# Patient Record
Sex: Female | Born: 1975 | Race: White | Hispanic: No
Health system: Midwestern US, Community
[De-identification: ages and names within clinical notes are randomized; demographics above are authoritative.]

## PROBLEM LIST (undated history)

## (undated) ENCOUNTER — Ambulatory Visit: Source: Home / Self Care

## (undated) DIAGNOSIS — R928 Other abnormal and inconclusive findings on diagnostic imaging of breast: Secondary | ICD-10-CM

## (undated) DIAGNOSIS — Z1239 Encounter for other screening for malignant neoplasm of breast: Secondary | ICD-10-CM

## (undated) DIAGNOSIS — N63 Unspecified lump in unspecified breast: Secondary | ICD-10-CM

## (undated) DIAGNOSIS — Z139 Encounter for screening, unspecified: Secondary | ICD-10-CM

## (undated) DIAGNOSIS — S92253A Displaced fracture of navicular [scaphoid] of unspecified foot, initial encounter for closed fracture: Secondary | ICD-10-CM

## (undated) DIAGNOSIS — Z1231 Encounter for screening mammogram for malignant neoplasm of breast: Secondary | ICD-10-CM

## (undated) DIAGNOSIS — R0602 Shortness of breath: Secondary | ICD-10-CM

## (undated) DIAGNOSIS — F32A Depression, unspecified: Secondary | ICD-10-CM

## (undated) DIAGNOSIS — G250 Essential tremor: Secondary | ICD-10-CM

## (undated) DIAGNOSIS — O24419 Gestational diabetes mellitus in pregnancy, unspecified control: Secondary | ICD-10-CM

## (undated) DIAGNOSIS — F419 Anxiety disorder, unspecified: Secondary | ICD-10-CM

## (undated) DIAGNOSIS — F329 Major depressive disorder, single episode, unspecified: Secondary | ICD-10-CM

## (undated) DIAGNOSIS — E559 Vitamin D deficiency, unspecified: Secondary | ICD-10-CM

## (undated) DIAGNOSIS — R5383 Other fatigue: Secondary | ICD-10-CM

## (undated) DIAGNOSIS — M2550 Pain in unspecified joint: Secondary | ICD-10-CM

## (undated) DIAGNOSIS — G43909 Migraine, unspecified, not intractable, without status migrainosus: Secondary | ICD-10-CM

## (undated) DIAGNOSIS — T7840XA Allergy, unspecified, initial encounter: Secondary | ICD-10-CM

## (undated) DIAGNOSIS — M654 Radial styloid tenosynovitis [de Quervain]: Secondary | ICD-10-CM

## (undated) DIAGNOSIS — S92909A Unspecified fracture of unspecified foot, initial encounter for closed fracture: Secondary | ICD-10-CM

## (undated) DIAGNOSIS — J45909 Unspecified asthma, uncomplicated: Secondary | ICD-10-CM

## (undated) HISTORY — DX: Pain in unspecified joint: M25.50

## (undated) HISTORY — DX: Other fatigue: R53.83

## (undated) HISTORY — DX: Unspecified asthma, uncomplicated: J45.909

## (undated) HISTORY — DX: Radial styloid tenosynovitis (de quervain): M65.4

## (undated) HISTORY — DX: Essential tremor: G25.0

## (undated) HISTORY — DX: Migraine, unspecified, not intractable, without status migrainosus: G43.909

## (undated) HISTORY — DX: Gestational diabetes mellitus in pregnancy, unspecified control: O24.419

## (undated) HISTORY — PX: TUBAL LIGATION: SHX77

## (undated) HISTORY — DX: Vitamin D deficiency, unspecified: E55.9

## (undated) HISTORY — DX: Anxiety disorder, unspecified: F41.9

## (undated) HISTORY — DX: Shortness of breath: R06.02

## (undated) HISTORY — PX: KNEE SURGERY: SHX244

## (undated) HISTORY — DX: Depression, unspecified: F32.A

## (undated) HISTORY — PX: WISDOM TOOTH EXTRACTION: SHX21

## (undated) HISTORY — DX: Major depressive disorder, single episode, unspecified: F32.9

## (undated) HISTORY — DX: Allergy, unspecified, initial encounter: T78.40XA

## (undated) HISTORY — DX: Unspecified fracture of unspecified foot, initial encounter for closed fracture: S92.909A

---

## 2000-07-08 ENCOUNTER — Other Ambulatory Visit: Admission: RE | Admit: 2000-07-08 | Discharge: 2000-07-08 | Payer: Self-pay | Admitting: Obstetrics and Gynecology

## 2000-08-30 ENCOUNTER — Inpatient Hospital Stay (HOSPITAL_COMMUNITY): Admission: AD | Admit: 2000-08-30 | Discharge: 2000-08-30 | Payer: Self-pay | Admitting: Obstetrics & Gynecology

## 2000-09-09 ENCOUNTER — Inpatient Hospital Stay (HOSPITAL_COMMUNITY): Admission: AD | Admit: 2000-09-09 | Discharge: 2000-09-14 | Payer: Self-pay | Admitting: Obstetrics & Gynecology

## 2000-09-20 ENCOUNTER — Inpatient Hospital Stay (HOSPITAL_COMMUNITY): Admission: AD | Admit: 2000-09-20 | Discharge: 2000-09-20 | Payer: Self-pay | Admitting: Obstetrics & Gynecology

## 2001-10-04 ENCOUNTER — Encounter: Admission: RE | Admit: 2001-10-04 | Discharge: 2001-10-04 | Payer: Self-pay | Admitting: Obstetrics and Gynecology

## 2001-12-15 ENCOUNTER — Inpatient Hospital Stay (HOSPITAL_COMMUNITY): Admission: AD | Admit: 2001-12-15 | Discharge: 2001-12-18 | Payer: Self-pay | Admitting: Obstetrics and Gynecology

## 2001-12-15 ENCOUNTER — Encounter (INDEPENDENT_AMBULATORY_CARE_PROVIDER_SITE_OTHER): Payer: Self-pay

## 2003-01-24 ENCOUNTER — Other Ambulatory Visit: Admission: RE | Admit: 2003-01-24 | Discharge: 2003-01-24 | Payer: Self-pay | Admitting: Obstetrics and Gynecology

## 2004-10-23 ENCOUNTER — Other Ambulatory Visit: Admission: RE | Admit: 2004-10-23 | Discharge: 2004-10-23 | Payer: Self-pay | Admitting: Obstetrics and Gynecology

## 2005-11-13 ENCOUNTER — Other Ambulatory Visit: Admission: RE | Admit: 2005-11-13 | Discharge: 2005-11-13 | Payer: Self-pay | Admitting: Obstetrics and Gynecology

## 2007-07-24 ENCOUNTER — Emergency Department (HOSPITAL_COMMUNITY): Admission: EM | Admit: 2007-07-24 | Discharge: 2007-07-24 | Payer: Self-pay | Admitting: Emergency Medicine

## 2011-04-11 NOTE — Discharge Summary (Signed)
[facility] of [facility]  Patient:    LANIAH, GRIMM Visit Number: [id number] MRN: [id number]          Service Type: OBS Location: 910A [id number] [id number] Attending Physician:  Melony Overly Dictated by:   Caralyn Guile Arlyce Dice, M.D. Admit Date:  12/15/2001 Discharge Date: 12/18/2001                             Discharge Summary  FINAL DIAGNOSES:              1. Intrauterine gestation at 38 weeks and                                  6 days.                               2. History of previous cesarean section with                                  desire for repeat cesarean section.                               3. Desire for permanent sterilization.                               4. Gestational diabetes.  SECONDARY DIAGNOSES:          None.  PROCEDURES:                   1. Repeat low transverse cesarean section.                               2. Bilateral tubal ligation.  COMPLICATIONS:                None.  CONDITION ON DISCHARGE:       Improved.  HISTORY OF PRESENT ILLNESS:   This is a 35 year old, gravida 2, para 1, who had a previous cesarean section for failure to progress.  The option of vaginal birth after cesarean section was discussed with the patient, who elected to proceed with the repeat cesarean section and also requested that permanent sterilization be performed at that time.  The patients antepartum course was complicated by gestational diabetes, but this was well controlled with diet.  HOSPITAL COURSE:              The patient was brought to the operating room on the day of admission by [facility] A. Edward Jolly, M.D., where a repeat low transverse cesarean section and bilateral partial salpingectomy for sterilization were performed without complication.  An 8 pound 6 ounce female infant with Apgar scores of 8 and 9 was delivered.  The patients postoperative course was benign without significant fever or anemia.  On the third postoperative day, the patient  was ambulating well, was without problems, and was felt to be ready for discharge.  DIET:                         She was discharged on a regular diet.  ACTIVITY:                     Told to limit her activity.  DISCHARGE MEDICATIONS:        She was given Tylox 30 tablets to take one to two every four hours for pain.  Asked to take her prenatal vitamins.  FOLLOW-UP:                    To return to the office in four weeks for her follow-up evaluation.  LABORATORY DATA:              Admission 10.0 with a white count of 8900. Discharge hemoglobin 7.5 with a white count of 12.9. Dictated by:   Caralyn Guile Arlyce Dice, M.D. Attending Physician:  Melony Overly DD:  01/03/02 TD:  01/03/02 Job: [id number]

## 2011-04-11 NOTE — Discharge Summary (Signed)
[facility] of [facility]  Patient:    Sarah Fields, Sarah Fields                   MRN: [id number] Adm. Date:  [id number] Disc. Date: [id number] Attending:  Mickle Mallory Dictator:   Leilani Able, P.A.                           Discharge Summary  FINAL DIAGNOSES:              1. Intrauterine pregnancy at 41-6/[redacted] weeks                                  gestation.                               2. Failure to progress.  PROCEDURE:                    Primary low transverse cesarean section.  SURGEON:                      Brook A. Edward Jolly, M.D.  COMPLICATIONS:                None.  HOSPITAL COURSE:              This 35 year old G1, P0 presents at 41-6/[redacted] weeks gestation for induction secondary to post dates status.  The patient had had nonstress test and fluid checks in the office.  They have all been normal. The patient was admitted on September 09, 2000.  At this time she received a dose of Cytotec which was followed by low dose Pitocin.  Upon admission, the patients cervix was only fingertip dilated and -2 to -3 station. The patient went on to develop an adequate labor pattern but she did not change her cervix with only 5 cm dilated, 90% effaced and -3 station. At this point there was recurrent bradycardia during her labor which was attributed to hypotension treated with ephedrine.  A discussion was made with the patient regarding her failure to progress. At this point it was recommended to proceed with a cesarean section.  The patient was taken to the operating room by Dr. Conley Simmonds where a primary low transverse cesarean section was performed with the delivery of an 8 pound 2 ounce female infant with Apgars of 8 and 9.  Delivery went without complications.  The patients postoperative course was benign without significant fevers. The patient was having some problems with breast-feeding and therefore was kept until postoperative day #4.  By postoperative day #4 she  was feeling a lot more comfortable with the nursing.  DISPOSITION:                  She was sent home on a regular diet, told to decrease activities, told to continue prenatal vitamins, was given FESO4 325 mg one b.i.d. and Tylox #30 one every four hours as needed for pain.  She was also told to use Motrin as needed and to follow up in the office in four weeks. DD:  11/02/00 TD:  11/02/00 Job: [id number]

## 2011-04-11 NOTE — Op Note (Signed)
[facility] of [facility]  Patient:    Sarah Fields, Sarah Fields Visit Number: [id number] MRN: [id number]          Service Type: OBS Location: [facility] Attending Physician:  Melony Overly Dictated by:   Devoria Albe Edward Jolly, M.D. Proc. Date: 12/15/01                             Operative Report  PREOPERATIVE DIAGNOSES:       1. Intrauterine gestation at 38+6 weeks.                               2. History of cesarean section, desire for                                  repeat cesarean section.                               3. Desire for permanent sterilization.                               4. Gestational diabetes mellitus.  POSTOPERATIVE DIAGNOSES:      1. Intrauterine gestation at 38+6 weeks.                               2. History of cesarean section, desire for                                  repeat cesarean section.                               3. Desire for permanent sterilization.                               4. Gestational diabetes mellitus.  PROCEDURE:                   Repeat low segment transverse cesarean section with bilateral tubal ligation.  SURGEON:                      Brook A. Edward Jolly, M.D.  ANESTHESIA:                   Spinal.  IV FLUIDS:                    3100 cc Ringers lactate.  ESTIMATED BLOOD LOSS:         850 cc.  URINE OUTPUT:                 200 cc.  COMPLICATIONS:                None.  INDICATIONS FOR PROCEDURE:    The patient was a 35 year old gravida 2, para 1-0-0-1 Caucasian female with a history of a prior cesarean section in 2001 for failure to progress and who, during her current pregnancy, expressed an interest in having a repeat cesarean section and permanent sterilization performed.  The patients antepartum course had been significant for gestational diabetes, which was diet controlled.  The patient chose to proceed with her surgery after the risks, benefits and alternatives were discussed with her.  The patient was quoted a  failure rate of approximately 7 per 1000 for the tubal ligation and she was told that this may result in either an intrauterine or an ectopic pregnancy.  FINDINGS:                     A viable female infant was delivered at 9:44 a.m. with Apgars of 9 at one minute and 9 at five minutes.  Weight was noted to be 8 lb 6 oz.  Amniotic fluid was noted to be clear.  The newborn had no gross abnormalities appreciated.  The bilateral tubes and ovaries were normal, as was the uterus.  The placenta had a normal insertion of a three-vessel cord.  SPECIMENS:                    Portions of the right and left fallopian tubes were sent to pathology.  DESCRIPTION OF PROCEDURE:     With an IV in place, the patient was taken to the operating room after she was properly identified.  The patient received a spinal anesthetic and was then placed in the supine position with a left lateral tilt.  The patients abdomen was sterilely prepped and a Foley catheter was sterilely placed inside the bladder.  She was then sterilely draped.  After adequate anesthesia was ensured, a Pfannenstiel incision was created sharply with a scalpel.  The incision was carried down through the subcutaneous tissue using monopolar cautery.  Small bleeding vessels were cauterized with monopolar cautery instrument.  The fascia was then scored in the midline with a scalpel and the incision was carried out bilaterally with Mayo scissors.   The fascia was separated from the underlying rectus muscles using sharp dissection with Mayo scissors.  The rectus muscles were then separated in the midline with a hemostat clamp and the incision was extended with Mayo scissors inferiorly.  The peritoneum was then grasped with two hemostat clamps and it was entered sharply with Metzenbaum scissors.  The incision was extended cranially and caudally using the same.  The lower uterine segment was exposed with a bladder retractor and a bladder flap was  created sharply with Metzenbaum scissors.  A transverse lower uterine segment incision was then created with a scalpel and the uterine cavity was then entered bluntly using a Kelly clamp.  The inch was then extended bluntly and an Allis clamp was used to rupture membranes.  A hand was then inserted through the uterine incision and the vertex was noted to be in the occiput posterior position.  The vertex was delivered, followed by the remainder of the infant, at which time the nares and mouth were suctioned.  The cord was then doubly clamped and cut and the newborn was carried over to the awaiting pediatricians.  Cord blood was obtained and the placenta was then manually extracted.  The patient then received clindamycin 900 mg intravenously and Pitocin 20 units IV.  The uterus was exteriorized for closure.  A moistened lap pad was used to remove any remaining products of conception from within the uterine cavity, and there were none.  The uterus was closed with a single running locked suture of #1 chromic.  Hemostasis was excellent.  A tubal ligation was performed at this time.  The  left fallopian tube was grasped with a Babcock clamp and followed all the way to the fimbriated end. A double tie of 0 plain was then placed around a knuckle of tissue and the intervening portion was sharply excised and sent to pathology.  Hemostasis was excellent.  The same procedure that was performed on the patients left-hand side was then repeated on the right fallopian tube after it was followed to its fimbriated end.  The uterus was then returned to the peritoneal cavity and the operative sites were reexamined and found to be hemostatic.  The peritoneum was closed with a running suture of #3 plain.  The rectus muscles were brought together in the midline using interrupted sutures of #1 chromic.  The fascia was closed with a running suture of 0 Vicryl.  The subcutaneous tissue was irrigated with  normal saline and was then suctioned.  The subcutaneous tissue was then cauterized in areas of small oozing vessels and hemostasis was excellent.  Interrupted  sutures of 3-0 plain were placed in the subcutaneous layer, followed by staples on the skin.  A sterile bandage was placed over this.  The uterus was expressed of remaining clots and the patient was cleansed of the remaining Betadine.  She was escorted to the recovery room in stable and awake condition.  There were no complications to the procedure.  All sponge, needle and instrument counts were correct. Dictated by:   Devoria Albe Edward Jolly, M.D. Attending Physician:  Melony Overly DD:  12/15/01 TD:  12/16/01 Job: [id number]

## 2011-04-11 NOTE — Op Note (Signed)
[facility] of [facility]  Patient:    GENIYAH, EISCHEID                   MRN: [id number] Proc. Date: 09/10/00 Adm. Date:  [id number] Attending:  Mickle Mallory                           Operative Report  PREOPERATIVE DIAGNOSIS:       Intrauterine pregnancy at 41 plus [redacted] weeks                               gestation, failure to progress.  POSTOPERATIVE DIAGNOSIS:      Intrauterine pregnancy at 41 plus [redacted] weeks                               gestation, failure to progress.  OPERATION:                    Primary low segment transverse cesarean section.  SURGEON:                      Brook A. Edward Jolly, M.D.  ANESTHESIA:                   Epidural.  IV FLUIDS:                    1700 cc of Ringers lactate.  ESTIMATED BLOOD LOSS:         750 cc.  URINE OUTPUT:                 350 cc.  COMPLICATIONS:                None.  INDICATIONS FOR PROCEDURE:    The patient was a 35 year old gravida 1, para 0, at 51 plus [redacted] weeks gestation ([facility] August 28, 2000) by last menstrual period, who was scheduled for an induction based on her post dates status.  The patient has been followed with NSTs and fluid checks in the office, and the fetal testing was reassuring with an amniotic fluid level of 10.3 on September 07, 2000.  The patient was admitted on September 09, 2000, at which time, she received a dose of Cytotec which was followed by low dose Pitocin.  The cervix at that time was fingertip and with the vertex at the minus 2 to 3 station. The fetal heart rate tracing was reassuring.                                The patient went on to develop adequate labor, but she did not change her cervix beyond 5 cm in dilatation with 90% effacement, and the vertex at the minus 3 station.  The patient did have recurrent bradycardia during her labor which was attributed to hypotension treated with ephedrine.  A discussion was held with the patient regarding the failure to progress  despite adequate labor monitored by an IUPC with adequate amount of the daily units, and the recommendation was to proceed with a primary low segment transverse cesarean section after the risks and benefits were reviewed with the patient and her mother.  They chose to proceed.  FINDINGS:  A viable female infant was born at 37 with Apgars of 8 at one minute and 9 at five minutes.  The weight was 8 pounds and 2 ounces, and two nuchal cords were noted.  One nuchal cord was tight and the other was loose.  The placenta had a normal insertion of a three vessel cord and was noted to be intact.  The uterus, tubes, and ovaries were normal.  A cord pH was measured at 7.28 which was prompted by a variable deceleration to 90s and 100s, just prior to creation of the incision on the skin.  DESCRIPTION OF PROCEDURE:     With an IV, epidural catheter, and a Foley catheter in place, the patient was escorted to the operating room suite from her labor and delivery room.  The patient was dosed through her epidural catheter and was found to have adequate anesthesia.  She was placed in the supine position.  The fetal heart rate was checked and was noted to be in the 90-100 range.  The patients abdomen was rapidly prepped and draped, and a Pfannenstiel incision was created sharply with a scalpel.  This was carried down to the fascia using the same.  The fascia was then scored in the midline with the scalpel, and the incision was carried out bilaterally with the Mayo scissors.  The rectus muscles were separated from the overlying fascia using sharp dissection with the Mayo scissors, and the peritoneum was entered sharply with the Metzenbaum scissors after it was elevated with two snap clamps.  The peritoneal incision was extended superiorly and inferiorly using the Metzenbaum scissors, and the bladder retractor was placed over the bladder.  A bladder flap was created sharply with the Metzenbaum  scissors, and the uterus was incised transversely with the scalpel.  The incision was extended bilaterally in an upward fashion with a bandaged scissors.  A hand was inserted through the uterine incision, and the vertex was delivered without difficulty.  The nuchal cord x 2 was reduced, and the remainder of the infant was delivered.  The cord was doubly clamped and cut after the nares and mouth were suctioned of amniotic fluid.  The infant was carried over to the awaiting pediatricians.  He was noted to be vigorous at birth.                                A cord pH and cord blood was obtained at this time, and the placenta was then manually extracted.  The patient received clindamycin 900 mg IV and Pitocin 20 mg IV.  A moistened lap pad was used to remove any remaining membranes from within the uterine cavity, and the uterus was closed with a single layer of a running locked suture of #1 chromic. There was some additional bleeding in the midportion of the incision which responded well to a figure-of-eight suture.  The uterus, which had been exteriorized, was then returned to the peritoneal cavity which was irrigated and suctioned of crystalloid solution.  The incision was reexamined along the uterus, and it was found to be hemostatic.  The fascial incisions were then examined, and there was no evidence of any ongoing bleeding noted.  The fascia was therefore closed with a running suture of 0 Vicryl.  The subcutaneous tissue was irrigated and suctioned of remaining fluid, and the subcutaneous layer was closed with interrupted sutures of 3-0 plain.  The skin was closed with staples.  A sterile bandage was placed over the incision and the uterus was expressed of any remaining clots.                                The patient was then escorted to the recovery room in stable and awake condition.  There were no complications to the procedure.  All needle, instrument, and sponge counts were  correct. DD:  09/10/00 TD:  09/11/00 Job: [id number]

## 2011-06-11 ENCOUNTER — Other Ambulatory Visit: Payer: Self-pay | Admitting: Family Medicine

## 2011-06-11 ENCOUNTER — Ambulatory Visit
Admission: RE | Admit: 2011-06-11 | Discharge: 2011-06-11 | Disposition: A | Discharge status: Home / Self Care | Payer: BC Managed Care – PPO | Source: Ambulatory Visit | Attending: Family Medicine | Admitting: Family Medicine

## 2011-06-11 DIAGNOSIS — R059 Cough, unspecified: Secondary | ICD-10-CM

## 2011-06-11 DIAGNOSIS — R05 Cough: Secondary | ICD-10-CM

## 2012-05-18 ENCOUNTER — Encounter

## 2012-10-02 ENCOUNTER — Ambulatory Visit (INDEPENDENT_AMBULATORY_CARE_PROVIDER_SITE_OTHER): Payer: BC Managed Care – PPO | Admitting: Physician Assistant

## 2012-10-02 VITALS — BP 132/78 | HR 77 | Temp 98.1°F | Resp 18 | Ht 63.0 in | Wt 247.0 lb

## 2012-10-02 DIAGNOSIS — G43909 Migraine, unspecified, not intractable, without status migrainosus: Secondary | ICD-10-CM

## 2012-10-02 DIAGNOSIS — R112 Nausea with vomiting, unspecified: Secondary | ICD-10-CM

## 2012-10-02 MED ORDER — KETOROLAC TROMETHAMINE 60 MG/2ML IM SOLN
60.0000 mg | Freq: Once | INTRAMUSCULAR | Status: AC
  Administered 2012-10-02: 60 mg via INTRAMUSCULAR

## 2012-10-02 MED ORDER — PROMETHAZINE HCL 25 MG/ML IJ SOLN
25.0000 mg | Freq: Once | INTRAMUSCULAR | Status: AC
  Administered 2012-10-02: 25 mg via INTRAMUSCULAR

## 2012-10-02 MED ORDER — ONDANSETRON 8 MG PO TBDP: 8.0000 mg | ORAL_TABLET | Freq: Three times a day (TID) | ORAL | Status: DC | PRN

## 2012-10-02 NOTE — Progress Notes (Signed)
  Subjective:    Patient ID: Sarah Fields, female    DOB: [birth year], 36 y.o.   MRN: [id number]  HPI 36 year old female presents with migraine headache. States symptoms started today - she took 1 dose of Maxalt that she subsequently threw up.  Headache has persisted and does feel worse now.  Admits that this feels like her typical migraines but slightly more severe.  No vision changes, dizziness, chest pain, SOB, paresthesias, weakness or syncope.  Does have nausea with vomiting, photophobia, and phonophobia.      Review of Systems  Constitutional: Negative for fever and chills.  HENT: Negative for neck pain.   Eyes: Negative for visual disturbance.  Respiratory: Negative for cough, shortness of breath and wheezing.   Cardiovascular: Negative for chest pain.  Gastrointestinal: Positive for nausea and vomiting.  Skin: Negative for rash.  Neurological: Negative for dizziness, syncope, weakness, light-headedness and numbness.  All other systems reviewed and are negative.       Objective:   Physical Exam  Constitutional: She is oriented to person, place, and time. She appears well-developed and well-nourished.  HENT:  Head: Normocephalic and atraumatic.  Right Ear: External ear normal.  Left Ear: External ear normal.  Eyes: Conjunctivae normal and EOM are normal. Pupils are equal, round, and reactive to light.  Neck: Normal range of motion. Neck supple.  Cardiovascular: Normal rate, regular rhythm and normal heart sounds.   Pulmonary/Chest: Effort normal and breath sounds normal.  Musculoskeletal: Normal range of motion.  Lymphadenopathy:    She has no cervical adenopathy.  Neurological: She is alert and oriented to person, place, and time. No cranial nerve deficit or sensory deficit. Coordination normal.  Psychiatric: She has a normal mood and affect. Her behavior is normal. Judgment and thought content normal.          Assessment & Plan:   1. Nausea with vomiting   ondansetron (ZOFRAN-ODT) 8 MG disintegrating tablet, promethazine (PHENERGAN) injection 25 mg  2. Migraine  ketorolac (TORADOL) injection 60 mg   Toradol and phenergan given today in office Zofran prn nausea  Maxalt if needed Follow up here or with PCP or go to ER with any worsening symptoms, or if she fail to improve.

## 2014-01-17 ENCOUNTER — Encounter

## 2014-02-03 ENCOUNTER — Encounter

## 2014-02-04 ENCOUNTER — Ambulatory Visit (INDEPENDENT_AMBULATORY_CARE_PROVIDER_SITE_OTHER): Payer: BC Managed Care – PPO | Admitting: Emergency Medicine

## 2014-02-04 VITALS — BP 118/80 | HR 103 | Temp 98.3°F | Resp 18 | Ht 65.0 in | Wt 274.0 lb

## 2014-02-04 DIAGNOSIS — J111 Influenza due to unidentified influenza virus with other respiratory manifestations: Secondary | ICD-10-CM

## 2014-02-04 MED ORDER — PROMETHAZINE-CODEINE 6.25-10 MG/5ML PO SYRP: 5.0000 mL | ORAL_SOLUTION | Freq: Four times a day (QID) | ORAL | Status: DC | PRN

## 2014-02-04 MED ORDER — PSEUDOEPHEDRINE-GUAIFENESIN ER 60-600 MG PO TB12: 1.0000 | ORAL_TABLET | Freq: Two times a day (BID) | ORAL | Status: DC

## 2014-02-04 NOTE — Patient Instructions (Signed)

## 2014-02-04 NOTE — Progress Notes (Signed)
[facility] [address], [place] [id number] [phone]  Date:  02/04/2014   Name:  Sarah LinerJennifer R Fields   DOB:  Mar 28, 1976   MRN:  [id number]  PCP:  Allean FoundSMITH,CANDACE THIELE, MD    Chief Complaint: Sore Throat, Laryngitis, sinus congestion and Facial Pain   History of Present Illness:  Sarah LinerJennifer R Fields is a 38 y.o. very pleasant female patient who presents with the following:  Ill for over a week with nasal congestion and drainage that is primarily mucoid.  Has sinus pressure and post nasal drip.  Headache.  Has sore throat.  Fever and no chills. Malaise , myalgias and fatigue.  Has a non productive cough.  No wheezing or shortness of breath.  No nausea or vomiting.  No stool change or rash.  No improvement with over the counter medications or other home remedies. Denies other complaint or health concern today.   There are no active problems to display for this patient.   Past Medical History  Diagnosis Date  . Allergy   . Anxiety   . Asthma   . Depression     Past Surgical History  Procedure Laterality Date  . Cesarean section    . Knee surgery      History  Substance Use Topics  . Smoking status: Never Smoker   . Smokeless tobacco: Not on file  . Alcohol Use: No    Family History  Problem Relation Age of Onset  . Hyperlipidemia Mother   . Seizures Father   . Stroke Maternal Grandmother   . Cancer Maternal Grandfather   . Stroke Paternal Grandfather     Allergies  Allergen Reactions  . Latex   . Other     Pine apples, strawberries, banannas  . Penicillins     Medication list has been reviewed and updated.  Current Outpatient Prescriptions on File Prior to Visit  Medication Sig Dispense Refill  . albuterol (PROVENTIL HFA;VENTOLIN HFA) 108 (90 BASE) MCG/ACT inhaler Inhale 2 puffs into the lungs every 6 (six) hours as needed.      . ondansetron (ZOFRAN-ODT) 8 MG disintegrating tablet Take 1 tablet (8 mg total) by mouth every 8  (eight) hours as needed for nausea.  30 tablet  0  . rizatriptan (MAXALT) 10 MG tablet Take 10 mg by mouth as needed. May repeat in 2 hours if needed      . FLUoxetine (PROZAC) 10 MG capsule Take 10 mg by mouth daily.       No current facility-administered medications on file prior to visit.    Review of Systems:  As per HPI, otherwise negative.    Physical Examination: Filed Vitals:   02/04/14 1439  BP: 118/80  Pulse: 103  Temp: 98.3 F (36.8 C)  Resp: 18   Filed Vitals:   02/04/14 1439  Height: 5\' 5"  (1.651 m)  Weight: 274 lb (124.286 kg)   Body mass index is 45.6 kg/(m^2). Ideal Body Weight: Weight in (lb) to have BMI = 25: 149.9  GEN: morbid obesity, NAD, Non-toxic, A & O x 3 HEENT: Atraumatic, Normocephalic. Neck supple. No masses, No LAD. Ears and Nose: No external deformity. CV: RRR, No M/G/R. No JVD. No thrill. No extra heart sounds. PULM: CTA B, no wheezes, crackles, rhonchi. No retractions. No resp. distress. No accessory muscle use. ABD: S, NT, ND, +BS. No rebound. No HSM. EXTR: No c/c/e NEURO Normal gait.  PSYCH: Normally interactive. Conversant. Not depressed or anxious appearing.  Calm demeanor.    Assessment and Plan: Influenza Too lat for tamiflu mucinex d Phen c cod   Signed,  Phillips Odor, MD

## 2014-05-10 ENCOUNTER — Telehealth: Payer: Self-pay | Admitting: Obstetrics and Gynecology

## 2014-05-10 NOTE — Telephone Encounter (Signed)
Confirming pts appt °

## 2014-05-15 NOTE — Telephone Encounter (Signed)
Lm with daughter to have pt call back

## 2014-05-18 ENCOUNTER — Encounter: Payer: Self-pay | Admitting: Obstetrics and Gynecology

## 2014-05-18 ENCOUNTER — Ambulatory Visit (INDEPENDENT_AMBULATORY_CARE_PROVIDER_SITE_OTHER): Payer: BC Managed Care – PPO | Admitting: Obstetrics and Gynecology

## 2014-05-18 VITALS — BP 124/82 | HR 84 | Resp 14 | Ht 64.75 in | Wt 279.0 lb

## 2014-05-18 DIAGNOSIS — Z01419 Encounter for gynecological examination (general) (routine) without abnormal findings: Secondary | ICD-10-CM

## 2014-05-18 DIAGNOSIS — Z0000 Encounter for general adult medical examination without abnormal findings: Secondary | ICD-10-CM

## 2014-05-18 DIAGNOSIS — N920 Excessive and frequent menstruation with regular cycle: Secondary | ICD-10-CM

## 2014-05-18 LAB — COMPREHENSIVE METABOLIC PANEL
ALT: 9 U/L (ref 0–35)
AST: 13 U/L (ref 0–37)
Albumin: 4 g/dL (ref 3.5–5.2)
Alkaline Phosphatase: 85 U/L (ref 39–117)
BILIRUBIN TOTAL: 0.4 mg/dL (ref 0.2–1.2)
BUN: 10 mg/dL (ref 6–23)
CALCIUM: 9 mg/dL (ref 8.4–10.5)
CHLORIDE: 103 meq/L (ref 96–112)
CO2: 26 meq/L (ref 19–32)
CREATININE: 0.75 mg/dL (ref 0.50–1.10)
Glucose, Bld: 82 mg/dL (ref 70–99)
Potassium: 4.6 mEq/L (ref 3.5–5.3)
SODIUM: 141 meq/L (ref 135–145)
TOTAL PROTEIN: 6.6 g/dL (ref 6.0–8.3)

## 2014-05-18 LAB — CBC
HCT: 39.1 % (ref 36.0–46.0)
Hemoglobin: 12.9 g/dL (ref 12.0–15.0)
MCH: 29.1 pg (ref 26.0–34.0)
MCHC: 33 g/dL (ref 30.0–36.0)
MCV: 88.3 fL (ref 78.0–100.0)
PLATELETS: 276 10*3/uL (ref 150–400)
RBC: 4.43 MIL/uL (ref 3.87–5.11)
RDW: 13.5 % (ref 11.5–15.5)
WBC: 9.4 10*3/uL (ref 4.0–10.5)

## 2014-05-18 LAB — POCT URINALYSIS DIPSTICK
Bilirubin, UA: NEGATIVE
Glucose, UA: NEGATIVE
Ketones, UA: NEGATIVE
Leukocytes, UA: NEGATIVE
NITRITE UA: NEGATIVE
PH UA: 5
PROTEIN UA: NEGATIVE
RBC UA: NEGATIVE
UROBILINOGEN UA: NEGATIVE

## 2014-05-18 LAB — LIPID PANEL
CHOL/HDL RATIO: 4.4 ratio
Cholesterol: 171 mg/dL (ref 0–200)
HDL: 39 mg/dL — ABNORMAL LOW (ref >39)
LDL CALC: 83 mg/dL (ref 0–99)
Triglycerides: 245 mg/dL — ABNORMAL HIGH (ref <150)
VLDL: 49 mg/dL — AB (ref 0–40)

## 2014-05-18 LAB — HEMOGLOBIN, FINGERSTICK: Hemoglobin, fingerstick: 12.9 g/dL (ref 12.0–16.0)

## 2014-05-18 NOTE — Progress Notes (Signed)
Patient ID: Sarah LinerJennifer R Fields, female   DOB: [birth year], 38 y.o.   MRN: [id number] GYNECOLOGY VISIT  PCP:  Serita ShellerVyvyaan Sun, MD  Referring provider:   HPI: 38 y.o.   Married  Caucasian  female   G2P2002 with Patient's last menstrual period was 05/08/2014.   here for  AEX.  Notes something near anal opening.  Worsens with straining to have BMs.  Heavy but regular menses.  Back pain for first two days.  Pad and tampon use with change every 1.5 - 2 hours.  Bleeding through clothing at work. Clotting.  No known fibroids.   Took birth control in the past without problems.   Has migraine with aura.   Hgb:    12.9 Urine:  Neg  GYNECOLOGIC HISTORY: Patient's last menstrual period was 05/08/2014. Sexually active:  yes Partner preference: female Contraception:   Tubal Menopausal hormone therapy: n/a DES exposure:   no Blood transfusions:  no  Sexually transmitted diseases:   no GYN procedures and prior surgeries:  C-section, Tubal Last mammogram:  n/a               Last pap and high risk HPV testing:  2012 wnl  History of abnormal pap smear:  2000 had colposcopy but no treatment to cervix.  Repeat pap normal.  Paps normal since.   OB History   Grav Para Term Preterm Abortions TAB SAB Ect Mult Living   2 2 2       2        LIFESTYLE: Exercise:  no             Tobacco:  no Alcohol:   no Drug use:  no  OTHER HEALTH MAINTENANCE: Tetanus/TDap:  Up to date with PCP Gardisil:               n/a Influenza:            08/2013 Zostavax:             n/a  Bone density:       n/a Colonoscopy:       n/a  Cholesterol check:   Not lately  Family History  Problem Relation Age of Onset  . Hyperlipidemia Mother   . Migraines Mother   . Thyroid disease Mother   . Seizures Father   . Hypertension Father   . Stroke Maternal Grandmother   . Cancer Maternal Grandfather   . Migraines Maternal Grandfather   . Stroke Paternal Grandfather     There are no active problems to display  for this patient.  Past Medical History  Diagnosis Date  . Allergy   . Anxiety   . Asthma   . Depression   . Migraines     Past Surgical History  Procedure Laterality Date  . Cesarean section    . Knee surgery    . Tubal ligation      ALLERGIES: Latex; Other; and Penicillins  Current Outpatient Prescriptions  Medication Sig Dispense Refill  . albuterol (PROVENTIL HFA;VENTOLIN HFA) 108 (90 BASE) MCG/ACT inhaler Inhale 2 puffs into the lungs every 6 (six) hours as needed.      . Fluticasone-Salmeterol (ADVAIR DISKUS IN) Inhale into the lungs.      . montelukast (SINGULAIR) 10 MG tablet Take 10 mg by mouth at bedtime.      . rizatriptan (MAXALT) 10 MG tablet Take 10 mg by mouth as needed. May repeat in 2 hours if needed       No  current facility-administered medications for this visit.     ROS:  Pertinent items are noted in HPI.  SOCIAL HISTORY:  Married. Children are 112 daughter and 40 year old son.  PHYSICAL EXAMINATION:    BP 124/82  Pulse 84  Resp 14  Ht 5' 4.75" (1.645 m)  Wt 279 lb (126.554 kg)  BMI 46.77 kg/m2  LMP 05/08/2014   Wt Readings from Last 3 Encounters:  05/18/14 279 lb (126.554 kg)  02/04/14 274 lb (124.286 kg)  10/02/12 247 lb (112.038 kg)     Ht Readings from Last 3 Encounters:  05/18/14 5' 4.75" (1.645 m)  02/04/14 5\' 5"  (1.651 m)  10/02/12 5\' 3"  (1.6 m)    General appearance: alert, cooperative and appears stated age Head: Normocephalic, without obvious abnormality, atraumatic Neck: no adenopathy, supple, symmetrical, trachea midline and thyroid not enlarged, symmetric, no tenderness/mass/nodules Lungs: clear to auscultation bilaterally Breasts: Inspection negative, No nipple retraction or dimpling, No nipple discharge or bleeding, No axillary or supraclavicular adenopathy, Normal to palpation without dominant masses Heart: regular rate and rhythm Abdomen: soft, non-tender; no masses,  no organomegaly Extremities: extremities normal,  atraumatic, no cyanosis or edema Skin: Skin color, texture, turgor normal. No rashes or lesions Lymph nodes: Cervical, supraclavicular, and axillary nodes normal. No abnormal inguinal nodes palpated Neurologic: Grossly normal  Pelvic: External genitalia:  no lesions              Urethra:  normal appearing urethra with no masses, tenderness or lesions              Bartholins and Skenes: normal                 Vagina: normal appearing vagina with normal color and discharge, no lesions              Cervix: normal appearance              Pap and high risk HPV testing done: yes.            Bimanual Exam:  Uterus:  uterus is normal size, shape, consistency and nontender                                      Adnexa: normal adnexa in size, nontender and no masses                                      Rectovaginal: Confirms                                      Anus:  normal sphincter tone, no lesions  ASSESSMENT  Normal gynecologic exam. Menorrhagia.  History of migraines with aura.  Status post Cesarean Section.  Status post BTL Obesity.   PLAN  Mammogram recommended yearly at age 38.  Pap smear and high risk HPV testing performed.  Counseled on self breast exam, Calcium and vitamin D intake, exercise. Lipid profile, CMP, CBC, TSH.  Return for pelvic ultrasound, sonohysterogram, and endometrial biopsy.  Return annually or prn   An After Visit Summary was printed and given to the patient.

## 2014-05-18 NOTE — Patient Instructions (Signed)

## 2014-05-19 ENCOUNTER — Other Ambulatory Visit: Payer: Self-pay | Admitting: Obstetrics and Gynecology

## 2014-05-19 DIAGNOSIS — E785 Hyperlipidemia, unspecified: Secondary | ICD-10-CM

## 2014-05-19 LAB — TSH: TSH: 2.583 u[IU]/mL (ref 0.350–4.500)

## 2014-05-22 ENCOUNTER — Telehealth: Payer: Self-pay | Admitting: Obstetrics and Gynecology

## 2014-05-22 LAB — IPS PAP TEST WITH HPV

## 2014-05-22 NOTE — Telephone Encounter (Signed)
Female answered. Stated that patient was unavailable. Left my name and number for a call back.

## 2014-05-24 NOTE — Telephone Encounter (Signed)
Spoke with patient. Advised that per benefit quote received, she will be responsible for $25 copay [facility]. Patient agreeable. Scheduled procedures. Advised patient of 72 hour cancellation policy and $150 cancellation fee. Patient agreeable Mailed the In-Office procedure form that includes appointment date and time, patient copay, and cancellation policy

## 2014-05-25 NOTE — Telephone Encounter (Signed)
Pt wants to see if she can reschedule her ultrasound for 06/22/2014 if possible.

## 2014-05-29 ENCOUNTER — Telehealth: Payer: Self-pay

## 2014-05-29 NOTE — Telephone Encounter (Signed)
Pt notified of results.  Pt voices understanding and is agreeable with plan.  Lab appt for 08/28/14 for repeat lipid.  Pt also asked about rescheduling ultrasound appt.  [place] [facility] [place]  See separate phone note for details.

## 2014-05-29 NOTE — Telephone Encounter (Signed)
Message copied by Alphonsa OverallIXON, Kharee Lesesne L on Mon May 29, 2014  8:47 AM ------      Message from: [facility] Gwenevere GhaziARVALHO E SILVA, BROOK E      Created: Fri May 19, 2014  7:40 AM       Please inform of lab results.      CBC, CMP, TSH normal.            Cholesterol panel showed significantly elevated triglycerides and low good cholesterol.            I recommend a diet low in saturated fat and good cardio exercise most days of the week.             I recommend follow up fasting labs in 3 months.      Please have her make a lab appointment. ------

## 2014-05-29 NOTE — Telephone Encounter (Signed)
Rescheduled shgm at patients request

## 2014-05-29 NOTE — Telephone Encounter (Signed)
LMOVM at [phone] per DPR to call for lab results.

## 2014-06-15 ENCOUNTER — Other Ambulatory Visit: Payer: BC Managed Care – PPO | Admitting: Obstetrics and Gynecology

## 2014-06-15 ENCOUNTER — Other Ambulatory Visit: Payer: BC Managed Care – PPO

## 2014-06-22 ENCOUNTER — Ambulatory Visit (INDEPENDENT_AMBULATORY_CARE_PROVIDER_SITE_OTHER): Payer: BC Managed Care – PPO

## 2014-06-22 ENCOUNTER — Ambulatory Visit (INDEPENDENT_AMBULATORY_CARE_PROVIDER_SITE_OTHER): Payer: BC Managed Care – PPO | Admitting: Obstetrics and Gynecology

## 2014-06-22 ENCOUNTER — Encounter: Payer: Self-pay | Admitting: Obstetrics and Gynecology

## 2014-06-22 ENCOUNTER — Other Ambulatory Visit: Payer: Self-pay | Admitting: Obstetrics and Gynecology

## 2014-06-22 VITALS — BP 122/84 | HR 68 | Ht 64.75 in | Wt 274.0 lb

## 2014-06-22 DIAGNOSIS — N920 Excessive and frequent menstruation with regular cycle: Secondary | ICD-10-CM

## 2014-06-22 NOTE — Progress Notes (Signed)
Subjective  Patient is her for pelvic ultrasound for menorrhagia.  Status post BTL.  History of Cesarean Section.  History of migraines with aura.   Objective  Pelvic ultrasound - images reviewed with patient. Uterus - 15 mm posterior fibroid.  EMS 8.29 mm.  Ovaries normal.  Left ovary with 10 mm follicle.  No free fluid.     Procedure - sonohysterogram Consent performed. Speculum placed in vagina. Sterile prep of cervix with betadine. Cannula placed inside endometrial cavity without difficulty. Speculum removed. Sterile saline injected.   No           filling defect noted. Cesarean section scar noted.  Cannula removed. No complication.   Procedure - endometrial biopsy Consent performed. Speculum place in vagina.  Sterile prep of cervix with betadine.  Pipelle placed to     10     cm without difficulty twice. Tissue obtained and sent to pathology. Speculum removed.  No complications.  Assessment  Menorrhagia.  Status post Cesarean section.  Status post BTL.  Migraines with aura.  Obesity.   Plan  Follow up EMB results.  Return in 10 days for talking visit and planning for treatment.  I discussed options for care including hormonal contraception (without estrogen) such as Mirena IUD, endometrial ablation (I do not favor this.), or hysterectomy.  Ortho Micronor is also an option but was not mentioned.  Precautions following EMB given.   After visit summary to patient.

## 2014-06-22 NOTE — Patient Instructions (Signed)
Levonorgestrel intrauterine device (IUD) What is this medicine? LEVONORGESTREL IUD (LEE voe nor jes trel) is a contraceptive (birth control) device. The device is placed inside the uterus by a healthcare professional. It is used to prevent pregnancy and can also be used to treat heavy bleeding that occurs during your period. Depending on the device, it can be used for 3 to 5 years. This medicine may be used for other purposes; ask your health care provider or pharmacist if you have questions. COMMON BRAND NAME(S): LILETTA, Mirena, Skyla What should I tell my health care provider before I take this medicine? They need to know if you have any of these conditions: -abnormal Pap smear -cancer of the breast, uterus, or cervix -diabetes -endometritis -genital or pelvic infection now or in the past -have more than one sexual partner or your partner has more than one partner -heart disease -history of an ectopic or tubal pregnancy -immune system problems -IUD in place -liver disease or tumor -problems with blood clots or take blood-thinners -use intravenous drugs -uterus of unusual shape -vaginal bleeding that has not been explained -an unusual or allergic reaction to levonorgestrel, other hormones, silicone, or polyethylene, medicines, foods, dyes, or preservatives -pregnant or trying to get pregnant -breast-feeding How should I use this medicine? This device is placed inside the uterus by a health care professional. Talk to your pediatrician regarding the use of this medicine in children. Special care may be needed. Overdosage: If you think you have taken too much of this medicine contact a poison control center or emergency room at once. NOTE: This medicine is only for you. Do not share this medicine with others. What if I miss a dose? This does not apply. What may interact with this medicine? Do not take this medicine with any of the following  medications: -amprenavir -bosentan -fosamprenavir This medicine may also interact with the following medications: -aprepitant -barbiturate medicines for inducing sleep or treating seizures -bexarotene -griseofulvin -medicines to treat seizures like carbamazepine, ethotoin, felbamate, oxcarbazepine, phenytoin, topiramate -modafinil -pioglitazone -rifabutin -rifampin -rifapentine -some medicines to treat HIV infection like atazanavir, indinavir, lopinavir, nelfinavir, tipranavir, ritonavir -St. John's wort -warfarin This list may not describe all possible interactions. Give your health care provider a list of all the medicines, herbs, non-prescription drugs, or dietary supplements you use. Also tell them if you smoke, drink alcohol, or use illegal drugs. Some items may interact with your medicine. What should I watch for while using this medicine? Visit your doctor or health care professional for regular check ups. See your doctor if you or your partner has sexual contact with others, becomes HIV positive, or gets a sexual transmitted disease. This product does not protect you against HIV infection (AIDS) or other sexually transmitted diseases. You can check the placement of the IUD yourself by reaching up to the top of your vagina with clean fingers to feel the threads. Do not pull on the threads. It is a good habit to check placement after each menstrual period. Call your doctor right away if you feel more of the IUD than just the threads or if you cannot feel the threads at all. The IUD may come out by itself. You may become pregnant if the device comes out. If you notice that the IUD has come out use a backup birth control method like condoms and call your health care provider. Using tampons will not change the position of the IUD and are okay to use during your period. What side effects may   I notice from receiving this medicine? Side effects that you should report to your doctor or  health care professional as soon as possible: -allergic reactions like skin rash, itching or hives, swelling of the face, lips, or tongue -fever, flu-like symptoms -genital sores -high blood pressure -no menstrual period for 6 weeks during use -pain, swelling, warmth in the leg -pelvic pain or tenderness -severe or sudden headache -signs of pregnancy -stomach cramping -sudden shortness of breath -trouble with balance, talking, or walking -unusual vaginal bleeding, discharge -yellowing of the eyes or skin Side effects that usually do not require medical attention (report to your doctor or health care professional if they continue or are bothersome): -acne -breast pain -change in sex drive or performance -changes in weight -cramping, dizziness, or faintness while the device is being inserted -headache -irregular menstrual bleeding within first 3 to 6 months of use -nausea This list may not describe all possible side effects. Call your doctor for medical advice about side effects. You may report side effects to FDA at 1-800-FDA-1088. Where should I keep my medicine? This does not apply. NOTE: This sheet is a summary. It may not cover all possible information. If you have questions about this medicine, talk to your doctor, pharmacist, or health care provider.  2015, Elsevier/Gold Standard. (2011-12-11 13:54:04)  

## 2014-06-26 LAB — IPS OTHER TISSUE BIOPSY

## 2014-06-29 ENCOUNTER — Telehealth: Payer: Self-pay

## 2014-06-29 NOTE — Telephone Encounter (Signed)
Patient notified of results.

## 2014-06-29 NOTE — Telephone Encounter (Signed)
Called patient at [phone] [facility].

## 2014-06-29 NOTE — Telephone Encounter (Signed)
Message copied by Alphonsa OverallIXON, Dorann Davidson L on Thu Jun 29, 2014 10:18 AM ------      Message from: Ricki MillerAMUNDSON DE Gwenevere GhaziARVALHO E SILVA, BROOK E      Created: Tue Jun 27, 2014  7:16 PM       Please let patient know of her benign endometrial biopsy result.       She has an appointment on 07/06/14 to discuss treatment options for her heavy cycles.      I suggested a Mirena IUD at her visit the other day. ------

## 2014-07-06 ENCOUNTER — Encounter: Payer: Self-pay | Admitting: Obstetrics and Gynecology

## 2014-07-06 ENCOUNTER — Ambulatory Visit (INDEPENDENT_AMBULATORY_CARE_PROVIDER_SITE_OTHER): Payer: BC Managed Care – PPO | Admitting: Obstetrics and Gynecology

## 2014-07-06 VITALS — BP 130/76 | HR 90 | Resp 14 | Ht 64.75 in | Wt 277.2 lb

## 2014-07-06 DIAGNOSIS — N920 Excessive and frequent menstruation with regular cycle: Secondary | ICD-10-CM

## 2014-07-06 NOTE — Progress Notes (Signed)
Patient ID: Sarah Fields, female   DOB: 1976/08/19, 38 y.o.   MRN: [id number] GYNECOLOGY  VISIT   HPI: 38 y.o.   Married  Caucasian  female   G2P2002 with Patient's last menstrual period was 06/09/2014.   here for consultation to discuss treatment options for heavy menstrual cycles. Normal pelvic ultrasound, sonohysterogram, and endometrial biopsy.  Cannot toleratie OCPS due to headaches.  Has done Internet research about the Mirena IUD.   GYNECOLOGIC HISTORY: Patient's last menstrual period was 06/09/2014. Contraception:  Tubal ligation  Menopausal hormone therapy: n/a        OB History   Grav Para Term Preterm Abortions TAB SAB Ect Mult Living   2 2 2       2          Patient Active Problem List   Diagnosis Date Noted  . Menorrhagia 05/18/2014    Past Medical History  Diagnosis Date  . Allergy   . Anxiety   . Asthma   . Depression   . Migraines     Past Surgical History  Procedure Laterality Date  . Cesarean section    . Knee surgery    . Tubal ligation      Current Outpatient Prescriptions  Medication Sig Dispense Refill  . albuterol (PROVENTIL HFA;VENTOLIN HFA) 108 (90 BASE) MCG/ACT inhaler Inhale 2 puffs into the lungs every 6 (six) hours as needed.      . Fluticasone-Salmeterol (ADVAIR DISKUS IN) Inhale into the lungs.      . montelukast (SINGULAIR) 10 MG tablet Take 10 mg by mouth at bedtime.      . rizatriptan (MAXALT) 10 MG tablet Take 10 mg by mouth as needed. May repeat in 2 hours if needed       No current facility-administered medications for this visit.     ALLERGIES: Latex; Other; and Penicillins  Family History  Problem Relation Age of Onset  . Hyperlipidemia Mother   . Migraines Mother   . Thyroid disease Mother   . Seizures Father   . Hypertension Father   . Stroke Maternal Grandmother   . Cancer Maternal Grandfather   . Migraines Maternal Grandfather   . Stroke Paternal Grandfather     History   Social History  . Marital  Status: Married    Spouse Name: N/A    Number of Children: N/A  . Years of Education: N/A   Occupational History  . Not on file.   Social History Main Topics  . Smoking status: Never Smoker   . Smokeless tobacco: Not on file  . Alcohol Use: No  . Drug Use: Not on file  . Sexual Activity: Yes    Partners: Male    Birth Control/ Protection: Surgical     Comment: Tubal   Other Topics Concern  . Not on file   Social History Narrative  . No narrative on file    ROS:  Pertinent items are noted in HPI.  PHYSICAL EXAMINATION:    BP 130/76  Pulse 90  Resp 14  Ht 5' 4.75" (1.645 m)  Wt 277 lb 3.2 oz (125.737 kg)  BMI 46.47 kg/m2  LMP 06/09/2014     General appearance: alert, cooperative and appears stated age  ASSESSMENT  Menorrhagia. Status post BTL. History of Cesarean Section. History of migraines.  PLAN  Counseled on Mirena IUD, Micronor, Nexplanon, Depo Provera.  Mirena brochure to patient.  Desires Mirena IUD.  Will precert.   See labs:  No. Return for  IUD insertion during first 5 days of cycle.    An After Visit Summary was printed and given to the patient.  __25____ minutes face to face time of which over 50% was spent in counseling.

## 2014-07-06 NOTE — Patient Instructions (Signed)
We will call you after we have your insurance information for the Mirena UD.

## 2014-07-20 ENCOUNTER — Telehealth: Payer: Self-pay | Admitting: Obstetrics and Gynecology

## 2014-07-20 NOTE — Telephone Encounter (Signed)
Left message for patient to call back. Need to go over IUD benefits °

## 2014-07-24 NOTE — Telephone Encounter (Signed)
Left message for patient to call back  

## 2014-07-24 NOTE — Telephone Encounter (Signed)
Pt returning call

## 2014-07-26 NOTE — Telephone Encounter (Signed)
Returning a call to [facility]. °

## 2014-07-27 NOTE — Telephone Encounter (Signed)
Spoke with patient. Advised that per benefits quote received, IUD and insertion is covered at 100% at $50 copay. There will be $50 patient liability. Patient is to call within the first 5 days of her cycle to schedule insertion.

## 2014-08-07 ENCOUNTER — Encounter: Payer: Self-pay | Admitting: Obstetrics and Gynecology

## 2014-08-28 ENCOUNTER — Other Ambulatory Visit (INDEPENDENT_AMBULATORY_CARE_PROVIDER_SITE_OTHER): Payer: BC Managed Care – PPO

## 2014-08-28 DIAGNOSIS — E785 Hyperlipidemia, unspecified: Secondary | ICD-10-CM

## 2014-08-28 LAB — LIPID PANEL
Cholesterol: 150 mg/dL (ref 0–200)
HDL: 38 mg/dL — AB (ref >39)
LDL CALC: 65 mg/dL (ref 0–99)
TRIGLYCERIDES: 234 mg/dL — AB (ref <150)
Total CHOL/HDL Ratio: 3.9 Ratio
VLDL: 47 mg/dL — AB (ref 0–40)

## 2014-09-25 ENCOUNTER — Encounter: Payer: Self-pay | Admitting: Obstetrics and Gynecology

## 2015-01-31 ENCOUNTER — Telehealth: Payer: Self-pay | Admitting: Obstetrics and Gynecology

## 2015-01-31 NOTE — Telephone Encounter (Signed)
Patient returned call and asked that I pre-cert coverage for the mirena iud.  Advised patient that I would and that I would call her back with benefits/coverage. Patient agreeable.

## 2015-01-31 NOTE — Telephone Encounter (Signed)
Patient wanted to speak with Shanda BumpsJessica. She is aware she is not here this afternoon.Patient came in to bring new updated [employer] insurance card. Patient states she is having a procedure done and her new insurance needs to be checked first. The [employer] insurance has been scanned into chart.

## 2015-01-31 NOTE — Telephone Encounter (Signed)
Call to patient. No answer. Left message for patient to call back. °

## 2015-01-31 NOTE — Telephone Encounter (Signed)
Left message for patient to call back  

## 2015-02-15 NOTE — Telephone Encounter (Signed)
Left message for patient to call back  

## 2015-02-19 NOTE — Telephone Encounter (Signed)
Call to patient. No answer/no voicemail. Phone just continued to ring.

## 2015-04-03 ENCOUNTER — Encounter

## 2015-04-13 ENCOUNTER — Inpatient Hospital Stay: Admit: 2015-04-13 | Payer: PRIVATE HEALTH INSURANCE | Attending: Family Medicine | Primary: Family Medicine

## 2015-04-13 DIAGNOSIS — Z139 Encounter for screening, unspecified: Secondary | ICD-10-CM

## 2015-04-20 ENCOUNTER — Inpatient Hospital Stay
Admit: 2015-04-20 | Discharge: 2015-04-20 | Disposition: A | Discharge status: Home / Self Care | Payer: BLUE CROSS/BLUE SHIELD | Attending: Emergency Medicine

## 2015-04-20 DIAGNOSIS — J9801 Acute bronchospasm: Secondary | ICD-10-CM

## 2015-04-20 LAB — HCG URINE, QL. - POC: Pregnancy test,urine (POC): NEGATIVE

## 2015-04-20 LAB — URINALYSIS W/ REFLEX CULTURE
Bacteria: NEGATIVE /hpf
Bilirubin: NEGATIVE
Glucose: NEGATIVE mg/dL
Leukocyte Esterase: NEGATIVE
Nitrites: NEGATIVE
Protein: NEGATIVE mg/dL
Specific gravity: 1.021 (ref 1.003–1.030)
Urobilinogen: 0.2 EU/dL (ref 0.2–1.0)
pH (UA): 5.5 (ref 5.0–8.0)

## 2015-04-20 MED ORDER — IPRATROPIUM-ALBUTEROL 2.5 MG-0.5 MG/3 ML NEB SOLUTION
2.5 mg-0.5 mg/3 ml | RESPIRATORY_TRACT | Status: DC
Start: 2015-04-20 — End: 2015-04-20
  Administered 2015-04-20: 22:00:00 via RESPIRATORY_TRACT

## 2015-04-20 MED ORDER — PREDNISONE 20 MG TAB
20 mg | ORAL | Status: AC
Start: 2015-04-20 — End: 2015-04-20
  Administered 2015-04-20: 22:00:00 via ORAL

## 2015-04-20 MED ORDER — IPRATROPIUM-ALBUTEROL 2.5 MG-0.5 MG/3 ML NEB SOLUTION
2.5 mg-0.5 mg/3 ml | RESPIRATORY_TRACT | Status: AC
Start: 2015-04-20 — End: 2015-04-20
  Administered 2015-04-20: 22:00:00 via RESPIRATORY_TRACT

## 2015-04-20 MED ORDER — IPRATROPIUM-ALBUTEROL 2.5 MG-0.5 MG/3 ML NEB SOLUTION
2.5 mg-0.5 mg/3 ml | RESPIRATORY_TRACT | Status: AC
Start: 2015-04-20 — End: 2015-04-20
  Administered 2015-04-20: 23:00:00 via RESPIRATORY_TRACT

## 2015-04-20 MED ORDER — ACETAMINOPHEN 500 MG TAB
500 mg | ORAL | Status: AC
Start: 2015-04-20 — End: 2015-04-20
  Administered 2015-04-20: 22:00:00 via ORAL

## 2015-04-20 MED ORDER — PREDNISONE 20 MG TAB
20 mg | ORAL_TABLET | Freq: Every day | ORAL | Status: AC
Start: 2015-04-20 — End: 2015-04-25

## 2015-04-20 MED FILL — PREDNISONE 20 MG TAB: 20 mg | ORAL | Qty: 3

## 2015-04-20 MED FILL — ACETAMINOPHEN 500 MG TAB: 500 mg | ORAL | Qty: 2

## 2015-04-20 MED FILL — IPRATROPIUM-ALBUTEROL 2.5 MG-0.5 MG/3 ML NEB SOLUTION: 2.5 mg-0.5 mg/3 ml | RESPIRATORY_TRACT | Qty: 3

## 2015-04-20 NOTE — ED Notes (Signed)
The patient was discharged home by DR. Pandya  in stable condition. The patient is alert and oriented, in no respiratory distress and discharge vital signs obtained. The patient's diagnosis, condition and treatment were explained to pt. The patient expressed understanding. 1 prescription given. No work/school note given. A discharge plan has been developed. A case manager was not involved in the process. Aftercare instructions were given to the pt.  Pt ambulatory out of the ED

## 2015-04-20 NOTE — ED Provider Notes (Addendum)
Patient is a 39 y.o. female presenting with cough. The history is provided by the patient.   Cough  This is a new problem. The current episode started 12 to 24 hours ago. The problem occurs every few minutes. The problem has not changed since onset.The cough is productive of sputum. There has been a fever of 102 - 102.9 F. The fever has been present for less than 1 day. Associated symptoms include myalgias and wheezing. Pertinent negatives include no chest pain, no chills, no sweats, no eye redness, no ear congestion, no ear pain, no rhinorrhea, no sore throat, no shortness of breath, no nausea and no vomiting. Treatments tried: seen at urgent care today for same, given xopenex with improvement and rx's for medrol pack, z-pack, inhaler which she has not started yet. The treatment provided moderate relief. Risk factors: none. She is not a smoker. Past medical history comments: none.        History reviewed. No pertinent past medical history.    Past Surgical History:   Procedure Laterality Date   ??? Hx dilation and curettage           History reviewed. No pertinent family history.    History     Social History   ??? Marital Status: MARRIED     Spouse Name: N/A   ??? Number of Children: N/A   ??? Years of Education: N/A     Occupational History   ??? Not on file.     Social History Main Topics   ??? Smoking status: Former Smoker   ??? Smokeless tobacco: Never Used   ??? Alcohol Use: 9.0 oz/week     15 Glasses of wine per week   ??? Drug Use: No   ??? Sexual Activity: Not on file     Other Topics Concern   ??? Not on file     Social History Narrative   ??? No narrative on file           ALLERGIES: Review of patient's allergies indicates no known allergies.      Review of Systems   Constitutional: Positive for fever. Negative for chills, appetite change and unexpected weight change.   HENT: Negative.  Negative for ear pain, hearing loss, nosebleeds, rhinorrhea, sore throat and trouble swallowing.    Eyes: Negative for redness.    Respiratory: Positive for cough and wheezing. Negative for chest tightness and shortness of breath.    Cardiovascular: Negative.  Negative for chest pain and palpitations.   Gastrointestinal: Negative.  Negative for nausea, vomiting, abdominal pain, blood in stool and abdominal distention.   Endocrine: Negative.    Genitourinary: Positive for frequency. Negative for dysuria and hematuria.   Musculoskeletal: Positive for myalgias. Negative for back pain.   Skin: Negative.  Negative for rash.   Allergic/Immunologic: Negative.    Neurological: Negative.  Negative for dizziness, syncope, weakness and numbness.   Hematological: Negative.    Psychiatric/Behavioral: Negative.    All other systems reviewed and are negative.      Filed Vitals:    04/20/15 1716   BP: 145/80   Pulse: 118   Temp: 102.3 ??F (39.1 ??C)   Resp: 22   Height:  (1.676 m)   Weight: 64.864 kg (143 lb)   SpO2: 95%            Physical Exam   Constitutional: She is oriented to person, place, 39and time. She appears well-developed and well-nourished. No distress.   HENT:  Head: Normocephalic and atraumatic.   Right Ear: External ear normal.   Left Ear: External ear normal.   Nose: Nose normal.   Mouth/Throat: Oropharynx is clear and moist.   Eyes: Conjunctivae and EOM are normal. Pupils are equal, round, and reactive to light.   Neck: Normal range of motion. Neck supple. No JVD present. No thyromegaly present.   Cardiovascular: Regular rhythm, normal heart sounds and intact distal pulses.    No murmur heard.  Tachycardic     Pulmonary/Chest: Effort normal. No respiratory distress. She has wheezes. She has no rales.   Diffuse exp wheezing in all fields.  No distress.  No accessory muscle use.  No rales or rhonchi   Abdominal: Soft. Bowel sounds are normal. She exhibits no distension. There is no tenderness.   Musculoskeletal: Normal range of motion. She exhibits no edema.   Neurological: She is alert and oriented to person, place, and time. No  cranial nerve deficit.   Skin: Skin is warm and dry. No rash noted.   Psychiatric: She has a normal mood and affect. Her behavior is normal. Thought content normal.   Vitals reviewed.       MDM    Procedures    7:03 PM  Patient feels much better from a breathing perspective after nebs. Lungs clear to auscultation in all fields.  Chest xray reviewed and is without focal air space disease.  Will wait for temp to go down and tachycardia (likely from fever and nebs) to improve prior to discharge.  Agree with z-pack, steroids, inhalers.  Patient understands and agrees with plan.  Already has rx for inhaler and z-pack from previous md visit.

## 2015-04-20 NOTE — ED Notes (Signed)
Pt states she is breathing easier and feeling better since breathing treatment.  Pt tolerating water

## 2015-04-20 NOTE — ED Notes (Signed)
[facility] communication provided and informed of ???purposeful rounding??? to include collaboration of entire care team; patient acknowledged understanding. Pt receiving breathing treatments from CidraBeth, RT

## 2015-04-20 NOTE — ED Notes (Addendum)
Last night at midnight, started cough yellow sputum at times.  Fever started this afternoon.  Went to a clinic and was given breathing treatment (Levalbuterol) which helped.  Wheezing started this am. Increased urination today.  Pt was prescribed Azithromycin 250, medrol pack and proair MDI, but hasn't taken them yet

## 2015-05-30 ENCOUNTER — Encounter: Payer: Self-pay | Admitting: Obstetrics and Gynecology

## 2015-05-30 ENCOUNTER — Ambulatory Visit (INDEPENDENT_AMBULATORY_CARE_PROVIDER_SITE_OTHER): Payer: BLUE CROSS/BLUE SHIELD | Admitting: Obstetrics and Gynecology

## 2015-05-30 DIAGNOSIS — N920 Excessive and frequent menstruation with regular cycle: Secondary | ICD-10-CM | POA: Diagnosis not present

## 2015-05-30 DIAGNOSIS — N946 Dysmenorrhea, unspecified: Secondary | ICD-10-CM | POA: Diagnosis not present

## 2015-05-30 DIAGNOSIS — R82998 Other abnormal findings in urine: Secondary | ICD-10-CM

## 2015-05-30 DIAGNOSIS — Z8632 Personal history of gestational diabetes: Secondary | ICD-10-CM

## 2015-05-30 DIAGNOSIS — Z01419 Encounter for gynecological examination (general) (routine) without abnormal findings: Secondary | ICD-10-CM | POA: Diagnosis not present

## 2015-05-30 DIAGNOSIS — N390 Urinary tract infection, site not specified: Secondary | ICD-10-CM | POA: Diagnosis not present

## 2015-05-30 DIAGNOSIS — Z0000 Encounter for general adult medical examination without abnormal findings: Secondary | ICD-10-CM

## 2015-05-30 LAB — POCT URINALYSIS DIPSTICK
Bilirubin, UA: NEGATIVE
Blood, UA: NEGATIVE
GLUCOSE UA: NEGATIVE
Ketones, UA: NEGATIVE
NITRITE UA: NEGATIVE
Protein, UA: NEGATIVE
UROBILINOGEN UA: NEGATIVE
pH, UA: 5

## 2015-05-30 NOTE — Progress Notes (Signed)
Patient ID: Sarah Fields, female   DOB: 1976/08/21, 39 y.o.   MRN: [id number] 40 y.o. G30P2002 Married Caucasian female here for annual exam.    Separated from husband two months ago.  New job and can work from home.   Is an infant toddler specialist for childcare centers.  Not sexually active.  Declines STD testing.   Was considering Mirena IUD last year, and would like to pursue this now. Menses once a month.  Heavy and painful 2nd and 3rd day. Had ultrasound last year showing small intramural fibroid.  Normal benign EMB as well.  Right knee problems.  Loves to swim.  PCP:  Deatra James, MD   No LMP recorded.          Sexually active: Yes.  female  The current method of family planning is tubal ligation.    Exercising: No.  none. Smoker:  no  Health Maintenance: Pap:  05-18-14 wnl:neg HR HPV History of abnormal Pap:  Yes, Hx colposcopy 2000 but no treatment to cervix. MMG:  NEVER Colonoscopy:  n/a BMD:   n/a  Result  n/a TDaP:  Up to date with PCP Screening Labs:  Hb today: 12.0, Urine today: Trace WBC's--asymptomatic.  Last UTI - uncertain.  Maybe 2 years ago.   reports that she has never smoked. She does not have any smokeless tobacco history on file. She reports that she does not drink alcohol.  Past Medical History  Diagnosis Date  . Allergy   . Anxiety   . Asthma   . Depression   . Migraines     Past Surgical History  Procedure Laterality Date  . Cesarean section    . Knee surgery    . Tubal ligation      Current Outpatient Prescriptions  Medication Sig Dispense Refill  . albuterol (PROVENTIL HFA;VENTOLIN HFA) 108 (90 BASE) MCG/ACT inhaler Inhale 2 puffs into the lungs every 6 (six) hours as needed.    . montelukast (SINGULAIR) 10 MG tablet Take 10 mg by mouth at bedtime.    . rizatriptan (MAXALT) 10 MG tablet Take 10 mg by mouth as needed. May repeat in 2 hours if needed    . ADVAIR DISKUS 100-50 MCG/DOSE AEPB Inhale 1 continuous puffing into the  lungs daily.  1  . fluticasone (FLONASE) 50 MCG/ACT nasal spray Place 1 spray into both nostrils as needed.  5   No current facility-administered medications for this visit.    Family History  Problem Relation Age of Onset  . Hyperlipidemia Mother   . Migraines Mother   . Thyroid disease Mother   . Glaucoma Mother   . Osteoporosis Mother   . Seizures Father   . Hypertension Father   . Cancer Father 40    Prostate Ca  . Glaucoma Father   . Stroke Maternal Grandmother   . Cancer Maternal Grandfather   . Migraines Maternal Grandfather   . Stroke Paternal Grandfather     ROS:  Pertinent items are noted in HPI.  Otherwise, a comprehensive ROS was negative.  Exam:   There were no vitals taken for this visit.    General appearance: alert, cooperative and appears stated age Head: Normocephalic, without obvious abnormality, atraumatic Neck: no adenopathy, supple, symmetrical, trachea midline and thyroid normal to inspection and palpation Lungs: clear to auscultation bilaterally Breasts: normal appearance, no masses or tenderness, Inspection negative, No nipple retraction or dimpling, No nipple discharge or bleeding, No axillary or supraclavicular adenopathy Heart: regular rate and  rhythm Abdomen: Pfannenstiel incisions, soft, non-tender; bowel sounds normal; no masses,  no organomegaly Extremities: extremities normal, atraumatic, no cyanosis or edema Skin: Skin color, texture, turgor normal. No rashes or lesions Lymph nodes: Cervical, supraclavicular, and axillary nodes normal. No abnormal inguinal nodes palpated Neurologic: Grossly normal  Pelvic: External genitalia:  no lesions              Urethra:  normal appearing urethra with no masses, tenderness or lesions              Bartholins and Skenes: normal                 Vagina: normal appearing vagina with normal color and discharge, no lesions              Cervix: no lesions              Pap taken: No. Bimanual Exam:  Uterus:   normal size, contour, position, consistency, mobility, non-tender              Adnexa: normal adnexa and no mass, fullness, tenderness              Rectovaginal: Yes.  .  Confirms.              Anus:  normal sphincter tone, no lesions  Chaperone was present for exam.  Assessment:   Well woman visit with normal exam. Menorrhagia and dysmenorrhea.  Small intramural fibroid.  Status post BTL.  Status post C/S x 2.   Plan: Yearly mammogram recommended after age 39.  [facility]  Recommended self breast exam.  Pap and HR HPV as above. Discussed Calcium, Vitamin D, regular exercise program including cardiovascular and weight bearing exercise. Labs performed.  Yes.  .   See orders. Refills given on medications.  No..  See orders. Return for Mirena IUD insertion.  New precert to be done.  Follow up annually and prn.     After visit summary provided.

## 2015-05-30 NOTE — Patient Instructions (Signed)

## 2015-05-31 ENCOUNTER — Ambulatory Visit: Payer: BC Managed Care – PPO | Admitting: Obstetrics and Gynecology

## 2015-05-31 ENCOUNTER — Other Ambulatory Visit: Payer: Self-pay | Admitting: Obstetrics and Gynecology

## 2015-05-31 ENCOUNTER — Other Ambulatory Visit: Payer: Self-pay

## 2015-05-31 DIAGNOSIS — E781 Pure hyperglyceridemia: Secondary | ICD-10-CM

## 2015-05-31 DIAGNOSIS — Z1231 Encounter for screening mammogram for malignant neoplasm of breast: Secondary | ICD-10-CM

## 2015-05-31 DIAGNOSIS — D72829 Elevated white blood cell count, unspecified: Secondary | ICD-10-CM

## 2015-05-31 LAB — CBC
HCT: 38.9 % (ref 36.0–46.0)
HEMOGLOBIN: 12.2 g/dL (ref 12.0–15.0)
MCH: 27.1 pg (ref 26.0–34.0)
MCHC: 31.4 g/dL (ref 30.0–36.0)
MCV: 86.4 fL (ref 78.0–100.0)
MPV: 9 fL (ref 8.6–12.4)
PLATELETS: 335 10*3/uL (ref 150–400)
RBC: 4.5 MIL/uL (ref 3.87–5.11)
RDW: 14.9 % (ref 11.5–15.5)
WBC: 11.3 10*3/uL — AB (ref 4.0–10.5)

## 2015-05-31 LAB — LIPID PANEL
Cholesterol: 167 mg/dL (ref 0–200)
HDL: 37 mg/dL — ABNORMAL LOW (ref >=46)
LDL CALC: 71 mg/dL (ref 0–99)
TRIGLYCERIDES: 297 mg/dL — AB (ref <150)
Total CHOL/HDL Ratio: 4.5 Ratio
VLDL: 59 mg/dL — ABNORMAL HIGH (ref 0–40)

## 2015-05-31 LAB — COMPREHENSIVE METABOLIC PANEL
ALT: <8 U/L (ref 0–35)
AST: 13 U/L (ref 0–37)
Albumin: 3.7 g/dL (ref 3.5–5.2)
Alkaline Phosphatase: 87 U/L (ref 39–117)
BILIRUBIN TOTAL: 0.4 mg/dL (ref 0.2–1.2)
BUN: 12 mg/dL (ref 6–23)
CALCIUM: 8.9 mg/dL (ref 8.4–10.5)
CHLORIDE: 104 meq/L (ref 96–112)
CO2: 27 meq/L (ref 19–32)
CREATININE: 0.79 mg/dL (ref 0.50–1.10)
GLUCOSE: 71 mg/dL (ref 70–99)
Potassium: 4.8 mEq/L (ref 3.5–5.3)
Sodium: 139 mEq/L (ref 135–145)
Total Protein: 6.5 g/dL (ref 6.0–8.3)

## 2015-05-31 LAB — HEMOGLOBIN A1C
HEMOGLOBIN A1C: 5.5 % (ref <5.7)
Mean Plasma Glucose: 111 mg/dL (ref <117)

## 2015-05-31 LAB — TSH: TSH: 3.507 u[IU]/mL (ref 0.350–4.500)

## 2015-06-04 LAB — HEMOGLOBIN, FINGERSTICK: Hemoglobin, fingerstick: 12 g/dL (ref 12.0–16.0)

## 2015-06-06 ENCOUNTER — Ambulatory Visit
Admission: RE | Admit: 2015-06-06 | Discharge: 2015-06-06 | Disposition: A | Discharge status: Home / Self Care | Payer: BLUE CROSS/BLUE SHIELD | Source: Ambulatory Visit

## 2015-06-06 DIAGNOSIS — Z1231 Encounter for screening mammogram for malignant neoplasm of breast: Secondary | ICD-10-CM

## 2015-07-17 ENCOUNTER — Telehealth: Payer: Self-pay | Admitting: Obstetrics and Gynecology

## 2015-07-17 NOTE — Telephone Encounter (Signed)
Called patient to review benefits for procedure. Left voicemail to call back and review. °

## 2015-08-07 ENCOUNTER — Telehealth: Payer: Self-pay

## 2015-08-07 NOTE — Telephone Encounter (Signed)
Spoke with patient. Advised of message as seen below from [employer] CNM. Patient verbalizes understanding. Patient declines to schedule at this time. Would like to return call at a later time to schedule follow up lab appointment.  Routing to provider for final review. Patient agreeable to disposition. Will close encounter.

## 2015-08-07 NOTE — Telephone Encounter (Signed)
-----   Message from Patton Salles, MD sent at 08/06/2015 11:31 AM EDT ----- Regarding: please remind patient she is due to a lab recheck of her CBC Please let patient know she is due to recheck her CBC with differential.  Her WBC was elevated with her last blood draw.   Please schedule lab visit.    Thank you,   Conley Simmonds ----- Message -----    From: SYSTEM    Sent: 08/06/2015  12:04 AM      To: Patton Salles, MD

## 2015-08-31 ENCOUNTER — Telehealth: Payer: Self-pay | Admitting: Obstetrics and Gynecology

## 2015-08-31 NOTE — Telephone Encounter (Signed)
Called patient to check interest in IUD verified. Attempted to contact patient previously with no return call. Order still in workque. Left voicemail

## 2015-09-11 ENCOUNTER — Telehealth: Payer: Self-pay

## 2015-09-11 NOTE — Telephone Encounter (Signed)
-----   Message from Patton SallesBrook E Amundson C Silva, MD sent at 09/11/2015  9:10 AM EDT ----- Regarding: please contact patient to come in for a repeat CBC due to elevated WBC Please contact patient to return for her repeat CBC.   She came up in my recall list as needing this repeated due to her elevated WBC.  Thanks,   Brook  ----- Message -----    From: SYSTEM    Sent: 09/10/2015  12:04 AM      To: Patton SallesBrook E Amundson C Silva, MD

## 2015-09-11 NOTE — Telephone Encounter (Signed)
Left message to call Khya Halls at [phone]. 

## 2015-09-13 NOTE — Telephone Encounter (Signed)
Left message to call Jade Burright at [phone]. 

## 2015-09-18 NOTE — Telephone Encounter (Signed)
Thank you for the update.  I have closed the encounter.  

## 2015-09-18 NOTE — Telephone Encounter (Signed)
I have been unable to reach this patient x2. Letter sent to patient regarding scheduling a follow up lab appointment. Okay to close encounter?

## 2015-10-03 ENCOUNTER — Emergency Department (HOSPITAL_COMMUNITY): Payer: No Typology Code available for payment source

## 2015-10-03 ENCOUNTER — Encounter (HOSPITAL_COMMUNITY): Payer: Self-pay | Admitting: Emergency Medicine

## 2015-10-03 ENCOUNTER — Emergency Department (HOSPITAL_COMMUNITY)
Admission: EM | Admit: 2015-10-03 | Discharge: 2015-10-03 | Disposition: A | Discharge status: Home / Self Care | Payer: No Typology Code available for payment source | Attending: Emergency Medicine | Admitting: Emergency Medicine

## 2015-10-03 DIAGNOSIS — Z8659 Personal history of other mental and behavioral disorders: Secondary | ICD-10-CM | POA: Diagnosis not present

## 2015-10-03 DIAGNOSIS — Z91040 Latex allergy status: Secondary | ICD-10-CM | POA: Diagnosis not present

## 2015-10-03 DIAGNOSIS — J45909 Unspecified asthma, uncomplicated: Secondary | ICD-10-CM | POA: Insufficient documentation

## 2015-10-03 DIAGNOSIS — Y92410 Unspecified street and highway as the place of occurrence of the external cause: Secondary | ICD-10-CM | POA: Diagnosis not present

## 2015-10-03 DIAGNOSIS — S29001A Unspecified injury of muscle and tendon of front wall of thorax, initial encounter: Secondary | ICD-10-CM | POA: Diagnosis present

## 2015-10-03 DIAGNOSIS — S79922A Unspecified injury of left thigh, initial encounter: Secondary | ICD-10-CM | POA: Diagnosis not present

## 2015-10-03 DIAGNOSIS — Y998 Other external cause status: Secondary | ICD-10-CM | POA: Diagnosis not present

## 2015-10-03 DIAGNOSIS — Z79899 Other long term (current) drug therapy: Secondary | ICD-10-CM | POA: Diagnosis not present

## 2015-10-03 DIAGNOSIS — S299XXA Unspecified injury of thorax, initial encounter: Secondary | ICD-10-CM | POA: Diagnosis not present

## 2015-10-03 DIAGNOSIS — G43909 Migraine, unspecified, not intractable, without status migrainosus: Secondary | ICD-10-CM | POA: Insufficient documentation

## 2015-10-03 DIAGNOSIS — Z880 Allergy status to penicillin: Secondary | ICD-10-CM | POA: Insufficient documentation

## 2015-10-03 DIAGNOSIS — S8992XA Unspecified injury of left lower leg, initial encounter: Secondary | ICD-10-CM

## 2015-10-03 DIAGNOSIS — Y9389 Activity, other specified: Secondary | ICD-10-CM | POA: Insufficient documentation

## 2015-10-03 MED ORDER — HYDROCODONE-ACETAMINOPHEN 5-325 MG PO TABS
1.0000 | ORAL_TABLET | Freq: Once | ORAL | Status: AC
  Administered 2015-10-03: 1 via ORAL
  Filled 2015-10-03: qty 1

## 2015-10-03 MED ORDER — HYDROCODONE-ACETAMINOPHEN 5-325 MG PO TABS: 2.0000 | ORAL_TABLET | ORAL | Status: DC | PRN
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
## 2015-10-03 NOTE — ED Notes (Signed)
Pt stable, ambulatory, states understanding of discharge instructions 

## 2015-10-03 NOTE — ED Notes (Signed)
Patient transported to X-ray 

## 2016-03-24 NOTE — Progress Notes (Signed)
This is not my patient.  Multiple attempts made to determine what physician this result should be forwarded to on several occasions over the past 2+ months.  I believe administrative staff have checked w/ [employer]. Have requested administrative staff contact pt directly to see who she wants it forwarded to .

## 2016-06-12 NOTE — Progress Notes (Signed)
This path result was originally sent to me linked to 2 separate pts.  Paige PotashJennifer Lynch (DOB November 10, 1976, MRN [id number]).  Paige Lynch is not a pt of mine.  Per the accession number [id number]) it was also linked to Paige Lynch (DOB [birth year], MRN [id number]), Paige Lynch is a pt of mine and was notified of the results in March.    D/w Paige Lynch [facility])  this result needs to be taken out of this pt's chart to avoid confusion.

## 2016-06-12 NOTE — Progress Notes (Signed)
D/w Whit Alexander [employer]( [facility]) This report is misidentified.  The accession number links this report and bx to [facility] Cross.  Beverely LowJeannie is a pt of record w/ me.  Beverely LowJeannie was notified of this bx result 02/11/16.  Whit Lyn Hollingsheadlexander is going to pursue this further.  The path report [facility] be [facility].

## 2016-06-13 ENCOUNTER — Encounter: Payer: Self-pay | Admitting: Obstetrics and Gynecology

## 2016-06-13 ENCOUNTER — Ambulatory Visit (INDEPENDENT_AMBULATORY_CARE_PROVIDER_SITE_OTHER): Payer: BLUE CROSS/BLUE SHIELD | Admitting: Obstetrics and Gynecology

## 2016-06-13 VITALS — BP 120/78 | HR 80 | Resp 14 | Ht 64.5 in | Wt 265.0 lb

## 2016-06-13 DIAGNOSIS — N920 Excessive and frequent menstruation with regular cycle: Secondary | ICD-10-CM

## 2016-06-13 DIAGNOSIS — Z0000 Encounter for general adult medical examination without abnormal findings: Secondary | ICD-10-CM | POA: Diagnosis not present

## 2016-06-13 DIAGNOSIS — Z01419 Encounter for gynecological examination (general) (routine) without abnormal findings: Secondary | ICD-10-CM

## 2016-06-13 LAB — POCT URINALYSIS DIPSTICK
Bilirubin, UA: NEGATIVE
Blood, UA: NEGATIVE
Glucose, UA: NEGATIVE
Ketones, UA: NEGATIVE
LEUKOCYTES UA: NEGATIVE
NITRITE UA: NEGATIVE
PROTEIN UA: NEGATIVE
UROBILINOGEN UA: NEGATIVE
pH, UA: 5

## 2016-06-13 MED ORDER — NORETHINDRONE 0.35 MG PO TABS: 1.0000 | ORAL_TABLET | Freq: Every day | ORAL | Status: DC

## 2016-06-13 NOTE — Progress Notes (Signed)
40 y.o. G1P2002 Married Caucasian female here for annual exam.    Menses every other month are horrible with clots every other month.  This was the pattern every month in the past.  Had sonohysterogram and no intracavitary lesions.  Had small fibroid.  EMB benign proliferative endometrium. Never got a Mirena although we talked about this.  Hgb 12.2 with PCP.  Had labs with PCP.  Has elevated WBC 14.1 and elevated neutrophils.  Will have a recheck of this in August.  Started prednisone just prior to that blood work. Has a left knee injury that occurred with just walking.  Using a cane now.   Going to [employer].    PCP:  Deatra James, MD   Patient's last menstrual period was 06/06/2016 (exact date).     Period Cycle (Days): 30 Period Duration (Days): 7 Period Pattern: Regular Menstrual Flow:  (first 3 days heavy then lighter) Menstrual Control: Maxi pad Menstrual Control Change Freq (Hours): every 2 hrs on heaviest day Dysmenorrhea:  (occ. cramps)     Sexually active: No. female The current method of family planning is tubal ligation.    Exercising: Yes.    some walking and stationary bik. Smoker:  no  Health Maintenance: Pap:  05-18-14 Neg:Neg HR HPV History of abnormal Pap:  Yes, 2000 hx of colposcopy but no treatment to cervix. MMG:  06-07-15 Density A/Neg/birads1:[facility] Colonoscopy:  n/a BMD:   n/a  Result  n/a TDaP:  PCP Gardasil:   N/A   Screening Labs:  Hb today: PCP/also with employer, Urine today: Neg   reports that she has never smoked. She does not have any smokeless tobacco history on file. She reports that she does not drink alcohol or use illicit drugs.  Past Medical History  Diagnosis Date  . Allergy   . Anxiety   . Asthma   . Depression   . Migraines     Past Surgical History  Procedure Laterality Date  . Cesarean section    . Knee surgery Bilateral     arthroscopic  . Tubal ligation      Current Outpatient Prescriptions   Medication Sig Dispense Refill  . ADVAIR DISKUS 100-50 MCG/DOSE AEPB Inhale 1 continuous puffing into the lungs as needed.   1  . albuterol (PROVENTIL HFA;VENTOLIN HFA) 108 (90 BASE) MCG/ACT inhaler Inhale 2 puffs into the lungs every 6 (six) hours as needed.    . fluticasone (FLONASE) 50 MCG/ACT nasal spray Place 1 spray into both nostrils as needed.  5  . montelukast (SINGULAIR) 10 MG tablet Take 10 mg by mouth as needed.     . Nutritional Supplements (JUICE PLUS FIBRE PO) Take 1 tablet by mouth daily.    . rizatriptan (MAXALT) 10 MG tablet Take 10 mg by mouth as needed. May repeat in 2 hours if needed     No current facility-administered medications for this visit.    Family History  Problem Relation Age of Onset  . Hyperlipidemia Mother   . Migraines Mother   . Thyroid disease Mother   . Glaucoma Mother   . Osteoporosis Mother   . Seizures Father   . Hypertension Father   . Cancer Father 78    Prostate Ca  . Glaucoma Father   . Stroke Maternal Grandmother   . Cancer Maternal Grandfather   . Migraines Maternal Grandfather   . Stroke Paternal Grandfather     ROS:  Pertinent items are noted in HPI.  Otherwise, a  comprehensive ROS was negative.  Exam:   BP 120/78 mmHg  Pulse 80  Resp 14  Ht 5' 4.5" (1.638 m)  Wt 265 lb (120.203 kg)  BMI 44.80 kg/m2  LMP 06/06/2016 (Exact Date)    General appearance: alert, cooperative and appears stated age Head: Normocephalic, without obvious abnormality, atraumatic Neck: no adenopathy, supple, symmetrical, trachea midline and thyroid normal to inspection and palpation Lungs: clear to auscultation bilaterally Breasts: normal appearance, no masses or tenderness, Inspection negative, No nipple retraction or dimpling, No nipple discharge or bleeding, No axillary or supraclavicular adenopathy Heart: regular rate and rhythm Abdomen: incisions:  Yes.     Pfannensteil , soft, non-tender; no masses, no organomegaly Extremities: extremities  normal, atraumatic, no cyanosis or edema Skin: Skin color, texture, turgor normal. No rashes or lesions Lymph nodes: Cervical, supraclavicular, and axillary nodes normal. No abnormal inguinal nodes palpated Neurologic: Grossly normal  Pelvic: External genitalia:  no lesions              Urethra:  normal appearing urethra with no masses, tenderness or lesions              Bartholins and Skenes: normal                 Vagina: normal appearing vagina with normal color and discharge, no lesions              Cervix: no lesions              Pap taken: No. Bimanual Exam:  Uterus:  normal size, contour, position, consistency, mobility, non-tender              Adnexa: normal adnexa and no mass, fullness, tenderness              Rectal exam: Yes.  .  Confirms.              Anus:  normal sphincter tone, no lesions  Chaperone was present for exam.  Assessment:   Well woman visit with normal exam. Menorrhagia. Status post Cesarean section.  Status post BTL.  Migraines with aura.  Obesity.   Plan: Yearly mammogram recommended after age 42.  Recommended self breast exam.  Pap and HR HPV as above. Guidelines. for Calcium, Vitamin D, regular exercise program including cardiovascular and weight bearing exercise. Labs performed.  No..   See orders. Prescription medication(s) given.  Yes.  .  See orders.  Micronor 3 packs with 3 refills.  Instructed in use and side effects.  Call if bleeding is not improved.  Follow up annually and prn.       After visit summary provided.

## 2016-06-13 NOTE — Patient Instructions (Signed)
Health Maintenance, Female Adopting a healthy lifestyle and getting preventive care can go a long way to promote health and wellness. Talk with your health care provider about what schedule of regular examinations is right for you. This is a good chance for you to check in with your provider about disease prevention and staying healthy. In between checkups, there are plenty of things you can do on your own. Experts have done a lot of research about which lifestyle changes and preventive measures are most likely to keep you healthy. Ask your health care provider for more information. WEIGHT AND DIET  Eat a healthy diet  Be sure to include plenty of vegetables, fruits, low-fat dairy products, and lean protein.  Do not eat a lot of foods high in solid fats, added sugars, or salt.  Get regular exercise. This is one of the most important things you can do for your health.  Most adults should exercise for at least 150 minutes each week. The exercise should increase your heart rate and make you sweat (moderate-intensity exercise).  Most adults should also do strengthening exercises at least twice a week. This is in addition to the moderate-intensity exercise.  Maintain a healthy weight  Body mass index (BMI) is a measurement that can be used to identify possible weight problems. It estimates body fat based on height and weight. Your health care provider can help determine your BMI and help you achieve or maintain a healthy weight.  For females 20 years of age and older:   A BMI below 18.5 is considered underweight.  A BMI of 18.5 to 24.9 is normal.  A BMI of 25 to 29.9 is considered overweight.  A BMI of 30 and above is considered obese.  Watch levels of cholesterol and blood lipids  You should start having your blood tested for lipids and cholesterol at 42 years of age, then have this test every 5 years.  You may need to have your cholesterol levels checked more often if:  Your lipid  or cholesterol levels are high.  You are older than 42 years of age.  You are at high risk for heart disease.  CANCER SCREENING   Lung Cancer  Lung cancer screening is recommended for adults 55-80 years old who are at high risk for lung cancer because of a history of smoking.  A yearly low-dose CT scan of the lungs is recommended for people who:  Currently smoke.  Have quit within the past 15 years.  Have at least a 30-pack-year history of smoking. A pack year is smoking an average of one pack of cigarettes a day for 1 year.  Yearly screening should continue until it has been 15 years since you quit.  Yearly screening should stop if you develop a health problem that would prevent you from having lung cancer treatment.  Breast Cancer  Practice breast self-awareness. This means understanding how your breasts normally appear and feel.  It also means doing regular breast self-exams. Let your health care provider know about any changes, no matter how small.  If you are in your 20s or 30s, you should have a clinical breast exam (CBE) by a health care provider every 1-3 years as part of a regular health exam.  If you are 40 or older, have a CBE every year. Also consider having a breast X-ray (mammogram) every year.  If you have a family history of breast cancer, talk to your health care provider about genetic screening.  If you   are at high risk for breast cancer, talk to your health care provider about having an MRI and a mammogram every year.  Breast cancer gene (BRCA) assessment is recommended for women who have family members with BRCA-related cancers. BRCA-related cancers include:  Breast.  Ovarian.  Tubal.  Peritoneal cancers.  Results of the assessment will determine the need for genetic counseling and BRCA1 and BRCA2 testing. Cervical Cancer Your health care provider may recommend that you be screened regularly for cancer of the pelvic organs (ovaries, uterus, and  vagina). This screening involves a pelvic examination, including checking for microscopic changes to the surface of your cervix (Pap test). You may be encouraged to have this screening done every 3 years, beginning at age 21.  For women ages 30-65, health care providers may recommend pelvic exams and Pap testing every 3 years, or they may recommend the Pap and pelvic exam, combined with testing for human papilloma virus (HPV), every 5 years. Some types of HPV increase your risk of cervical cancer. Testing for HPV may also be done on women of any age with unclear Pap test results.  Other health care providers may not recommend any screening for nonpregnant women who are considered low risk for pelvic cancer and who do not have symptoms. Ask your health care provider if a screening pelvic exam is right for you.  If you have had past treatment for cervical cancer or a condition that could lead to cancer, you need Pap tests and screening for cancer for at least 20 years after your treatment. If Pap tests have been discontinued, your risk factors (such as having a new sexual partner) need to be reassessed to determine if screening should resume. Some women have medical problems that increase the chance of getting cervical cancer. In these cases, your health care provider may recommend more frequent screening and Pap tests. Colorectal Cancer  This type of cancer can be detected and often prevented.  Routine colorectal cancer screening usually begins at 42 years of age and continues through 42 years of age.  Your health care provider may recommend screening at an earlier age if you have risk factors for colon cancer.  Your health care provider may also recommend using home test kits to check for hidden blood in the stool.  A small camera at the end of a tube can be used to examine your colon directly (sigmoidoscopy or colonoscopy). This is done to check for the earliest forms of colorectal  cancer.  Routine screening usually begins at age 50.  Direct examination of the colon should be repeated every 5-10 years through 42 years of age. However, you may need to be screened more often if early forms of precancerous polyps or small growths are found. Skin Cancer  Check your skin from head to toe regularly.  Tell your health care provider about any new moles or changes in moles, especially if there is a change in a mole's shape or color.  Also tell your health care provider if you have a mole that is larger than the size of a pencil eraser.  Always use sunscreen. Apply sunscreen liberally and repeatedly throughout the day.  Protect yourself by wearing long sleeves, pants, a wide-brimmed hat, and sunglasses whenever you are outside. HEART DISEASE, DIABETES, AND HIGH BLOOD PRESSURE   High blood pressure causes heart disease and increases the risk of stroke. High blood pressure is more likely to develop in:  People who have blood pressure in the high end   of the normal range (130-139/85-89 mm Hg).  People who are overweight or obese.  People who are African American.  If you are 38-23 years of age, have your blood pressure checked every 3-5 years. If you are 61 years of age or older, have your blood pressure checked every year. You should have your blood pressure measured twice--once when you are at a hospital or clinic, and once when you are not at a hospital or clinic. Record the average of the two measurements. To check your blood pressure when you are not at a hospital or clinic, you can use:  An automated blood pressure machine at a pharmacy.  A home blood pressure monitor.  If you are between 45 years and 39 years old, ask your health care provider if you should take aspirin to prevent strokes.  Have regular diabetes screenings. This involves taking a blood sample to check your fasting blood sugar level.  If you are at a normal weight and have a low risk for diabetes,  have this test once every three years after 42 years of age.  If you are overweight and have a high risk for diabetes, consider being tested at a younger age or more often. PREVENTING INFECTION  Hepatitis B  If you have a higher risk for hepatitis B, you should be screened for this virus. You are considered at high risk for hepatitis B if:  You were born in a country where hepatitis B is common. Ask your health care provider which countries are considered high risk.  Your parents were born in a high-risk country, and you have not been immunized against hepatitis B (hepatitis B vaccine).  You have HIV or AIDS.  You use needles to inject street drugs.  You live with someone who has hepatitis B.  You have had sex with someone who has hepatitis B.  You get hemodialysis treatment.  You take certain medicines for conditions, including cancer, organ transplantation, and autoimmune conditions. Hepatitis C  Blood testing is recommended for:  Everyone born from [birth year] through 1967.  Anyone with known risk factors for hepatitis C. Sexually transmitted infections (STIs)  You should be screened for sexually transmitted infections (STIs) including gonorrhea and chlamydia if:  You are sexually active and are younger than 42 years of age.  You are older than 42 years of age and your health care provider tells you that you are at risk for this type of infection.  Your sexual activity has changed since you were last screened and you are at an increased risk for chlamydia or gonorrhea. Ask your health care provider if you are at risk.  If you do not have HIV, but are at risk, it may be recommended that you take a prescription medicine daily to prevent HIV infection. This is called pre-exposure prophylaxis (PrEP). You are considered at risk if:  You are sexually active and do not regularly use condoms or know the HIV status of your partner(s).  You take drugs by injection.  You are sexually  active with a partner who has HIV. Talk with your health care provider about whether you are at high risk of being infected with HIV. If you choose to begin PrEP, you should first be tested for HIV. You should then be tested every 3 months for as long as you are taking PrEP.  PREGNANCY   If you are premenopausal and you may become pregnant, ask your health care provider about preconception counseling.  If you may  become pregnant, take 400 to 800 micrograms (mcg) of folic acid every day.  If you want to prevent pregnancy, talk to your health care provider about birth control (contraception). OSTEOPOROSIS AND MENOPAUSE   Osteoporosis is a disease in which the bones lose minerals and strength with aging. This can result in serious bone fractures. Your risk for osteoporosis can be identified using a bone density scan.  If you are 61 years of age or older, or if you are at risk for osteoporosis and fractures, ask your health care provider if you should be screened.  Ask your health care provider whether you should take a calcium or vitamin D supplement to lower your risk for osteoporosis.  Menopause may have certain physical symptoms and risks.  Hormone replacement therapy may reduce some of these symptoms and risks. Talk to your health care provider about whether hormone replacement therapy is right for you.  HOME CARE INSTRUCTIONS   Schedule regular health, dental, and eye exams.  Stay current with your immunizations.   Do not use any tobacco products including cigarettes, chewing tobacco, or electronic cigarettes.  If you are pregnant, do not drink alcohol.  If you are breastfeeding, limit how much and how often you drink alcohol.  Limit alcohol intake to no more than 1 drink per day for nonpregnant women. One drink equals 12 ounces of beer, 5 ounces of wine, or 1 ounces of hard liquor.  Do not use street drugs.  Do not share needles.  Ask your health care provider for help if  you need support or information about quitting drugs.  Tell your health care provider if you often feel depressed.  Tell your health care provider if you have ever been abused or do not feel safe at home.   This information is not intended to replace advice given to you by your health care provider. Make sure you discuss any questions you have with your health care provider.   Document Released: 05/26/2011 Document Revised: 12/01/2014 Document Reviewed: 10/12/2013 Elsevier Interactive Patient Education [employer].

## 2016-06-17 LAB — SURGICAL PATHOLOGY

## 2016-07-02 NOTE — Progress Notes (Signed)
Per Whit Alexander([facility]) this has been taken care of.
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
## 2017-05-18 ENCOUNTER — Other Ambulatory Visit: Payer: Self-pay | Admitting: Obstetrics and Gynecology

## 2017-05-18 NOTE — Telephone Encounter (Signed)
Medication refill request: CAMILA Last AEX:  06/13/16 BS Next AEX: 07/02/17  Last MMG (if hormonal medication request): 06/06/15 BIRADS 1 negative/density a Refill authorized: 06/13/16 #3packs w/3 refills; today please advise

## 2017-05-19 NOTE — Telephone Encounter (Signed)
Patient needs to update her mammogram for refills of the Micronor.

## 2017-05-20 NOTE — Telephone Encounter (Signed)
Tried calling patient, no answer, left message to call me back at [phone]. 

## 2017-05-25 NOTE — Telephone Encounter (Signed)
Tried calling both home and mobile numbers listed in patient's chart. No answer, left message to return my call.

## 2017-05-26 ENCOUNTER — Telehealth: Payer: Self-pay | Admitting: Obstetrics and Gynecology

## 2017-05-26 ENCOUNTER — Other Ambulatory Visit: Payer: Self-pay | Admitting: Obstetrics and Gynecology

## 2017-05-26 DIAGNOSIS — Z1231 Encounter for screening mammogram for malignant neoplasm of breast: Secondary | ICD-10-CM

## 2017-05-26 MED ORDER — NORETHINDRONE 0.35 MG PO TABS: 1.0000 | ORAL_TABLET | Freq: Every day | ORAL | 0 refills | Status: DC

## 2017-05-26 NOTE — Telephone Encounter (Signed)
Patient returned call

## 2017-05-26 NOTE — Telephone Encounter (Signed)
Spoke with patient and gave recommendation for MMG. Patient is going to call and schedule -eh

## 2017-05-26 NOTE — Telephone Encounter (Signed)
Patient calling to let nurse know that she is scheduled for mammogram on July 12.

## 2017-05-26 NOTE — Addendum Note (Signed)
Addended by: Leda MinHAMM, Shah Insley N on: 05/26/2017 04:01 PM   Modules accepted: Orders

## 2017-05-26 NOTE — Telephone Encounter (Signed)
Left detailed message, ok per current dpr. Advised requested prescription sent to [facility] for 30 day supply, keep MMG appointment for future refills. Return call to [phone] for any additional questions.  Routing to provider for final review. Patient is agreeable to disposition. Will close encounter.

## 2017-05-26 NOTE — Telephone Encounter (Signed)
Ok for one month of Micronor.

## 2017-05-26 NOTE — Telephone Encounter (Signed)
Spoke with patient. Advised once MMG results received and reviewed, will return call with refill recommendations for Camila. Patient states she has 4 days left of pills, first available MMG was 7/12. Advised patient of previous recommendations, per Dr. Edward JollySilva, will need updated MMG for refills of Micronor ( see Refill request submitted 05/18/17). Advised patient Dr. Edward JollySilva is out of the office, will return on 7/5, will return call with any additional recommendations once reviewed. Patient verbalizes understanding.    Routing to provider for final review. Patient is agreeable to disposition. Will close encounter.

## 2017-06-04 ENCOUNTER — Ambulatory Visit
Admission: RE | Admit: 2017-06-04 | Discharge: 2017-06-04 | Disposition: A | Discharge status: Home / Self Care | Payer: BLUE CROSS/BLUE SHIELD | Source: Ambulatory Visit | Attending: Obstetrics and Gynecology | Admitting: Obstetrics and Gynecology

## 2017-06-04 DIAGNOSIS — Z1231 Encounter for screening mammogram for malignant neoplasm of breast: Secondary | ICD-10-CM

## 2017-06-26 ENCOUNTER — Other Ambulatory Visit: Payer: Self-pay | Admitting: Obstetrics and Gynecology

## 2017-06-26 NOTE — Telephone Encounter (Signed)
Medication refill request: OCP  Last AEX:  06-13-16 Next AEX: 07-02-17 Last MMG (if hormonal medication request): 06-05-17 WNL Refill authorized: please advise

## 2017-07-02 ENCOUNTER — Encounter: Payer: Self-pay | Admitting: Obstetrics and Gynecology

## 2017-07-02 ENCOUNTER — Ambulatory Visit: Payer: BLUE CROSS/BLUE SHIELD | Admitting: Obstetrics and Gynecology

## 2017-07-02 ENCOUNTER — Other Ambulatory Visit (HOSPITAL_COMMUNITY)
Admission: RE | Admit: 2017-07-02 | Discharge: 2017-07-02 | Disposition: A | Discharge status: Home / Self Care | Payer: BLUE CROSS/BLUE SHIELD | Source: Ambulatory Visit | Attending: Obstetrics and Gynecology | Admitting: Obstetrics and Gynecology

## 2017-07-02 VITALS — BP 118/76 | HR 84 | Resp 16 | Ht 64.5 in | Wt 272.0 lb

## 2017-07-02 DIAGNOSIS — Z01419 Encounter for gynecological examination (general) (routine) without abnormal findings: Secondary | ICD-10-CM | POA: Insufficient documentation

## 2017-07-02 DIAGNOSIS — R251 Tremor, unspecified: Secondary | ICD-10-CM

## 2017-07-02 MED ORDER — NORETHINDRONE 0.35 MG PO TABS: 1.0000 | ORAL_TABLET | Freq: Every day | ORAL | 3 refills | Status: DC

## 2017-07-02 NOTE — Progress Notes (Signed)
41 y.o. [id number] Married Caucasian female here for annual exam.    No menstrual problems.   Saw a nutritionist through work.   Also reporting fatigue and shaking. Feels like the shaking is increasing and she is asking for help. Saw her PCP and had lab work on 06/05/17.  Has normal CBC, TSH, blood chemistries, HgbA1C. Vit D 24.8.  Declines STD testing.   Interested to see new PCP.   PCP: Dr. Wynelle LinkSun Deboraha Sprang- [facility]   Patient's last menstrual period was 06/10/2017.           Sexually active: No.  The current method of family planning is tubal ligation/OCP.    Exercising: No.  The patient does not participate in regular exercise at present. Smoker:  no  Health Maintenance: Pap:  05-18-14 Neg:Neg HR HPV History of abnormal Pap:  Yes, 2000 hx of colposcopy but no treatment to cervix MMG:  06/04/17 BIRADS 1 negative/density b Colonoscopy:  n/a BMD:   n/a TDaP:  Up to date with PCP per patient Gardasil:   N/A Screening Labs: PCP takes care of labs   reports that she has never smoked. She has never used smokeless tobacco. She reports that she does not drink alcohol or use drugs.  Past Medical History:  Diagnosis Date  . Allergy   . Anxiety   . Asthma   . Depression   . Migraines     Past Surgical History:  Procedure Laterality Date  . CESAREAN SECTION    . KNEE SURGERY Bilateral    arthroscopic  . TUBAL LIGATION      Current Outpatient Prescriptions  Medication Sig Dispense Refill  . ADVAIR DISKUS 100-50 MCG/DOSE AEPB Inhale 1 continuous puffing into the lungs as needed.   1  . albuterol (PROVENTIL HFA;VENTOLIN HFA) 108 (90 BASE) MCG/ACT inhaler Inhale 2 puffs into the lungs every 6 (six) hours as needed.    . fluticasone (FLONASE) 50 MCG/ACT nasal spray Place 1 spray into both nostrils as needed.  5  . montelukast (SINGULAIR) 10 MG tablet Take 10 mg by mouth as needed.     . norethindrone (MICRONOR,CAMILA,ERRIN) 0.35 MG tablet TAKE 1 TABLET BY MOUTH EVERY DAY 28 tablet  0  . Nutritional Supplements (JUICE PLUS FIBRE PO) Take 1 tablet by mouth daily.    . rizatriptan (MAXALT) 10 MG tablet Take 10 mg by mouth as needed. May repeat in 2 hours if needed    . Vitamin D, Ergocalciferol, (DRISDOL) 50000 units CAPS capsule Take 1 capsule by mouth once a week.  0   No current facility-administered medications for this visit.     Family History  Problem Relation Age of Onset  . Hyperlipidemia Mother   . Migraines Mother   . Thyroid disease Mother   . Glaucoma Mother   . Osteoporosis Mother   . Seizures Father   . Hypertension Father   . Cancer Father 1774       Prostate Ca  . Glaucoma Father   . Stroke Maternal Grandmother   . Cancer Maternal Grandfather   . Migraines Maternal Grandfather   . Stroke Paternal Grandfather     ROS:  Pertinent items are noted in HPI.  Otherwise, a comprehensive ROS was negative.  Exam:   BP 118/76 (BP Location: Right Arm, Patient Position: Sitting, Cuff Size: Large)   Pulse 84   Resp 16   Ht 5' 4.5" (1.638 m)   Wt 272 lb (123.4 kg)   LMP 06/10/2017  BMI 45.97 kg/m     General appearance: alert, cooperative and appears stated age Head: Normocephalic, without obvious abnormality, atraumatic Neck: no adenopathy, supple, symmetrical, trachea midline and thyroid normal to inspection and palpation Lungs: clear to auscultation bilaterally Breasts: normal appearance, no masses or tenderness, No nipple retraction or dimpling, No nipple discharge or bleeding, No axillary or supraclavicular adenopathy Heart: regular rate and rhythm Abdomen: soft, non-tender; no masses, no organomegaly Extremities: extremities normal, atraumatic, no cyanosis or edema Skin: Skin color, texture, turgor normal. No rashes or lesions Lymph nodes: Cervical, supraclavicular, and axillary nodes normal. No abnormal inguinal nodes palpated Neurologic: Grossly normal  Pelvic: External genitalia:  no lesions              Urethra:  normal appearing  urethra with no masses, tenderness or lesions              Bartholins and Skenes: normal                 Vagina: normal appearing vagina with normal color and discharge, no lesions              Cervix: no lesions              Pap taken: Yes.   Bimanual Exam:  Uterus:  normal size, contour, position, consistency, mobility, non-tender              Adnexa: no mass, fullness, tenderness              Rectal exam: Yes.  .  Confirms.              Anus:  normal sphincter tone, no lesions.  Ext hemorrhoid noted.  Chaperone was present for exam.  Assessment:   Well woman visit with normal exam. Tremor.  Plan: Mammogram screening discussed. Recommended self breast awareness. Pap and HR HPV as above. Guidelines for Calcium, Vitamin D, regular exercise program including cardiovascular and weight bearing exercise. Referral to neurology.  Gave suggestion for new PCP.  Refill Micronor x 12 mo. Follow up annually and prn.   After visit summary provided.

## 2017-07-02 NOTE — Patient Instructions (Signed)

## 2017-07-06 LAB — CYTOLOGY - PAP
DIAGNOSIS: NEGATIVE
HPV (WINDOPATH): NOT DETECTED

## 2017-08-26 ENCOUNTER — Ambulatory Visit (INDEPENDENT_AMBULATORY_CARE_PROVIDER_SITE_OTHER): Payer: BLUE CROSS/BLUE SHIELD | Admitting: Neurology

## 2017-08-26 ENCOUNTER — Encounter: Payer: Self-pay | Admitting: Neurology

## 2017-08-26 VITALS — BP 123/89 | HR 74 | Wt 271.6 lb

## 2017-08-26 DIAGNOSIS — G250 Essential tremor: Secondary | ICD-10-CM

## 2017-08-26 DIAGNOSIS — R251 Tremor, unspecified: Secondary | ICD-10-CM

## 2017-08-26 MED ORDER — PROPRANOLOL HCL 10 MG PO TABS: 10.0000 mg | ORAL_TABLET | Freq: Three times a day (TID) | ORAL | 12 refills | Status: DC | PRN

## 2017-08-26 NOTE — Progress Notes (Signed)
[facility]    Provider:  Dr Lucia Gaskins Referring Provider: Deatra James, MD Primary Care Physician:  Deatra James, MD  CC:  Tremor  HPI:  Sarah Fields is a 41 y.o. female here as a referral from Dr. Wynelle Link for tremor. Past medical history anxiety, asthma, depression, migraines. Started within the last year. She feels like she is shaking more in th right arm. Her son has noticed it. It is the head, the arms mostly in the right arm. It is an action and postural tremor. She feels "horrible, jittery" like she is going. There can be anxiety involved but other times not, not always associated. No regular caffeine. Tremor is daily, worse 11am in the morning. She does not eat regularly, she is trying to do better. Father with epilepsy, his right hand shakes unknown why. No changes in medication or new medications at the time. She has a hard time holding onto things, holding a water bottle in her right hand is difficult more due to tremor as opposed to weakness. No numbness paresthesias. No other focal neurologic deficits, associated symptoms, inciting events or modifiable factors.   Reviewed notes, labs and imaging from outside physicians, which showed: Reviewed notes, primary care, patient reported "fatigue and shaking" and was sent to neurology for referral. Patient feels like she is shaking that is increasing and she is asking for help. Had normal CBC, TSH, blood chemistries, hemoglobin A1c, however vitamin D was low. Patient does not participate in regular exercise, she is not a smoker, she's had tubal ligation.  CMP in clues BUN 13 creatinine 0.89, normal, 06/05/2017.  hgba1c 5.5 05/2015  Review of Systems: Patient complains of symptoms per HPI as well as the following symptoms: weakness, tremor. Pertinent negatives and positives per HPI. All others negative.   Social History   Social History  . Marital status: Married    Spouse name: N/A  . Number of children: N/A  .  Years of education: N/A   Occupational History  . Not on file.   Social History Main Topics  . Smoking status: Never Smoker  . Smokeless tobacco: Never Used  . Alcohol use No  . Drug use: No  . Sexual activity: No     Comment: Tubal   Other Topics Concern  . Not on file   Social History Narrative  . No narrative on file    Family History  Problem Relation Age of Onset  . Hyperlipidemia Mother   . Migraines Mother   . Thyroid disease Mother   . Glaucoma Mother   . Osteoporosis Mother   . Seizures Father   . Hypertension Father   . Cancer Father 57       Prostate Ca  . Glaucoma Father   . Stroke Maternal Grandmother   . Cancer Maternal Grandfather   . Migraines Maternal Grandfather   . Stroke Paternal Grandfather     Past Medical History:  Diagnosis Date  . Allergy   . Anxiety   . Asthma   . Depression   . Migraines     Past Surgical History:  Procedure Laterality Date  . CESAREAN SECTION    . KNEE SURGERY Bilateral    arthroscopic  . TUBAL LIGATION      Current Outpatient Prescriptions  Medication Sig Dispense Refill  . ADVAIR DISKUS 100-50 MCG/DOSE AEPB Inhale 1 continuous puffing into the lungs as needed.   1  . albuterol (PROVENTIL HFA;VENTOLIN HFA) 108 (90 BASE) MCG/ACT inhaler Inhale 2  puffs into the lungs every 6 (six) hours as needed.    . fluticasone (FLONASE) 50 MCG/ACT nasal spray Place 1 spray into both nostrils as needed.  5  . montelukast (SINGULAIR) 10 MG tablet Take 10 mg by mouth as needed.     . norethindrone (MICRONOR,CAMILA,ERRIN) 0.35 MG tablet Take 1 tablet (0.35 mg total) by mouth daily. 3 Package 3  . rizatriptan (MAXALT) 10 MG tablet Take 10 mg by mouth as needed. May repeat in 2 hours if needed    . UNABLE TO FIND Med Name: Juice Plus Garden Blend 2 capsules daily    . UNABLE TO FIND Med Name:  Juice Plus Orchard Blend 2 capsules daily    . UNABLE TO FIND Med Name:  Juice Plus Vineyard Blend 2 capsules daily    . UNABLE TO FIND  Med Name:  Juice Plus Omega Blend 2 capsules daily    . propranolol (INDERAL) 10 MG tablet Take 1 tablet (10 mg total) by mouth 3 (three) times daily as needed. 90 tablet 12   No current facility-administered medications for this visit.     Allergies as of 08/26/2017 - Review Complete 08/26/2017  Allergen Reaction Noted  . Latex  02/04/2014  . Other  02/04/2014  . Penicillins Swelling 02/04/2014  . Adhesive [tape] Rash 10/03/2015  . Bactrim [sulfamethoxazole-trimethoprim] Rash 06/13/2016    Vitals: BP 123/89   Pulse 74   Wt 271 lb 9.6 oz (123.2 kg)   BMI 45.90 kg/m  Last Weight:  Wt Readings from Last 1 Encounters:  08/26/17 271 lb 9.6 oz (123.2 kg)   Last Height:   Ht Readings from Last 1 Encounters:  07/02/17 5' 4.5" (1.638 m)    Physical exam: Exam: Gen: NAD, conversant, well nourised, obese, well groomed                     CV: RRR, no MRG. No Carotid Bruits. No peripheral edema, warm, nontender Eyes: Conjunctivae clear without exudates or hemorrhage  Neuro: Detailed Neurologic Exam  Speech:    Speech is normal; fluent and spontaneous with normal comprehension.  Cognition:    The patient is oriented to person, place, and time;     recent and remote memory intact;     language fluent;     normal attention, concentration,     fund of knowledge Cranial Nerves:    The pupils are equal, round, and reactive to light. The fundi are normal and spontaneous venous pulsations are present. Visual fields are full to finger confrontation. Extraocular movements are intact. Trigeminal sensation is intact and the muscles of mastication are normal. The face is symmetric. The palate elevates in the midline. Hearing intact. Voice is normal. Shoulder shrug is normal. The tongue has normal motion without fasciculations.   Coordination:    Normal finger to nose and heel to shin. Normal rapid alternating movements.   Gait:    Heel-toe and tandem gait are normal.   Motor  Observation:    Fine postural  Tone:    Normal muscle tone.    Posture:    Posture is normal. normal erect    Strength:    Strength is V/V in the upper and lower limbs.      Sensation: intact to LT     Reflex Exam:  DTR's:    Deep tendon reflexes in the upper and lower extremities are normal bilaterally.   Toes:    The toes are downgoing bilaterally.  Clonus:    Clonus is absent.      Assessment/Plan:  This is a 41 year old female with high frequency low amplitude positional and action tremor likely essential tremor. Did discuss other causes of tremor including caffeine use, anxiety, physiologic tremor, hypoglycemia and others. Can try propranolol up to 3 times a day as needed. Discussed side effects of propranolol to stop for any thing significant especially lightheadedness, dizziness, weakness, increased asthma symptoms, hypotension, bradycardia.  Orders Placed This Encounter  Procedures  . Heavy metals, blood  . Copper, serum  . Ceruloplasmin     Naomie Dean, MD  [facility] [address] [place], [place] [address]  Phone [phone] Fax [phone]

## 2017-08-26 NOTE — Patient Instructions (Addendum)
Essential Tremor A tremor is trembling or shaking that you cannot control. Most tremors affect the hands or arms. Tremors can also affect the head, vocal cords, face, and other parts of the body. Essential tremor is a tremor without a known cause. What are the causes? Essential tremor has no known cause. What increases the risk? You may be at greater risk of essential tremor if:  You have a family member with essential tremor.  You are age 43 or older.  You take certain medicines.  What are the signs or symptoms? The main sign of a tremor is uncontrolled and unintentional rhythmic shaking of a body part.  You may have difficulty eating with a spoon or fork.  You may have difficulty writing.  You may nod your head up and down or side to side.  You may have a quivering voice.  Your tremors:  May get worse over time.  May come and go.  May be more noticeable on one side of your body.  May get worse due to stress, fatigue, caffeine, and extreme heat or cold.  How is this diagnosed? Your health care provider can diagnose essential tremor based on your symptoms, medical history, and a physical examination. There is no single test to diagnose an essential tremor. However, your health care provider may perform a variety of tests to rule out other conditions. Tests may include:  Blood and urine tests.  Imaging studies of your brain, such as: ? CT scan. ? MRI.  A test that measures involuntary muscle movement (electromyogram).  How is this treated? Your tremors may go away without treatment. Mild tremors may not need treatment if they do not affect your day-to-day life. Severe tremors may need to be treated using one or a combination of the following options:  Medicines. This may include medicine that is injected.  Lifestyle changes.  Physical therapy.  Follow these instructions at home:  Take medicines only as directed by your health care provider.  Limit alcohol  intake to no more than 1 drink per day for nonpregnant women and 2 drinks per day for men. One drink equals 12 oz of beer, 5 oz of wine, or 1 oz of hard liquor.  Do not use any tobacco products, including cigarettes, chewing tobacco, or electronic cigarettes. If you need help quitting, ask your health care provider.  Take medicines only as directed by your health care provider.  Avoid extreme heat or cold.  Limit the amount of caffeine you consumeas directed by your health care provider.  Try to get eight hours of sleep each night.  Find ways to manage your stress, such as meditation or yoga.  Keep all follow-up visits as directed by your health care provider. This is important. This includes any physical therapy visits. Contact a health care provider if:  You experience any changes in the location or intensity of your tremors.  You start having a tremor after starting a new medicine.  You have tremor with other symptoms such as: ? Numbness. ? Tingling. ? Pain. ? Weakness.  Your tremor gets worse.  Your tremor interferes with your daily life. This information is not intended to replace advice given to you by your health care provider. Make sure you discuss any questions you have with your health care provider. Document Released: 12/01/2014 Document Revised: 04/17/2016 Document Reviewed: 05/08/2014 Elsevier Interactive Patient Education  2018 Elsevier Inc.  Propranolol tablets What is this medicine? PROPRANOLOL (proe PRAN oh lole) is a beta-blocker. Beta-blockers   reduce the workload on the heart and help it to beat more regularly. This medicine is used to treat high blood pressure, to control irregular heart rhythms (arrhythmias) and to relieve chest pain caused by angina. It may also be helpful after a heart attack. This medicine is also used to prevent migraine headaches, relieve uncontrollable shaking (tremors), and help certain problems related to the thyroid gland and  adrenal gland. This medicine may be used for other purposes; ask your health care provider or pharmacist if you have questions. COMMON BRAND NAME(S): Inderal What should I tell my health care provider before I take this medicine? They need to know if you have any of these conditions: -circulation problems or blood vessel disease -diabetes -history of heart attack or heart disease, vasospastic angina -kidney disease -liver disease -lung or breathing disease, like asthma or emphysema -pheochromocytoma -slow heart rate -thyroid disease -an unusual or allergic reaction to propranolol, other beta-blockers, medicines, foods, dyes, or preservatives -pregnant or trying to get pregnant -breast-feeding How should I use this medicine? Take this medicine by mouth with a glass of water. Follow the directions on the prescription label. Take your doses at regular intervals. Do not take your medicine more often than directed. Do not stop taking except on your the advice of your doctor or health care professional. Talk to your pediatrician regarding the use of this medicine in children. Special care may be needed. Overdosage: If you think you have taken too much of this medicine contact a poison control center or emergency room at once. NOTE: This medicine is only for you. Do not share this medicine with others. What if I miss a dose? If you miss a dose, take it as soon as you can. If it is almost time for your next dose, take only that dose. Do not take double or extra doses. What may interact with this medicine? Do not take this medicine with any of the following medications: -feverfew -phenothiazines like chlorpromazine, mesoridazine, prochlorperazine, thioridazine This medicine may also interact with the following medications: -aluminum hydroxide gel -antipyrine -antiviral medicines for HIV or AIDS -barbiturates like phenobarbital -certain medicines for blood pressure, heart disease, irregular  heart beat -cimetidine -ciprofloxacin -diazepam -fluconazole -haloperidol -isoniazid -medicines for cholesterol like cholestyramine or colestipol -medicines for mental depression -medicines for migraine headache like almotriptan, eletriptan, frovatriptan, naratriptan, rizatriptan, sumatriptan, zolmitriptan -NSAIDs, medicines for pain and inflammation, like ibuprofen or naproxen -phenytoin -rifampin -teniposide -theophylline -thyroid medicines -tolbutamide -warfarin -zileuton This list may not describe all possible interactions. Give your health care provider a list of all the medicines, herbs, non-prescription drugs, or dietary supplements you use. Also tell them if you smoke, drink alcohol, or use illegal drugs. Some items may interact with your medicine. What should I watch for while using this medicine? Visit your doctor or health care professional for regular check ups. Check your blood pressure and pulse rate regularly. Ask your health care professional what your blood pressure and pulse rate should be, and when you should contact them. You may get drowsy or dizzy. Do not drive, use machinery, or do anything that needs mental alertness until you know how this drug affects you. Do not stand or sit up quickly, especially if you are an older patient. This reduces the risk of dizzy or fainting spells. Alcohol can make you more drowsy and dizzy. Avoid alcoholic drinks. This medicine can affect blood sugar levels. If you have diabetes, check with your doctor or health care professional before you change your   diet or the dose of your diabetic medicine. Do not treat yourself for coughs, colds, or pain while you are taking this medicine without asking your doctor or health care professional for advice. Some ingredients may increase your blood pressure. What side effects may I notice from receiving this medicine? Side effects that you should report to your doctor or health care professional as  soon as possible: -allergic reactions like skin rash, itching or hives, swelling of the face, lips, or tongue -breathing problems -changes in blood sugar -cold hands or feet -difficulty sleeping, nightmares -dry peeling skin -hallucinations -muscle cramps or weakness -slow heart rate -swelling of the legs and ankles -vomiting Side effects that usually do not require medical attention (report to your doctor or health care professional if they continue or are bothersome): -change in sex drive or performance -diarrhea -dry sore eyes -hair loss -nausea -weak or tired This list may not describe all possible side effects. Call your doctor for medical advice about side effects. You may report side effects to FDA at 1-800-FDA-1088. Where should I keep my medicine? Keep out of the reach of children. Store at room temperature between 15 and 30 degrees C (59 and 86 degrees F). Protect from light. Throw away any unused medicine after the expiration date. NOTE: This sheet is a summary. It may not cover all possible information. If you have questions about this medicine, talk to your doctor, pharmacist, or health care provider.  2018 Elsevier/Gold Standard (2013-07-15 14:51:53)   

## 2017-08-28 LAB — CERULOPLASMIN: CERULOPLASMIN: 29.8 mg/dL (ref 19.0–39.0)

## 2017-08-28 LAB — COPPER, SERUM: COPPER: 132 ug/dL (ref 72–166)

## 2017-08-28 LAB — HEAVY METALS, BLOOD
ARSENIC: 7 ug/L (ref 2–23)
LEAD, BLOOD: NOT DETECTED ug/dL (ref 0–4)
Mercury: NOT DETECTED ug/L (ref 0.0–14.9)

## 2017-08-31 ENCOUNTER — Telehealth: Payer: Self-pay | Admitting: Neurology

## 2017-08-31 NOTE — Telephone Encounter (Signed)
Called and LVM for patient to return call so that I could inform her that her labs were normal. Pt labs are WNL please inform her of this if she calls back.

## 2017-08-31 NOTE — Telephone Encounter (Signed)
-----   Message from Anson Fret, MD sent at 08/28/2017 12:20 PM EDT ----- Labs normal thanks

## 2018-06-23 ENCOUNTER — Other Ambulatory Visit: Payer: Self-pay | Admitting: Obstetrics and Gynecology

## 2018-06-23 NOTE — Telephone Encounter (Signed)
Medication refill request: micronor Last AEX:  07-02-17 Next AEX: 07-14-18 Last MMG (if hormonal medication request): 06-04-17 category b density birads 1:neg Refill authorized: pharmacy requested 3mth supply for patient. Please approve if appropriate

## 2018-07-14 ENCOUNTER — Other Ambulatory Visit: Payer: Self-pay

## 2018-07-14 ENCOUNTER — Ambulatory Visit: Payer: Managed Care, Other (non HMO) | Admitting: Obstetrics and Gynecology

## 2018-07-14 ENCOUNTER — Encounter: Payer: Self-pay | Admitting: Obstetrics and Gynecology

## 2018-07-14 VITALS — BP 104/78 | HR 76 | Resp 16 | Ht 64.75 in | Wt 287.0 lb

## 2018-07-14 DIAGNOSIS — Z01419 Encounter for gynecological examination (general) (routine) without abnormal findings: Secondary | ICD-10-CM

## 2018-07-14 MED ORDER — NORETHINDRONE 0.35 MG PO TABS: 1.0000 | ORAL_TABLET | Freq: Every day | ORAL | 3 refills | Status: DC

## 2018-07-14 NOTE — Progress Notes (Signed)
42 y.o. [id number] Married Caucasian female here for annual exam.    On Micronor to treat menorrhagia. Menses occur every 1 - 2 months.  They control her bleeding well. Having hot flashes.   HAs are doing well overall.  Has them more with physical activity and exercise.  States this can turn into a migraine.  Has difficulty with weight gain.   On Propranolol for tremor.   Labs done with PCP.  Brings in copy today to be scanned in. TSH normal.  Normal glucose.   PCP: Dr. Wynelle LinkSun    Patient's last menstrual period was 06/12/2018.           Sexually active: No.  The current method of family planning is tubal ligation.    Exercising: No.  The patient does not participate in regular exercise at present. Smoker:  no  Health Maintenance: Pap:  07/02/17 Neg:Neg HR HPV History of abnormal Pap:  Yes, 2000 hx of colposcopy but no treatment to cervix MMG:  06/04/17 BIRADS 1 negative/density b TDaP:  UTD w/ PCP per patient Gardasil:   no HIV: possibly done with pregnancy Hep C: never Screening Labs:  PCP   reports that she has never smoked. She has never used smokeless tobacco. She reports that she does not drink alcohol or use drugs.  Past Medical History:  Diagnosis Date  . Allergy   . Anxiety   . Asthma   . Depression   . Migraines    with aura    Past Surgical History:  Procedure Laterality Date  . CESAREAN SECTION    . KNEE SURGERY Bilateral    arthroscopic  . TUBAL LIGATION      Current Outpatient Medications  Medication Sig Dispense Refill  . ADVAIR DISKUS 100-50 MCG/DOSE AEPB Inhale 1 continuous puffing into the lungs as needed.   1  . albuterol (PROVENTIL HFA;VENTOLIN HFA) 108 (90 BASE) MCG/ACT inhaler Inhale 2 puffs into the lungs every 6 (six) hours as needed.    . fluticasone (FLONASE) 50 MCG/ACT nasal spray Place 1 spray into both nostrils as needed.  5  . montelukast (SINGULAIR) 10 MG tablet Take 10 mg by mouth as needed.     . norethindrone  (MICRONOR,CAMILA,ERRIN) 0.35 MG tablet TAKE 1 TABLET BY MOUTH EVERY DAY 84 tablet 0  . propranolol (INDERAL) 10 MG tablet Take 1 tablet (10 mg total) by mouth 3 (three) times daily as needed. 90 tablet 12  . rizatriptan (MAXALT) 10 MG tablet Take 10 mg by mouth as needed. May repeat in 2 hours if needed    . UNABLE TO FIND Med Name: Juice Plus Garden Blend 2 capsules daily    . UNABLE TO FIND Med Name:  Juice Plus Orchard Blend 2 capsules daily    . UNABLE TO FIND Med Name:  Juice Plus Vineyard Blend 2 capsules daily    . UNABLE TO FIND Med Name:  Juice Plus Omega Blend 2 capsules daily    . Vitamin D, Ergocalciferol, (DRISDOL) 50000 units CAPS capsule TAKE ONE CAPSULE BY MOUTH ONE TIME PER WEEK  1   No current facility-administered medications for this visit.     Family History  Problem Relation Age of Onset  . Hyperlipidemia Mother   . Migraines Mother   . Thyroid disease Mother   . Glaucoma Mother   . Osteoporosis Mother   . Seizures Father   . Hypertension Father   . Cancer Father [id number]       Prostate Ca  .  Glaucoma Father   . Stroke Maternal Grandmother   . Cancer Maternal Grandfather   . Migraines Maternal Grandfather   . Stroke Paternal Grandfather     Review of Systems  Constitutional:       Hot flashes  All other systems reviewed and are negative.   Exam:   BP 104/78 (BP Location: Right Arm, Patient Position: Sitting, Cuff Size: Large)   Pulse 76   Resp 16   Ht 5' 4.75" (1.645 m)   Wt 287 lb (130.2 kg)   LMP 06/12/2018   BMI 48.13 kg/m     General appearance: alert, cooperative and appears stated age Head: Normocephalic, without obvious abnormality, atraumatic Neck: no adenopathy, supple, symmetrical, trachea midline and thyroid normal to inspection and palpation Lungs: clear to auscultation bilaterally Breasts: normal appearance, no masses or tenderness, No nipple retraction or dimpling, No nipple discharge or bleeding, No axillary or supraclavicular  adenopathy Heart: regular rate and rhythm Abdomen: soft, non-tender; no masses, no organomegaly Extremities: extremities normal, atraumatic, no cyanosis or edema Skin: Skin color, texture, turgor normal. No rashes or lesions Lymph nodes: Cervical, supraclavicular, and axillary nodes normal. No abnormal inguinal nodes palpated Neurologic: Grossly normal  Pelvic: External genitalia:  no lesions              Urethra:  normal appearing urethra with no masses, tenderness or lesions              Bartholins and Skenes: normal                 Vagina: normal appearing vagina with normal color and discharge, no lesions              Cervix: no lesions              Pap taken: No. Bimanual Exam:  Uterus:  normal size, contour, position, consistency, mobility, non-tender              Adnexa: no mass, fullness, tenderness              Rectal exam: Yes.  .  Confirms.              Anus:  normal sphincter tone, no lesions  Chaperone was present for exam.  Assessment:   Well woman visit with normal exam. Status post BTL.  On Micronor for menorrhagia. Hx cryotherapy.  Migraines with aura.  HA with exercise.  Obesity.   Plan: Mammogram screening.  She will schedule.  Recommended self breast awareness. Pap and HR HPV as above. Guidelines for Calcium, Vitamin D, regular exercise program including cardiovascular and weight bearing exercise. Herbal options for menopausal symptoms.  I also discussed SSRIs/SNRIs for this.  Will start with herbs first.  Refill of Camilla for one year. She will see her PCP regarding HA with exercise.  She will consider going to a seminar for surgical weight loss.  Follow up annually and prn.     After visit summary provided.

## 2018-07-14 NOTE — Patient Instructions (Addendum)
EXERCISE AND DIET:  We recommended that you start or continue a regular exercise program for good health. Regular exercise means any activity that makes your heart beat faster and makes you sweat.  We recommend exercising at least 30 minutes per day at least 3 days a week, preferably 4 or 5.  We also recommend a diet low in fat and sugar.  Inactivity, poor dietary choices and obesity can cause diabetes, heart attack, stroke, and kidney damage, among others.    ALCOHOL AND SMOKING:  Women should limit their alcohol intake to no more than 7 drinks/beers/glasses of wine (combined, not each!) per week. Moderation of alcohol intake to this level decreases your risk of breast cancer and liver damage. And of course, no recreational drugs are part of a healthy lifestyle.  And absolutely no smoking or even second hand smoke. Most people know smoking can cause heart and lung diseases, but did you know it also contributes to weakening of your bones? Aging of your skin?  Yellowing of your teeth and nails?  CALCIUM AND VITAMIN D:  Adequate intake of calcium and Vitamin D are recommended.  The recommendations for exact amounts of these supplements seem to change often, but generally speaking 600 mg of calcium (either carbonate or citrate) and 800 units of Vitamin D per day seems prudent. Certain women may benefit from higher intake of Vitamin D.  If you are among these women, your doctor will have told you during your visit.    PAP SMEARS:  Pap smears, to check for cervical cancer or precancers,  have traditionally been done yearly, although recent scientific advances have shown that most women can have pap smears less often.  However, every woman still should have a physical exam from her gynecologist every year. It will include a breast check, inspection of the vulva and vagina to check for abnormal growths or skin changes, a visual exam of the cervix, and then an exam to evaluate the size and shape of the uterus and  ovaries.  And after 44 years of age, a rectal exam is indicated to check for rectal cancers. We will also provide age appropriate advice regarding health maintenance, like when you should have certain vaccines, screening for sexually transmitted diseases, bone density testing, colonoscopy, mammograms, etc.   MAMMOGRAMS:  All women over 40 years old should have a yearly mammogram. Many facilities now offer a "3D" mammogram, which may cost around $50 extra out of pocket. If possible,  we recommend you accept the option to have the 3D mammogram performed.  It both reduces the number of women who will be called back for extra views which then turn out to be normal, and it is better than the routine mammogram at detecting truly abnormal areas.    COLONOSCOPY:  Colonoscopy to screen for colon cancer is recommended for all women at age 50.  We know, you hate the idea of the prep.  We agree, BUT, having colon cancer and not knowing it is worse!!  Colon cancer so often starts as a polyp that can be seen and removed at colonscopy, which can quite literally save your life!  And if your first colonoscopy is normal and you have no family history of colon cancer, most women don't have to have it again for 10 years.  Once every ten years, you can do something that may end up saving your life, right?  We will be happy to help you get it scheduled when you are ready.    Be sure to check your insurance coverage so you understand how much it will cost.  It may be covered as a preventative service at no cost, but you should check your particular policy.        Menopause and Herbal Products What is menopause? Menopause is the normal time of life when menstrual periods decrease in frequency and eventually stop completely. This process can take several years for some women. Menopause is complete when you have had an absence of menstruation for a full year since your last menstrual period. It usually occurs between the ages of 3248  and 4055. It is not common for menopause to begin before the age of 44. During menopause, your body stops producing the female hormones estrogen and progesterone. Common symptoms associated with this loss of hormones (vasomotor symptoms) are:  Hot flashes.  Hot flushes.  Night sweats.  Other common symptoms and complications of menopause include:  Decrease in sex drive.  Vaginal dryness and thinning of the walls of the vagina. This can make sex painful.  Dryness of the skin and development of wrinkles.  Headaches.  Tiredness.  Irritability.  Memory problems.  Weight gain.  Bladder infections.  Hair growth on the face and chest.  Inability to reproduce offspring (infertility).  Loss of density in the bones (osteoporosis) increasing your risk for breaks (fractures).  Depression.  Hardening and narrowing of the arteries (atherosclerosis). This increases your risk of heart attack and stroke.  What treatment options are available? There are many treatment choices for menopause symptoms. The most common treatment is hormone replacement therapy. Many alternative therapies for menopause are emerging, including the use of herbal products. These supplements can be found in the form of herbs, teas, oils, tinctures, and pills. Common herbal supplements for menopause are made from plants that contain phytoestrogens. Phytoestrogens are compounds that occur naturally in plants and plant products. They act like estrogen in the body. Foods and herbs that contain phytoestrogens include:  Soy.  Flax seeds.  Red clover.  Ginseng.  What menopause symptoms may be helped if I use herbal products?  Vasomotor symptoms. These may be helped by: ? Soy. Some studies show that soy may have a moderate benefit for hot flashes. ? Black cohosh. There is limited evidence indicating this may be beneficial for hot flashes.  Symptoms that are related to heart and blood vessel disease. These may be  helped by soy. Studies have shown that soy can help to lower cholesterol.  Depression. This may be helped by: ? St. John's wort. There is limited evidence that shows this may help mild to moderate depression. ? Black cohosh. There is evidence that this may help depression and mood swings.  Osteoporosis. Soy may help to decrease bone loss that is associated with menopause and may prevent osteoporosis. Limited evidence indicates that red clover may offer some bone loss protection as well. Other herbal products that are commonly used during menopause lack enough evidence to support their use as a replacement for conventional menopause therapies. These products include evening primrose, ginseng, and red clover. What are the cases when herbal products should not be used during menopause? Do not use herbal products during menopause without your health care provider's approval if:  You are taking medicine.  You have a preexisting liver condition.  Are there any risks in my taking herbal products during menopause? If you choose to use herbal products to help with symptoms of menopause, keep in mind that:  Different supplements have  different and unmeasured amounts of herbal ingredients.  Herbal products are not regulated the same way that medicines are.  Concentrations of herbs may vary depending on the way they are prepared. For example, the concentration may be different in a pill, tea, oil, and tincture.  Little is known about the risks of using herbal products, particularly the risks of long-term use.  Some herbal supplements can be harmful when combined with certain medicines.  Most commonly reported side effects of herbal products are mild. However, if used improperly, many herbal supplements can cause serious problems. Talk to your health care provider before starting any herbal product. If problems develop, stop taking the supplement and let your health care provider know. This  information is not intended to replace advice given to you by your health care provider. Make sure you discuss any questions you have with your health care provider. Document Released: 04/28/2008 Document Revised: 10/07/2016 Document Reviewed: 04/25/2014 Elsevier Interactive Patient Education  2017 [employer]  Consider going to a seminar from [facility] about bariatric surgery.

## 2018-09-19 ENCOUNTER — Other Ambulatory Visit: Payer: Self-pay | Admitting: Neurology

## 2019-04-21 ENCOUNTER — Encounter

## 2019-04-29 ENCOUNTER — Inpatient Hospital Stay: Admit: 2019-04-29 | Payer: BLUE CROSS/BLUE SHIELD | Attending: Family Medicine | Primary: Family Medicine

## 2019-04-29 ENCOUNTER — Encounter

## 2019-04-29 DIAGNOSIS — Z1231 Encounter for screening mammogram for malignant neoplasm of breast: Secondary | ICD-10-CM

## 2019-08-02 NOTE — Progress Notes (Signed)
43 y.o. G22P2002 Married Caucasian female here for annual exam.    She has some rash on her feet.  No leg swelling.   Cycles monthly.  Taking Micronor.   She has migraine HA with exercise and increased heat.   Working during the pandemic.  Doing her work virtually.   PCP: Donald Prose, MD    Patient's last menstrual period was 07/12/2019 (exact date).     Period Cycle (Days): 30 Period Duration (Days): 4-5 days Period Pattern: Regular Menstrual Flow: Moderate Menstrual Control: Maxi pad Menstrual Control Change Freq (Hours): every 3-4 hours on heaviest day Dysmenorrhea: (!) Moderate(only occasiona cramps) Dysmenorrhea Symptoms: Cramping, Other (Comment)(back pain)     Sexually active: No.  The current method of family planning is tubal ligation.    Exercising: Yes.    walking Smoker:  no  Health Maintenance: Pap: 07/02/17 Neg:Neg HR HPV, 05-18-14 Neg:Neg HR HPV History of abnormal Pap:  Yes, 2000 hx of colposcopy but no treatment to cervix MMG: 06-04-17 Neg/density B/BiRads1--patient knows she needs to schedule Colonoscopy:  n/a BMD:   n/a  Result  n/a TDaP:  PCP Gardasil:   no HIV: Possibly with pregnancy Hep C:never Screening Labs:  PCP.    reports that she has never smoked. She has never used smokeless tobacco. She reports that she does not drink alcohol or use drugs.  Past Medical History:  Diagnosis Date  . Allergy   . Anxiety   . Asthma   . Depression   . Migraines    with aura    Past Surgical History:  Procedure Laterality Date  . CESAREAN SECTION    . KNEE SURGERY Bilateral    arthroscopic  . TUBAL LIGATION      Current Outpatient Medications  Medication Sig Dispense Refill  . ADVAIR DISKUS 100-50 MCG/DOSE AEPB Inhale 1 continuous puffing into the lungs as needed.   1  . albuterol (PROVENTIL HFA;VENTOLIN HFA) 108 (90 BASE) MCG/ACT inhaler Inhale 2 puffs into the lungs every 6 (six) hours as needed.    . fluticasone (FLONASE) 50 MCG/ACT nasal spray  Place 1 spray into both nostrils as needed.  5  . montelukast (SINGULAIR) 10 MG tablet Take 10 mg by mouth as needed.     . norethindrone (MICRONOR,CAMILA,ERRIN) 0.35 MG tablet Take 1 tablet (0.35 mg total) by mouth daily. 84 tablet 3  . propranolol (INDERAL) 10 MG tablet TAKE 1 TABLET BY MOUTH THREE TIMES A DAY AS NEEDED 270 tablet 4  . rizatriptan (MAXALT) 10 MG tablet Take 10 mg by mouth as needed. May repeat in 2 hours if needed    . UNABLE TO FIND Med Name: Juice Plus Garden Blend 2 capsules daily    . UNABLE TO FIND Med Name:  Juice Plus Orchard Blend 2 capsules daily    . UNABLE TO FIND Med Name:  Juice Plus Vineyard Blend 2 capsules daily    . UNABLE TO FIND Med Name:  Juice Plus Omega Blend 2 capsules daily    . Vitamin D, Ergocalciferol, (DRISDOL) 50000 units CAPS capsule TAKE ONE CAPSULE BY MOUTH ONE TIME PER WEEK  1   No current facility-administered medications for this visit.     Family History  Problem Relation Age of Onset  . Hyperlipidemia Mother   . Migraines Mother   . Thyroid disease Mother   . Glaucoma Mother   . Osteoporosis Mother   . Seizures Father   . Hypertension Father   . Cancer Father 69  Prostate Ca  . Glaucoma Father   . Stroke Maternal Grandmother   . Cancer Maternal Grandfather   . Migraines Maternal Grandfather   . Stroke Paternal Grandfather     Review of Systems  All other systems reviewed and are negative.   Exam:   BP 120/84 (Cuff Size: Large)   Pulse 76   Temp (!) 97.3 F (36.3 C)   Resp 14   Ht 5' 4.25" (1.632 m)   Wt 289 lb (131.1 kg)   LMP 07/12/2019 (Exact Date)   BMI 49.22 kg/m     General appearance: alert, cooperative and appears stated age Head: normocephalic, without obvious abnormality, atraumatic Neck: no adenopathy, supple, symmetrical, trachea midline and thyroid normal to inspection and palpation Lungs: clear to auscultation bilaterally Breasts: normal appearance, no masses or tenderness, No nipple  retraction or dimpling, No nipple discharge or bleeding, No axillary adenopathy Heart: regular rate and rhythm Abdomen: obese, soft, non-tender; no masses, no organomegaly Extremities: extremities normal, atraumatic, no cyanosis or edema Skin: skin color, texture, turgor normal. No rashes or lesions.  Multiple pigmented nevi.  Lymph nodes: cervical, supraclavicular, and axillary nodes normal. Neurologic: grossly normal  Pelvic: External genitalia:  no lesions              No abnormal inguinal nodes palpated.              Urethra:  normal appearing urethra with no masses, tenderness or lesions              Bartholins and Skenes: normal                 Vagina: normal appearing vagina with normal color and discharge, no lesions              Cervix: no lesions              Pap taken: No. Bimanual Exam:  Uterus:  normal size, contour, position, consistency, mobility, non-tender              Adnexa: no mass, fullness, tenderness              Rectal exam: Yes.  .  Confirms.              Anus:  normal sphincter tone, hemorrhoid.  Chaperone was present for exam.  Assessment:   Well woman visit with normal exam. Status post BTL.  On Micronor for menorrhagia. Hx cryotherapy.  Migraines with aura.  HA with exercise.  Obesity.  Hx elevated WBC over years.  Pigmented nevi.  Plan: Mammogram screening discussed. Self breast awareness reviewed. Pap and HR HPV as above. Guidelines for Calcium, Vitamin D, regular exercise program including cardiovascular and weight bearing exercise. Refill Micronor for one year. Patient will contact her PCP for referral to Heme/Onc for leukocytosis.  She will FU with her neurologist for her HAs. I encouraged her to get a blood pressure cuff and monitor her BP. She will establish care with her family dermatologist.  Follow up annually and prn.   After visit summary provided.

## 2019-08-03 ENCOUNTER — Other Ambulatory Visit: Payer: Self-pay | Admitting: Obstetrics and Gynecology

## 2019-08-03 ENCOUNTER — Encounter: Payer: Self-pay | Admitting: Obstetrics and Gynecology

## 2019-08-03 ENCOUNTER — Ambulatory Visit (INDEPENDENT_AMBULATORY_CARE_PROVIDER_SITE_OTHER): Payer: Managed Care, Other (non HMO) | Admitting: Obstetrics and Gynecology

## 2019-08-03 ENCOUNTER — Other Ambulatory Visit: Payer: Self-pay

## 2019-08-03 VITALS — BP 120/84 | HR 76 | Temp 97.3°F | Resp 14 | Ht 64.25 in | Wt 289.0 lb

## 2019-08-03 DIAGNOSIS — Z01419 Encounter for gynecological examination (general) (routine) without abnormal findings: Secondary | ICD-10-CM

## 2019-08-03 DIAGNOSIS — Z1231 Encounter for screening mammogram for malignant neoplasm of breast: Secondary | ICD-10-CM

## 2019-08-03 MED ORDER — NORETHINDRONE 0.35 MG PO TABS: 1.0000 | ORAL_TABLET | Freq: Every day | ORAL | 3 refills | Status: DC

## 2019-08-03 NOTE — Patient Instructions (Signed)

## 2019-08-12 ENCOUNTER — Telehealth: Payer: Self-pay | Admitting: Hematology

## 2019-08-12 NOTE — Telephone Encounter (Signed)
Received a new hem referral from Dr. Nancy Fetter for elevated wbc. Ms. Greenlaw cld and scheduled an appt w/Dr. Irene Limbo on 10/6 at 10am. She's been made aware to arrive 15 minutes early.

## 2019-08-29 NOTE — Progress Notes (Signed)
HEMATOLOGY/ONCOLOGY CONSULTATION NOTE  Date of Service: 08/29/2019  Patient Care Team: Deatra James, MD as PCP - General (Family Medicine)  CHIEF COMPLAINTS/PURPOSE OF CONSULTATION:  Elevated WBC  HISTORY OF PRESENTING ILLNESS:   Sarah Fields is a wonderful 43 y.o. female who has been referred to [place] by Dr Wynelle Link for evaluation and management of elevated WBC. The pt reports that she is doing well overall.  The pt reports that her WBC have been going up and down over the past couple of years. Dr. Wynelle Link, her PCP, was not extremely concerned but her OBGYN was very concerned and wanted her to be seen here. Pt denies taking any steroids for allergies but does typically experience allergy symptoms in the fall. Pt does have night sweats and fatigue, which she attributes to aging but denies fevers, chills and other infection symptoms. Her PCP thought that her fatigue was due to her Vitamin D deficiency. She currently takes weekly Vitamin D supplement. She does not take any additional Vitamin B12. Pt endorses that her fatigue has been more manageable lately and does not feel that it has been progressive. Her migraines have also improved lately. She uses Maxalt as needed and has not used steroids to treat her migraines. Pt does take Advair & Singular for her asthma prn. She has been taking Inderal daily for tremors. Pt has no family history of any blood disorders. Her mother has a thyroid disorder but pt does not. Her weight has been stable for the last few years. She has had no concern for polycystic ovarian syndrome. Pt has had no problems with tonsillitis or other concerns for mouth disease. She had a test a few years ago that r/o sleep apnea. Pt denies new bone pains and frequent vaginal yeast infections. 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 She has been working from home primarily since the beginning of the pandemic.   Most recent lab results (06/10/2019) of CBC  w/diff & CMP is as follows: WBC at 11.7K, RBC at 4.38, Hgb at 13.7, HCT at 40.3, MCV at 92.1, MCH at 31.3, MCHC at 34.0, RDW at 13.3, PLTs at 300K, MPV at 7.7, Neutro Rel at 64.7, Lymphs Rel at 16.6, Mono Rel at 5.3, Eos Rel at 3.4, Baso Rel at 0.6, Neutro Abs at 7.5K, Lymphs Abs at 3.00K, Mono Abs at 0.6K, Eos Abs at 0.4K, Baso Abs at 0.1K, nRBC Abs at 0.1K, nRBC Rel at 0.0, Glucose at 86, BUN at 12, Creatinine at 0.81, GFR Est Non Af Am at 93, Sodium at 139, Potassium at 4.7, Chloride at 104, CO2 at 27, Anion Gap at 13.0, Calcium at 9.5, CA-Alb corrected at 9.42, Total Protein at 6.8, Albumin at 4.1, Total Bilirubin at 0.6, ALP at 72, AST at 13, ALT at 9.  06/10/2019 TSH at 2.21 06/10/2019 Vitamin D 25 (OH) at 29.50  On review of systems, pt reports improving fatigue, occasional night sweats and denies back pain, dental issues, new bone pains, skin rashes, fevers, chills, unexpected weight loss, frequent yeast infections and any other symptoms.   On PMHx the pt reports Asthma,Migraines. On Social Hx the pt reports she is a non-smoker On Family Hx the pt reports her mother has thyroid disease   MEDICAL HISTORY:  Past Medical History:  Diagnosis Date   Allergy    Anxiety    Asthma    Depression    Migraines    with aura    SURGICAL  HISTORY: Past Surgical History:  Procedure Laterality Date   CESAREAN SECTION     KNEE SURGERY Bilateral    arthroscopic   TUBAL LIGATION      SOCIAL HISTORY: Social History   Socioeconomic History   Marital status: Divorced    Spouse name: Not on file   Number of children: Not on file   Years of education: Not on file   Highest education level: Not on file  Occupational History   Not on file  Social Needs   Financial resource strain: Not on file   Food insecurity    Worry: Not on file    Inability: Not on file   Transportation needs    Medical: Not on file    Non-medical: Not on file  Tobacco Use   Smoking status: Never  Smoker   Smokeless tobacco: Never Used  Substance and Sexual Activity   Alcohol use: No    Alcohol/week: 0.0 standard drinks   Drug use: No   Sexual activity: Not Currently    Partners: Male    Birth control/protection: Surgical, Pill    Comment: Tubal  Lifestyle   Physical activity    Days per week: Not on file    Minutes per session: Not on file   Stress: Not on file  Relationships   Social connections    Talks on phone: Not on file    Gets together: Not on file    Attends religious service: Not on file    Active member of club or organization: Not on file    Attends meetings of clubs or organizations: Not on file    Relationship status: Not on file   Intimate partner violence    Fear of current or ex partner: Not on file    Emotionally abused: Not on file    Physically abused: Not on file    Forced sexual activity: Not on file  Other Topics Concern   Not on file  Social History Narrative   Not on file    FAMILY HISTORY: Family History  Problem Relation Age of Onset   Hyperlipidemia Mother    Migraines Mother    Thyroid disease Mother    Glaucoma Mother    Osteoporosis Mother    Seizures Father    Hypertension Father    Cancer Father [id number]       Prostate Ca   Glaucoma Father    Stroke Maternal Grandmother    Cancer Maternal Grandfather    Migraines Maternal Grandfather    Stroke Paternal Grandfather     ALLERGIES:  is allergic to latex; other; penicillins; adhesive [tape]; and bactrim [sulfamethoxazole-trimethoprim].  MEDICATIONS:  Current Outpatient Medications  Medication Sig Dispense Refill   ADVAIR DISKUS 100-50 MCG/DOSE AEPB Inhale 1 continuous puffing into the lungs as needed.   1   albuterol (PROVENTIL HFA;VENTOLIN HFA) 108 (90 BASE) MCG/ACT inhaler Inhale 2 puffs into the lungs every 6 (six) hours as needed.     fluticasone (FLONASE) 50 MCG/ACT nasal spray Place 1 spray into both nostrils as needed.  5   montelukast  (SINGULAIR) 10 MG tablet Take 10 mg by mouth as needed.      norethindrone (MICRONOR) 0.35 MG tablet Take 1 tablet (0.35 mg total) by mouth daily. 84 tablet 3   propranolol (INDERAL) 10 MG tablet TAKE 1 TABLET BY MOUTH THREE TIMES A DAY AS NEEDED 270 tablet 4   rizatriptan (MAXALT) 10 MG tablet Take 10 mg by mouth as needed. May  repeat in 2 hours if needed     UNABLE TO FIND Med Name: Juice Plus Garden Blend 2 capsules daily     UNABLE TO FIND Med Name:  Juice Plus Orchard Blend 2 capsules daily     UNABLE TO FIND Med Name:  Juice Plus Vineyard Blend 2 capsules daily     UNABLE TO FIND Med Name:  Juice Plus Omega Blend 2 capsules daily     Vitamin D, Ergocalciferol, (DRISDOL) 50000 units CAPS capsule TAKE ONE CAPSULE BY MOUTH ONE TIME PER WEEK  1   No current facility-administered medications for this visit.     REVIEW OF SYSTEMS:    10 Point review of Systems was done is negative except as noted above.  PHYSICAL EXAMINATION: ECOG PERFORMANCE STATUS: 0 - Asymptomatic  . Vitals:   08/30/19 1041  BP: 128/89  Pulse: 79  Resp: 18  Temp: 98.2 F (36.8 C)  SpO2: 99%   Filed Weights   08/30/19 1041  Weight: 290 lb 14.4 oz (132 kg)   .Body mass index is 49.55 kg/m.  GENERAL:alert, in no acute distress and comfortable SKIN: no acute rashes, no significant lesions EYES: conjunctiva are pink and non-injected, sclera anicteric OROPHARYNX: MMM, no exudates, no oropharyngeal erythema or ulceration NECK: supple, no JVD LYMPH:  no palpable lymphadenopathy in the cervical, axillary or inguinal regions LUNGS: clear to auscultation b/l with normal respiratory effort HEART: regular rate & rhythm ABDOMEN:  normoactive bowel sounds , non tender, not distended. Extremity: no pedal edema PSYCH: alert & oriented x 3 with fluent speech NEURO: no focal motor/sensory deficits  LABORATORY DATA:  I have reviewed the data as listed  . CBC Latest Ref Rng & Units 08/30/2019 05/30/2015  05/30/2015  WBC 4.0 - 10.5 K/uL 11.2(H) 11.3(H) -  Hemoglobin 12.0 - 15.0 g/dL 16.1 09.6 04.5  Hematocrit 36.0 - 46.0 % 41.0 38.9 -  Platelets 150 - 400 K/uL 265 335 -   . CBC    Component Value Date/Time   WBC 11.2 (H) 08/30/2019 1143   RBC 4.43 08/30/2019 1143   HGB 13.6 08/30/2019 1143   HGB 12.0 05/30/2015 0856   HCT 41.0 08/30/2019 1143   PLT 265 08/30/2019 1143   MCV 92.6 08/30/2019 1143   MCH 30.7 08/30/2019 1143   MCHC 33.2 08/30/2019 1143   RDW 12.5 08/30/2019 1143   LYMPHSABS 2.9 08/30/2019 1143   MONOABS 0.7 08/30/2019 1143   EOSABS 0.3 08/30/2019 1143   BASOSABS 0.1 08/30/2019 1143    . CMP Latest Ref Rng & Units 08/30/2019 05/30/2015 05/18/2014  Glucose 70 - 99 mg/dL 87 71 82  BUN 6 - 20 mg/dL Creatinine 0.44 - 1.00 mg/dL 4.09 8.11 9.14  Sodium 135 - 145 mmol/L 140 139 141  Potassium 3.5 - 5.1 mmol/L 4.9 4.8 4.6  Chloride 98 - 111 mmol/L 106 104 103  CO2 22 - 32 mmol/L Calcium 8.9 - 10.3 mg/dL 7.8(G) 8.9 9.0  Total Protein 6.5 - 8.1 g/dL 7.0 6.5 6.6  Total Bilirubin 0.3 - 1.2 mg/dL 0.3 0.4 0.4  Alkaline Phos 38 - 126 U/L 87 87 85  AST 15 - 41 U/L 14(L) 13 13  ALT 0 - 44 U/L 12 <8 9     RADIOGRAPHIC STUDIES: I have personally reviewed the radiological images as listed and agreed with the findings in the report. No results found.  ASSESSMENT & PLAN:   1) Leucocytosis (chronic) PLAN: -Discussed  patient's most recent labs from 06/10/2019, WBC at 11.7K, RBC at 4.38, Hgb at 13.7, HCT at 40.3, MCV at 92.1, MCH at 31.3, MCHC at 34.0, RDW at 13.3, PLTs at 300K, MPV at 7.7, Neutro Rel at 64.7, Lymphs Rel at 16.6, Mono Rel at 5.3, Eos Rel at 3.4, Baso Rel at 0.6, Neutro Abs at 7.5K, Lymphs Abs at 3.00K, Mono Abs at 0.6K, Eos Abs at 0.4K, Baso Abs at 0.1K, nRBC Abs at 0.1K, nRBC Rel at 0.0, Glucose at 86, BUN at 12, Creatinine at 0.81, GFR Est Non Af Am at 93, Sodium at 139, Potassium at 4.7, Chloride at 104, CO2 at 27, Anion Gap at 13.0, Calcium at  9.5, CA-Alb corrected at 9.42, Total Protein at 6.8, Albumin at 4.1, Total Bilirubin at 0.6, ALP at 72, AST at 13, ALT at 9.  -Discussed 06/10/2019 TSH at 2.21 -Discussed 06/10/2019 Vitamin D 25 (OH) at 29.50 -Advised pt that a primary bone marrow problem would typically show ever increasing WBC, which pt does not have -Advised pt that Inderal is a beta-blocker and can trigger Asthma -Recommended pt use compression socks if she continues to work long hours on her feet -Conitinue weekly Vitamin D supplement -Recommended Vitamin B12 supplement -Goal Vitamin B12 >400 -No indication for more aggressive testing (ex. PET/CT Scans, BM Bx)  -Will get labs today  -Will see back as needed   FOLLOW UP: Labs today RTC with Dr Irene Limbo as needed based on labs  All of the patients questions were answered with apparent satisfaction. The patient knows to call the clinic with any problems, questions or concerns.  I spent 25 mins counseling the patient face to face. The total time spent in the appointment was 52mins and more than 50% was on counseling and direct patient cares.    Sullivan Lone MD [place] AAHIVMS [facility] [address] [facility] Hematology/Oncology Physician [facility]  (Office):       [phone] (Work cell):  [phone] (Fax):           [phone]  08/29/2019 8:41 PM  I, Yevette Edwards, am acting as a scribe for Dr. Sullivan Lone.   .I have reviewed the above documentation for accuracy and completeness, and I agree with the above. Brunetta Genera MD

## 2019-08-30 ENCOUNTER — Inpatient Hospital Stay: Payer: Managed Care, Other (non HMO)

## 2019-08-30 ENCOUNTER — Telehealth: Payer: Self-pay | Admitting: Hematology

## 2019-08-30 ENCOUNTER — Other Ambulatory Visit: Payer: Self-pay

## 2019-08-30 ENCOUNTER — Inpatient Hospital Stay: Payer: Managed Care, Other (non HMO) | Attending: Hematology | Admitting: Hematology

## 2019-08-30 VITALS — BP 128/89 | HR 79 | Temp 98.2°F | Resp 18 | Ht 64.25 in | Wt 290.9 lb

## 2019-08-30 DIAGNOSIS — F418 Other specified anxiety disorders: Secondary | ICD-10-CM | POA: Diagnosis not present

## 2019-08-30 DIAGNOSIS — J301 Allergic rhinitis due to pollen: Secondary | ICD-10-CM | POA: Insufficient documentation

## 2019-08-30 DIAGNOSIS — R5383 Other fatigue: Secondary | ICD-10-CM | POA: Insufficient documentation

## 2019-08-30 DIAGNOSIS — D72829 Elevated white blood cell count, unspecified: Secondary | ICD-10-CM

## 2019-08-30 DIAGNOSIS — R61 Generalized hyperhidrosis: Secondary | ICD-10-CM | POA: Insufficient documentation

## 2019-08-30 DIAGNOSIS — Z7951 Long term (current) use of inhaled steroids: Secondary | ICD-10-CM | POA: Diagnosis not present

## 2019-08-30 DIAGNOSIS — E559 Vitamin D deficiency, unspecified: Secondary | ICD-10-CM | POA: Diagnosis not present

## 2019-08-30 DIAGNOSIS — R251 Tremor, unspecified: Secondary | ICD-10-CM | POA: Diagnosis not present

## 2019-08-30 DIAGNOSIS — J45909 Unspecified asthma, uncomplicated: Secondary | ICD-10-CM | POA: Insufficient documentation

## 2019-08-30 DIAGNOSIS — Z79899 Other long term (current) drug therapy: Secondary | ICD-10-CM | POA: Diagnosis not present

## 2019-08-30 LAB — CBC WITH DIFFERENTIAL/PLATELET
Abs Immature Granulocytes: 0.21 10*3/uL — ABNORMAL HIGH (ref 0.00–0.07)
Basophils Absolute: 0.1 10*3/uL (ref 0.0–0.1)
Basophils Relative: 0 %
Eosinophils Absolute: 0.3 10*3/uL (ref 0.0–0.5)
Eosinophils Relative: 3 %
HCT: 41 % (ref 36.0–46.0)
Hemoglobin: 13.6 g/dL (ref 12.0–15.0)
Immature Granulocytes: 2 %
Lymphocytes Relative: 26 %
Lymphs Abs: 2.9 10*3/uL (ref 0.7–4.0)
MCH: 30.7 pg (ref 26.0–34.0)
MCHC: 33.2 g/dL (ref 30.0–36.0)
MCV: 92.6 fL (ref 80.0–100.0)
Monocytes Absolute: 0.7 10*3/uL (ref 0.1–1.0)
Monocytes Relative: 6 %
Neutro Abs: 7 10*3/uL (ref 1.7–7.7)
Neutrophils Relative %: 63 %
Platelets: 265 10*3/uL (ref 150–400)
RBC: 4.43 MIL/uL (ref 3.87–5.11)
RDW: 12.5 % (ref 11.5–15.5)
WBC: 11.2 10*3/uL — ABNORMAL HIGH (ref 4.0–10.5)
nRBC: 0 % (ref 0.0–0.2)

## 2019-08-30 LAB — SEDIMENTATION RATE: Sed Rate: 12 mm/hr (ref 0–22)

## 2019-08-30 LAB — CMP (CANCER CENTER ONLY)
ALT: 12 U/L (ref 0–44)
AST: 14 U/L — ABNORMAL LOW (ref 15–41)
Albumin: 3.7 g/dL (ref 3.5–5.0)
Alkaline Phosphatase: 87 U/L (ref 38–126)
Anion gap: 8 (ref 5–15)
BUN: 12 mg/dL (ref 6–20)
CO2: 26 mmol/L (ref 22–32)
Calcium: 8.8 mg/dL — ABNORMAL LOW (ref 8.9–10.3)
Chloride: 106 mmol/L (ref 98–111)
Creatinine: 0.9 mg/dL (ref 0.44–1.00)
GFR, Est AFR Am: >60 mL/min (ref >60)
GFR, Estimated: >60 mL/min (ref >60)
Glucose, Bld: 87 mg/dL (ref 70–99)
Potassium: 4.9 mmol/L (ref 3.5–5.1)
Sodium: 140 mmol/L (ref 135–145)
Total Bilirubin: 0.3 mg/dL (ref 0.3–1.2)
Total Protein: 7 g/dL (ref 6.5–8.1)

## 2019-08-30 LAB — LACTATE DEHYDROGENASE: LDH: 171 U/L (ref 98–192)

## 2019-08-30 LAB — SAVE SMEAR(SSMR), FOR PROVIDER SLIDE REVIEW

## 2019-08-30 NOTE — Patient Instructions (Signed)
Thank you for choosing [facility] to provide your oncology and hematology care.   Should you have questions after your visit to the [facility] ([facility]), please contact this office at [phone] between 8:30 AM and 4:30 PM. Voicemails left after 4:00 PM may not be returned until the following business day. Calls received after 4:30 PM will be answered by an off-site Nurse Triage Line.    Prescription Refills:  Please have your pharmacy contact us directly for most prescription requests.  Contact the office directly for refills of narcotics (pain medications). Allow 48-72 hours for refills.  Appointments: Please contact the [facility] scheduling department [phone] for questions regarding [facility] appointment scheduling.  Contact the schedulers with any scheduling changes so that your appointment can be rescheduled in a timely manner.   Central Scheduling for  [phone] - Call to schedule procedures such as PET scans, CT scans, MRI, Ultrasound, etc.  To afford each patient quality time with our providers, please arrive 30 minutes before your scheduled appointment time.  If you arrive late for your appointment, you may be asked to reschedule.  We strive to give you quality time with our providers, and arriving late affects you and other patients whose appointments are after yours. If you are a no show for multiple scheduled visits, you may be dismissed from the clinic at the providers discretion.     Resources: [facility] Social Workers [phone] for additional information on assistance programs --Anne Cunningham/Abigail Elmore  [place] DSS  [phone]: Information regarding food stamps, Medicaid, and utility assistance [employer] [phone]   [place] [employer]'s shared-ride transportation service for eligible riders who have a disability that prevents them from riding the fixed route bus.   Medicare Rights Center 800-333-4114 Helps people with  Medicare understand their rights and benefits, navigate the Medicare system, and secure the quality healthcare they deserve [employer] 800-227-2345 Assists patients locate various types of support and financial assistance Cancer Care: 1-800-813-HOPE (4673) Provides financial assistance, online support groups, medication/co-pay assistance.      

## 2019-08-30 NOTE — Telephone Encounter (Signed)
Scheduled per 10/06 los, checked patient in for labs.

## 2019-09-16 ENCOUNTER — Other Ambulatory Visit: Payer: Self-pay

## 2019-09-16 ENCOUNTER — Ambulatory Visit
Admission: RE | Admit: 2019-09-16 | Discharge: 2019-09-16 | Disposition: A | Discharge status: Home / Self Care | Payer: Managed Care, Other (non HMO) | Source: Ambulatory Visit | Attending: Obstetrics and Gynecology | Admitting: Obstetrics and Gynecology

## 2019-09-16 DIAGNOSIS — Z1231 Encounter for screening mammogram for malignant neoplasm of breast: Secondary | ICD-10-CM

## 2019-10-29 ENCOUNTER — Other Ambulatory Visit: Payer: Self-pay | Admitting: Neurology

## 2019-11-28 DIAGNOSIS — D225 Melanocytic nevi of trunk: Secondary | ICD-10-CM | POA: Diagnosis not present

## 2019-11-28 DIAGNOSIS — D2272 Melanocytic nevi of left lower limb, including hip: Secondary | ICD-10-CM | POA: Diagnosis not present

## 2019-11-28 DIAGNOSIS — D485 Neoplasm of uncertain behavior of skin: Secondary | ICD-10-CM | POA: Diagnosis not present

## 2019-11-28 DIAGNOSIS — L578 Other skin changes due to chronic exposure to nonionizing radiation: Secondary | ICD-10-CM | POA: Diagnosis not present

## 2019-11-28 DIAGNOSIS — L814 Other melanin hyperpigmentation: Secondary | ICD-10-CM | POA: Diagnosis not present

## 2019-11-28 DIAGNOSIS — D2372 Other benign neoplasm of skin of left lower limb, including hip: Secondary | ICD-10-CM | POA: Diagnosis not present

## 2019-12-16 DIAGNOSIS — J309 Allergic rhinitis, unspecified: Secondary | ICD-10-CM | POA: Diagnosis not present

## 2019-12-16 DIAGNOSIS — G43909 Migraine, unspecified, not intractable, without status migrainosus: Secondary | ICD-10-CM | POA: Diagnosis not present

## 2019-12-16 DIAGNOSIS — J45909 Unspecified asthma, uncomplicated: Secondary | ICD-10-CM | POA: Diagnosis not present

## 2019-12-18 ENCOUNTER — Other Ambulatory Visit: Payer: Self-pay | Admitting: Neurology

## 2020-01-15 ENCOUNTER — Other Ambulatory Visit: Payer: Self-pay | Admitting: Neurology

## 2020-01-19 ENCOUNTER — Emergency Department (HOSPITAL_COMMUNITY): Payer: BC Managed Care – PPO

## 2020-01-19 ENCOUNTER — Other Ambulatory Visit: Payer: Self-pay

## 2020-01-19 ENCOUNTER — Observation Stay (HOSPITAL_COMMUNITY)
Admission: EM | Admit: 2020-01-19 | Discharge: 2020-01-20 | Disposition: A | Discharge status: Home / Self Care | Payer: BC Managed Care – PPO | Attending: Internal Medicine | Admitting: Internal Medicine

## 2020-01-19 ENCOUNTER — Encounter (HOSPITAL_COMMUNITY): Payer: Self-pay | Admitting: Emergency Medicine

## 2020-01-19 DIAGNOSIS — G250 Essential tremor: Secondary | ICD-10-CM | POA: Insufficient documentation

## 2020-01-19 DIAGNOSIS — Z8249 Family history of ischemic heart disease and other diseases of the circulatory system: Secondary | ICD-10-CM | POA: Insufficient documentation

## 2020-01-19 DIAGNOSIS — Z888 Allergy status to other drugs, medicaments and biological substances status: Secondary | ICD-10-CM | POA: Insufficient documentation

## 2020-01-19 DIAGNOSIS — Z20822 Contact with and (suspected) exposure to covid-19: Secondary | ICD-10-CM | POA: Insufficient documentation

## 2020-01-19 DIAGNOSIS — Z882 Allergy status to sulfonamides status: Secondary | ICD-10-CM | POA: Diagnosis not present

## 2020-01-19 DIAGNOSIS — D72829 Elevated white blood cell count, unspecified: Secondary | ICD-10-CM | POA: Diagnosis not present

## 2020-01-19 DIAGNOSIS — R000 Tachycardia, unspecified: Secondary | ICD-10-CM | POA: Diagnosis not present

## 2020-01-19 DIAGNOSIS — J45901 Unspecified asthma with (acute) exacerbation: Principal | ICD-10-CM

## 2020-01-19 DIAGNOSIS — Z79899 Other long term (current) drug therapy: Secondary | ICD-10-CM | POA: Diagnosis not present

## 2020-01-19 DIAGNOSIS — J9601 Acute respiratory failure with hypoxia: Secondary | ICD-10-CM | POA: Insufficient documentation

## 2020-01-19 DIAGNOSIS — Z881 Allergy status to other antibiotic agents status: Secondary | ICD-10-CM | POA: Insufficient documentation

## 2020-01-19 DIAGNOSIS — G43109 Migraine with aura, not intractable, without status migrainosus: Secondary | ICD-10-CM | POA: Insufficient documentation

## 2020-01-19 DIAGNOSIS — Z7951 Long term (current) use of inhaled steroids: Secondary | ICD-10-CM | POA: Diagnosis not present

## 2020-01-19 DIAGNOSIS — Z820 Family history of epilepsy and other diseases of the nervous system: Secondary | ICD-10-CM | POA: Insufficient documentation

## 2020-01-19 DIAGNOSIS — F329 Major depressive disorder, single episode, unspecified: Secondary | ICD-10-CM | POA: Insufficient documentation

## 2020-01-19 DIAGNOSIS — Z791 Long term (current) use of non-steroidal anti-inflammatories (NSAID): Secondary | ICD-10-CM | POA: Insufficient documentation

## 2020-01-19 DIAGNOSIS — Z83511 Family history of glaucoma: Secondary | ICD-10-CM | POA: Diagnosis not present

## 2020-01-19 DIAGNOSIS — Z91048 Other nonmedicinal substance allergy status: Secondary | ICD-10-CM | POA: Insufficient documentation

## 2020-01-19 DIAGNOSIS — E781 Pure hyperglyceridemia: Secondary | ICD-10-CM | POA: Insufficient documentation

## 2020-01-19 DIAGNOSIS — J45909 Unspecified asthma, uncomplicated: Secondary | ICD-10-CM | POA: Diagnosis present

## 2020-01-19 DIAGNOSIS — Z823 Family history of stroke: Secondary | ICD-10-CM | POA: Insufficient documentation

## 2020-01-19 DIAGNOSIS — Z880 Allergy status to penicillin: Secondary | ICD-10-CM | POA: Insufficient documentation

## 2020-01-19 DIAGNOSIS — Z91040 Latex allergy status: Secondary | ICD-10-CM | POA: Insufficient documentation

## 2020-01-19 DIAGNOSIS — Z8262 Family history of osteoporosis: Secondary | ICD-10-CM | POA: Insufficient documentation

## 2020-01-19 DIAGNOSIS — R0602 Shortness of breath: Secondary | ICD-10-CM | POA: Diagnosis not present

## 2020-01-19 DIAGNOSIS — Z8349 Family history of other endocrine, nutritional and metabolic diseases: Secondary | ICD-10-CM | POA: Insufficient documentation

## 2020-01-19 DIAGNOSIS — Z7952 Long term (current) use of systemic steroids: Secondary | ICD-10-CM | POA: Diagnosis not present

## 2020-01-19 DIAGNOSIS — Z809 Family history of malignant neoplasm, unspecified: Secondary | ICD-10-CM | POA: Insufficient documentation

## 2020-01-19 DIAGNOSIS — Z8042 Family history of malignant neoplasm of prostate: Secondary | ICD-10-CM | POA: Insufficient documentation

## 2020-01-19 LAB — CBC
HCT: 44.8 % (ref 36.0–46.0)
Hemoglobin: 14.5 g/dL (ref 12.0–15.0)
MCH: 30.5 pg (ref 26.0–34.0)
MCHC: 32.4 g/dL (ref 30.0–36.0)
MCV: 94.3 fL (ref 80.0–100.0)
Platelets: 356 10*3/uL (ref 150–400)
RBC: 4.75 MIL/uL (ref 3.87–5.11)
RDW: 12.2 % (ref 11.5–15.5)
WBC: 21.5 10*3/uL — ABNORMAL HIGH (ref 4.0–10.5)
nRBC: 0 % (ref 0.0–0.2)

## 2020-01-19 LAB — RESPIRATORY PANEL BY RT PCR (FLU A&B, COVID)
Influenza A by PCR: NEGATIVE
Influenza B by PCR: NEGATIVE
SARS Coronavirus 2 by RT PCR: NEGATIVE

## 2020-01-19 LAB — BASIC METABOLIC PANEL
Anion gap: 13 (ref 5–15)
BUN: 14 mg/dL (ref 6–20)
CO2: 22 mmol/L (ref 22–32)
Calcium: 9.5 mg/dL (ref 8.9–10.3)
Chloride: 104 mmol/L (ref 98–111)
Creatinine, Ser: 0.95 mg/dL (ref 0.44–1.00)
GFR calc Af Amer: >60 mL/min (ref >60)
GFR calc non Af Amer: >60 mL/min (ref >60)
Glucose, Bld: 108 mg/dL — ABNORMAL HIGH (ref 70–99)
Potassium: 4.6 mmol/L (ref 3.5–5.1)
Sodium: 139 mmol/L (ref 135–145)

## 2020-01-19 LAB — POCT I-STAT EG7
Acid-base deficit: 2 mmol/L (ref 0.0–2.0)
Bicarbonate: 21.5 mmol/L (ref 20.0–28.0)
Calcium, Ion: 1.11 mmol/L — ABNORMAL LOW (ref 1.15–1.40)
HCT: 43 % (ref 36.0–46.0)
Hemoglobin: 14.6 g/dL (ref 12.0–15.0)
O2 Saturation: 100 %
Potassium: 3.9 mmol/L (ref 3.5–5.1)
Sodium: 138 mmol/L (ref 135–145)
TCO2: 22 mmol/L (ref 22–32)
pCO2, Ven: 31.7 mmHg — ABNORMAL LOW (ref 44.0–60.0)
pH, Ven: 7.44 — ABNORMAL HIGH (ref 7.250–7.430)
pO2, Ven: 162 mmHg — ABNORMAL HIGH (ref 32.0–45.0)

## 2020-01-19 LAB — TSH: TSH: 1.021 u[IU]/mL (ref 0.350–4.500)

## 2020-01-19 LAB — I-STAT BETA HCG BLOOD, ED (MC, WL, AP ONLY): I-stat hCG, quantitative: <5 m[IU]/mL (ref <5)

## 2020-01-19 LAB — TROPONIN I (HIGH SENSITIVITY): Troponin I (High Sensitivity): <2 ng/L (ref <18)

## 2020-01-19 LAB — HIV ANTIBODY (ROUTINE TESTING W REFLEX): HIV Screen 4th Generation wRfx: NONREACTIVE

## 2020-01-19 LAB — D-DIMER, QUANTITATIVE: D-Dimer, Quant: <0.27 ug/mL-FEU (ref 0.00–0.50)

## 2020-01-19 LAB — POC SARS CORONAVIRUS 2 AG -  ED: SARS Coronavirus 2 Ag: NEGATIVE

## 2020-01-19 MED ORDER — SODIUM CHLORIDE 0.9% FLUSH: 3.0000 mL | Freq: Once | INTRAVENOUS | Status: DC

## 2020-01-19 MED ORDER — MONTELUKAST SODIUM 10 MG PO TABS
10.0000 mg | ORAL_TABLET | Freq: Every day | ORAL | Status: DC
  Administered 2020-01-19: 10 mg via ORAL
  Filled 2020-01-19 (×2): qty 1

## 2020-01-19 MED ORDER — VITAMIN D (ERGOCALCIFEROL) 1.25 MG (50000 UNIT) PO CAPS: 50000.0000 [IU] | ORAL_CAPSULE | ORAL | Status: DC

## 2020-01-19 MED ORDER — ACETAMINOPHEN 650 MG RE SUPP: 650.0000 mg | Freq: Four times a day (QID) | RECTAL | Status: DC | PRN

## 2020-01-19 MED ORDER — SODIUM CHLORIDE 0.9 % IV BOLUS
500.0000 mL | Freq: Once | INTRAVENOUS | Status: AC
  Administered 2020-01-19: 500 mL via INTRAVENOUS

## 2020-01-19 MED ORDER — AZITHROMYCIN 250 MG PO TABS
500.0000 mg | ORAL_TABLET | Freq: Every day | ORAL | Status: DC
  Administered 2020-01-20: 500 mg via ORAL
  Filled 2020-01-19: qty 2

## 2020-01-19 MED ORDER — ALBUTEROL SULFATE HFA 108 (90 BASE) MCG/ACT IN AERS
8.0000 | INHALATION_SPRAY | Freq: Once | RESPIRATORY_TRACT | Status: AC
  Administered 2020-01-19: 8 via RESPIRATORY_TRACT
  Filled 2020-01-19: qty 6.7

## 2020-01-19 MED ORDER — IBUPROFEN 200 MG PO TABS: 400.0000 mg | ORAL_TABLET | Freq: Four times a day (QID) | ORAL | Status: DC | PRN

## 2020-01-19 MED ORDER — METHYLPREDNISOLONE SODIUM SUCC 125 MG IJ SOLR
125.0000 mg | Freq: Once | INTRAMUSCULAR | Status: AC
  Administered 2020-01-19: 125 mg via INTRAVENOUS
  Filled 2020-01-19: qty 2

## 2020-01-19 MED ORDER — PROPRANOLOL HCL 10 MG PO TABS
10.0000 mg | ORAL_TABLET | Freq: Three times a day (TID) | ORAL | Status: DC | PRN
  Administered 2020-01-20: 10 mg via ORAL
  Filled 2020-01-19 (×2): qty 1

## 2020-01-19 MED ORDER — MAGNESIUM SULFATE 2 GM/50ML IV SOLN
2.0000 g | Freq: Once | INTRAVENOUS | Status: AC
  Administered 2020-01-19: 2 g via INTRAVENOUS
  Filled 2020-01-19: qty 50

## 2020-01-19 MED ORDER — SODIUM CHLORIDE 0.9 % IV SOLN
500.0000 mg | INTRAVENOUS | Status: AC
  Administered 2020-01-19: 500 mg via INTRAVENOUS
  Filled 2020-01-19: qty 500

## 2020-01-19 MED ORDER — NORETHINDRONE 0.35 MG PO TABS: 1.0000 | ORAL_TABLET | Freq: Every day | ORAL | Status: DC

## 2020-01-19 MED ORDER — ACETAMINOPHEN 325 MG PO TABS
650.0000 mg | ORAL_TABLET | Freq: Four times a day (QID) | ORAL | Status: DC | PRN
  Administered 2020-01-19: 650 mg via ORAL
  Filled 2020-01-19: qty 2

## 2020-01-19 MED ORDER — ALBUTEROL SULFATE (2.5 MG/3ML) 0.083% IN NEBU: 3.0000 mL | INHALATION_SOLUTION | Freq: Four times a day (QID) | RESPIRATORY_TRACT | Status: DC | PRN

## 2020-01-19 MED ORDER — ALBUTEROL (5 MG/ML) CONTINUOUS INHALATION SOLN
10.0000 mg/h | INHALATION_SOLUTION | Freq: Once | RESPIRATORY_TRACT | Status: AC
  Administered 2020-01-19: 10 mg/h via RESPIRATORY_TRACT
  Filled 2020-01-19: qty 20

## 2020-01-19 MED ORDER — IPRATROPIUM BROMIDE 0.02 % IN SOLN
0.5000 mg | Freq: Four times a day (QID) | RESPIRATORY_TRACT | Status: DC
  Administered 2020-01-19: 0.5 mg via RESPIRATORY_TRACT
  Filled 2020-01-19 (×2): qty 2.5

## 2020-01-19 MED ORDER — FLUTICASONE PROPIONATE 50 MCG/ACT NA SUSP
1.0000 | Freq: Every day | NASAL | Status: DC | PRN
  Filled 2020-01-19: qty 16

## 2020-01-19 MED ORDER — GUAIFENESIN ER 600 MG PO TB12: 600.0000 mg | ORAL_TABLET | Freq: Two times a day (BID) | ORAL | Status: DC | PRN

## 2020-01-19 MED ORDER — SUMATRIPTAN SUCCINATE 50 MG PO TABS
50.0000 mg | ORAL_TABLET | ORAL | Status: DC | PRN
  Filled 2020-01-19: qty 1

## 2020-01-19 MED ORDER — PREDNISONE 20 MG PO TABS
40.0000 mg | ORAL_TABLET | Freq: Every day | ORAL | Status: DC
  Administered 2020-01-20: 40 mg via ORAL
  Filled 2020-01-19: qty 2

## 2020-01-19 MED ORDER — IPRATROPIUM BROMIDE 0.02 % IN SOLN
0.5000 mg | Freq: Three times a day (TID) | RESPIRATORY_TRACT | Status: DC
  Administered 2020-01-20: 0.5 mg via RESPIRATORY_TRACT
  Filled 2020-01-19: qty 2.5

## 2020-01-19 MED ORDER — FLUTICASONE FUROATE-VILANTEROL 100-25 MCG/INH IN AEPB
1.0000 | INHALATION_SPRAY | Freq: Every day | RESPIRATORY_TRACT | Status: DC
  Filled 2020-01-19: qty 28

## 2020-01-19 NOTE — ED Notes (Signed)
Ambulated pt on room air- HR increased to 135, pt became very SOB while ambulating. Sats eventually dropped to 87% on room air while ambulating. Sats improved to 95% on room air once pt sat. Will place on 2L [place] for comfort.

## 2020-01-19 NOTE — ED Provider Notes (Signed)
[facility] EMERGENCY DEPARTMENT Provider Note   CSN: [id number] Arrival date & time: 01/19/20  0756     History Chief Complaint  Patient presents with  . Shortness of Breath    Sarah Fields is a 44 y.o. female.  HPI She reports shortness of breath for several weeks, worsening in the last 24 hours.  She arrives by private vehicle for evaluation.  She has mild cough occasionally productive of sputum.  She denies fever, chills, chest pain, weakness or dizziness.  She has been eating well and denies anorexia.  There is been no nausea or vomiting.  She has tried her albuterol inhaler without relief.  There are no other known modifying factors.    Past Medical History:  Diagnosis Date  . Allergy   . Anxiety   . Asthma   . Depression   . Migraines    with aura    Patient Active Problem List   Diagnosis Date Noted  . Hypertriglyceridemia 05/31/2015    Past Surgical History:  Procedure Laterality Date  . CESAREAN SECTION    . KNEE SURGERY Bilateral    arthroscopic  . TUBAL LIGATION       OB History    Gravida  2   Para  2   Term  2   Preterm      AB      Living  2     SAB      TAB      Ectopic      Multiple      Live Births              Family History  Problem Relation Age of Onset  . Hyperlipidemia Mother   . Migraines Mother   . Thyroid disease Mother   . Glaucoma Mother   . Osteoporosis Mother   . Seizures Father   . Hypertension Father   . Cancer Father 69       Prostate Ca  . Glaucoma Father   . Stroke Maternal Grandmother   . Cancer Maternal Grandfather   . Migraines Maternal Grandfather   . Stroke Paternal Grandfather     Social History   Tobacco Use  . Smoking status: Never Smoker  . Smokeless tobacco: Never Used  Substance Use Topics  . Alcohol use: No    Alcohol/week: 0.0 standard drinks  . Drug use: No    Home Medications Prior to Admission medications   Medication Sig Start Date  End Date Taking? Authorizing Provider  acetaminophen (TYLENOL) 500 MG tablet Take 1,000 mg by mouth every 6 (six) hours as needed for mild pain.   Yes [provider]  ADVAIR DISKUS 100-50 MCG/DOSE AEPB Inhale 1 puff into the lungs 2 (two) times daily.  04/24/15  Yes [provider]  albuterol (PROVENTIL HFA;VENTOLIN HFA) 108 (90 BASE) MCG/ACT inhaler Inhale 2 puffs into the lungs every 6 (six) hours as needed for wheezing or shortness of breath.    Yes [provider]  fluticasone (FLONASE) 50 MCG/ACT nasal spray Place 1 spray into both nostrils daily as needed for allergies.  04/12/15  Yes [provider]  guaiFENesin (MUCINEX) 600 MG 12 hr tablet Take 600 mg by mouth 2 (two) times daily as needed for cough.   Yes [provider]  ibuprofen (ADVIL) 200 MG tablet Take 400 mg by mouth every 6 (six) hours as needed for moderate pain.   Yes [provider]  montelukast (SINGULAIR) 10  MG tablet Take 10 mg by mouth at bedtime.    Yes [provider]  norethindrone (MICRONOR) 0.35 MG tablet Take 1 tablet (0.35 mg total) by mouth daily. 08/03/19  Yes Nunzio Cobbs, MD  propranolol (INDERAL) 10 MG tablet Take 1 tablet (10 mg total) by mouth 3 (three) times daily as needed. APPOINTMENT NEEDED FOR FURTHER REFILLS. Patient taking differently: Take 10 mg by mouth 3 (three) times daily as needed (tremors). APPOINTMENT NEEDED FOR FURTHER REFILLS. 12/19/19  Yes Melvenia Beam, MD  rizatriptan (MAXALT) 10 MG tablet Take 10 mg by mouth as needed for migraine. May repeat in 2 hours if needed    Yes [provider]  UNABLE TO FIND Med Name: Juice Plus Garden Blend 2 capsules daily   Yes [provider]  UNABLE TO FIND Med Name:  Juice Plus Orchard Blend 2 capsules daily   Yes [provider]  UNABLE TO FIND Med Name:  Juice Plus Omega Blend 2 capsules daily   Yes [provider]  Vitamin D, Ergocalciferol,  (DRISDOL) 50000 units CAPS capsule Take 50,000 Units by mouth every 7 (seven) days. wednesday 06/25/18  Yes [provider]    Allergies    Latex, Other, Penicillins, Adhesive [tape], and Bactrim [sulfamethoxazole-trimethoprim]  Review of Systems   Review of Systems  All other systems reviewed and are negative.   Physical Exam Updated Vital Signs BP (!) 129/18   Pulse (!) 119   Temp 99 F (37.2 C) (Oral)   Resp (!) 22   Ht 5\' 4"  (1.626 m)   Wt 127 kg   LMP 01/09/2020   SpO2 (!) 87%   BMI 48.06 kg/m   Physical Exam Vitals and nursing note reviewed.  Constitutional:      General: She is not in acute distress.    Appearance: She is well-developed. She is obese. She is ill-appearing. She is not toxic-appearing or diaphoretic.  HENT:     Head: Normocephalic and atraumatic.     Right Ear: External ear normal.     Left Ear: External ear normal.  Eyes:     Conjunctiva/sclera: Conjunctivae normal.     Pupils: Pupils are equal, round, and reactive to light.  Neck:     Trachea: Phonation normal.  Cardiovascular:     Rate and Rhythm: Normal rate and regular rhythm.     Heart sounds: Normal heart sounds.  Pulmonary:     Effort: Pulmonary effort is normal. No respiratory distress.     Breath sounds: Normal breath sounds. No stridor. No wheezing or rhonchi.     Comments: Tachypneic.  Borderline hypoxia 92% on room air. Abdominal:     General: There is no distension.     Palpations: Abdomen is soft.     Tenderness: There is no abdominal tenderness.  Musculoskeletal:        General: No swelling, tenderness, deformity or signs of injury. Normal range of motion.     Cervical back: Normal range of motion and neck supple.  Skin:    General: Skin is warm and dry.  Neurological:     Mental Status: She is alert and oriented to person, place, and time.     Cranial Nerves: No cranial nerve deficit.     Sensory: No sensory deficit.     Motor: No abnormal muscle tone.      Coordination: Coordination normal.  Psychiatric:        Mood and Affect: Mood normal.  Behavior: Behavior normal.        Thought Content: Thought content normal.        Judgment: Judgment normal.     ED Results / Procedures / Treatments   Labs (all labs ordered are listed, but only abnormal results are displayed) Labs Reviewed  BASIC METABOLIC PANEL - Abnormal; Notable for the following components:      Result Value   Glucose, Bld 108 (*)    All other components within normal limits  CBC - Abnormal; Notable for the following components:   WBC 21.5 (*)    All other components within normal limits  RESPIRATORY PANEL BY RT PCR (FLU A&B, COVID)  D-DIMER, QUANTITATIVE (NOT AT [facility])  I-STAT BETA HCG BLOOD, ED ([facility], [facility], [facility] ONLY)  POC SARS CORONAVIRUS 2 AG -  ED  TROPONIN I (HIGH SENSITIVITY)    EKG EKG Interpretation  Date/Time:  Thursday January 19 2020 08:12:34 EST Ventricular Rate:  106 PR Interval:    QRS Duration: 73 QT Interval:  308 QTC Calculation: 409 R Axis:   49 Text Interpretation: Sinus tachycardia Right atrial enlargement Baseline wander in lead(s) V2 V4 No old tracing to compare Confirmed by Mancel Bale [phone]) on 01/19/2020 8:30:11 AM Also confirmed by Mancel Bale [phone]), editor Elita Quick ([id number])  on 01/19/2020 9:28:36 AM   Radiology DG Chest 2 View  Result Date: 01/19/2020 CLINICAL DATA:  Shortness of breath EXAM: CHEST - 2 VIEW COMPARISON:  10/03/2015 FINDINGS: Normal heart size, mediastinal contours, and pulmonary vascularity. Lungs clear. No pleural effusion or pneumothorax. Bones unremarkable. IMPRESSION: Normal exam. Electronically Signed   By: Ulyses Southward M.D.   On: 01/19/2020 08:48    Procedures .Critical Care Performed by: Mancel Bale, MD Authorized by: Mancel Bale, MD   Critical care provider statement:    Critical care time (minutes):  50   Critical care start time:  01/19/2020 8:05 AM   Critical care end time:   01/19/2020 2:54 PM   Critical care time was exclusive of:  Separately billable procedures and treating other patients   Critical care was necessary to treat or prevent imminent or life-threatening deterioration of the following conditions:  Respiratory failure   Critical care was time spent personally by me on the following activities:  Blood draw for specimens, development of treatment plan with patient or surrogate, discussions with consultants, evaluation of patient's response to treatment, examination of patient, obtaining history from patient or surrogate, ordering and performing treatments and interventions, ordering and review of laboratory studies, pulse oximetry, re-evaluation of patient's condition, review of old charts and ordering and review of radiographic studies   (including critical care time)  Medications Ordered in ED Medications  sodium chloride flush (NS) 0.9 % injection 3 mL (3 mLs Intravenous Not Given 01/19/20 0807)  sodium chloride 0.9 % bolus 500 mL (0 mLs Intravenous Stopped 01/19/20 1055)  magnesium sulfate IVPB 2 g 50 mL (0 g Intravenous Stopped 01/19/20 1055)  methylPREDNISolone sodium succinate (SOLU-MEDROL) 125 mg/2 mL injection 125 mg (125 mg Intravenous Given 01/19/20 0949)  albuterol (VENTOLIN HFA) 108 (90 Base) MCG/ACT inhaler 8 puff (8 puffs Inhalation Given 01/19/20 0949)  albuterol (PROVENTIL,VENTOLIN) solution continuous neb (10 mg/hr Nebulization Given 01/19/20 1202)    ED Course  I have reviewed the triage vital signs and the nursing notes.  Pertinent labs & imaging results that were available during my care of the patient were reviewed by me and considered in my medical decision making (see chart for details).  Clinical Course as of Jan 18 1501  Thu Jan 19, 2020  0928 No infiltrate or CHF, interpreted by me  DG Chest 2 View [EW]  [phone] Normal  I-Stat beta hCG blood, ED [EW]  [phone] Normal  Troponin I (High Sensitivity) [EW]  0929 Abnormal, elevated   CBC(!) [EW]  0929 Normal except mild elevation glucose  Basic metabolic panel(!) [EW]  0929 Normal  D-dimer, quantitative [EW]  1147 She states that she feels better at this time.  My exam, repeated, diminished abdomen bilaterally but no audible wheezes rales or rhonchi.  Oxygen saturation on room air is 90%.  Covid testing negative; both antigen, and the PCR.   [EW]  1148 Continuous nebulizer ordered   [EW]  1453 Ambulation trial on room air, she desaturated to 86% after walking and heart rate increased to 130, and she was short of breath.   [EW]    Clinical Course User Index [EW] Mancel Bale, MD   MDM Rules/Calculators/A&P                       Patient Vitals for the past 24 hrs:  BP Temp Temp src Pulse Resp SpO2 Height Weight  01/19/20 1415 (!) 129/18 - - (!) 119 (!) 22 (!) 87 % - -  01/19/20 1345 129/76 - - (!) 112 (!) 22 90 % - -  01/19/20 1202 98/84 - - (!) 105 20 92 % - -  01/19/20 1130 124/71 - - (!) 104 16 92 % - -  01/19/20 1045 113/87 - - (!) 101 13 91 % - -  01/19/20 1015 117/85 - - (!) 104 (!) 0 96 % - -  01/19/20 0845 117/73 - - (!) 107 19 90 % - -  01/19/20 0808 (!) 135/112 99 F (37.2 C) Oral (!) 105 (!) 30 93 % - -  01/19/20 0804 - - - - - - 5\' 4"  (1.626 m) 127 kg    3:02 PM Reevaluation with update and discussion. After initial assessment and treatment, an updated evaluation reveals she remains uncomfortable with ambulation, findings discussed and questions answered.   Medical Decision Making: Shortness of breath with history of asthma.  She does not use nebulizer at home.  Patient not wheezing significantly however has decreased air movement, bilaterally and hypoxia.  Her symptoms worsen with ambulation including tachycardia and hypoxia.  Maximal treatment at [facility] ED has been started, and she remains symptomatic therefore will require hospitalization.  Doubt PE, COVID-19 infection, Pneumonia, ACS, metabolic instability or impending vascular  collapse.  Sarah Fields was evaluated in Emergency Department on 01/19/2020 for the symptoms described in the history of present illness. She was evaluated in the context of the global COVID-19 pandemic, which necessitated consideration that the patient might be at risk for infection with the SARS-CoV-2 virus that causes COVID-19. Institutional protocols and algorithms that pertain to the evaluation of patients at risk for COVID-19 are in a state of rapid change based on information released by regulatory bodies including the CDC and federal and state organizations. These policies and algorithms were followed during the patient's care in the ED.   CRITICAL CARE-yes Performed by: 01/21/2020  Nursing Notes Reviewed/ Care Coordinated Applicable Imaging Reviewed Interpretation of Laboratory Data incorporated into ED treatment  2:54 PM-Consult complete with hospitalist. Patient case explained and discussed.  He agrees to admit patient for further evaluation and treatment. Call ended at 3:01 PM  Plan: Admit  Final  Clinical Impression(s) / ED Diagnoses Final diagnoses:  Moderate asthma with exacerbation, unspecified whether persistent  Acute respiratory failure with hypoxia [facility])    Rx / DC Orders ED Discharge Orders    None       Mancel Bale, MD 01/19/20 2041

## 2020-01-19 NOTE — ED Triage Notes (Addendum)
Pt states she has asthma and had a blare "flare up" a few weeks ago. Since then, she has had intermittent episodes of SOB. Last night she began to get SOB with no relief from her inhalers. Pt visibly SOB when talking and sitting. Complains of some chest tightness as well. Pt denies any exposure to anyone that has been sick.

## 2020-01-19 NOTE — Evaluation (Signed)
Bodenheimer, MD on-call triad physician made aware of expiring cardiac telemetry monitoring order. Awaiting updates in order.

## 2020-01-19 NOTE — H&P (Signed)
History and Physical    Sarah Fields GMW:[id number] DOB: December 30, 1975 DOA: 01/19/2020  PCP: Deatra James, MD   Patient coming from: Home  I have personally briefly reviewed patient's old medical records in [facility]  Chief Complaint: Cough, wheezing, SOB  HPI: Sarah Fields is a 44 y.o. female with medical history significant of Asthma, essential tremor, chronic leukocytosis for unknown etiology, and migraines, presented with increasing cough, wheezing and shortness of breath for about 4 weeks weeks, worsening in the last 24 hours.  Her symptoms has been intermittent dry cough cough occasionally productive of sputum.  She denies fever or chills, chest pain, weakness or dizziness.    She has been taking Mucinex, for the past 4 weeks, with intermittent improvement.  However since yesterday her symptoms became more constant, and she could not sleep last night. ED Course: Acute cardia and pulse ox dropped to 87% room air.  Review of Systems: As per HPI otherwise 10 point review of systems negative.    Past Medical History:  Diagnosis Date  . Allergy   . Anxiety   . Asthma   . Depression   . Migraines    with aura    Past Surgical History:  Procedure Laterality Date  . CESAREAN SECTION    . KNEE SURGERY Bilateral    arthroscopic  . TUBAL LIGATION       reports that she has never smoked. She has never used smokeless tobacco. She reports that she does not drink alcohol or use drugs.  Allergies  Allergen Reactions  . Latex   . Other     Pine apples, strawberries, banannas  . Penicillins Swelling    Patient only has allergy to the penicillin shot, she can take it orally with no reactions.  . Adhesive [Tape] Rash  . Bactrim [Sulfamethoxazole-Trimethoprim] Rash    Family History  Problem Relation Age of Onset  . Hyperlipidemia Mother   . Migraines Mother   . Thyroid disease Mother   . Glaucoma Mother   . Osteoporosis Mother   . Seizures Father    . Hypertension Father   . Cancer Father 58       Prostate Ca  . Glaucoma Father   . Stroke Maternal Grandmother   . Cancer Maternal Grandfather   . Migraines Maternal Grandfather   . Stroke Paternal Grandfather      Prior to Admission medications   Medication Sig Start Date End Date Taking? Authorizing Provider  acetaminophen (TYLENOL) 500 MG tablet Take 1,000 mg by mouth every 6 (six) hours as needed for mild pain.   Yes [provider]  ADVAIR DISKUS 100-50 MCG/DOSE AEPB Inhale 1 puff into the lungs 2 (two) times daily.  04/24/15  Yes [provider]  albuterol (PROVENTIL HFA;VENTOLIN HFA) 108 (90 BASE) MCG/ACT inhaler Inhale 2 puffs into the lungs every 6 (six) hours as needed for wheezing or shortness of breath.    Yes [provider]  fluticasone (FLONASE) 50 MCG/ACT nasal spray Place 1 spray into both nostrils daily as needed for allergies.  04/12/15  Yes [provider]  guaiFENesin (MUCINEX) 600 MG 12 hr tablet Take 600 mg by mouth 2 (two) times daily as needed for cough.   Yes [provider]  ibuprofen (ADVIL) 200 MG tablet Take 400 mg by mouth every 6 (six) hours as needed for moderate pain.   Yes [provider]  montelukast (SINGULAIR) 10 MG tablet Take 10 mg by mouth at bedtime.  Yes [provider]  norethindrone (MICRONOR) 0.35 MG tablet Take 1 tablet (0.35 mg total) by mouth daily. 08/03/19  Yes Nunzio Cobbs, MD  propranolol (INDERAL) 10 MG tablet Take 1 tablet (10 mg total) by mouth 3 (three) times daily as needed. APPOINTMENT NEEDED FOR FURTHER REFILLS. Patient taking differently: Take 10 mg by mouth 3 (three) times daily as needed (tremors). APPOINTMENT NEEDED FOR FURTHER REFILLS. 12/19/19  Yes Melvenia Beam, MD  rizatriptan (MAXALT) 10 MG tablet Take 10 mg by mouth as needed for migraine. May repeat in 2 hours if needed    Yes [provider]  UNABLE TO FIND Med Name: Juice Plus Garden  Blend 2 capsules daily   Yes [provider]  UNABLE TO FIND Med Name:  Juice Plus Orchard Blend 2 capsules daily   Yes [provider]  UNABLE TO FIND Med Name:  Juice Plus Omega Blend 2 capsules daily   Yes [provider]  Vitamin D, Ergocalciferol, (DRISDOL) 50000 units CAPS capsule Take 50,000 Units by mouth every 7 (seven) days. wednesday 06/25/18  Yes [provider]    Physical Exam: Vitals:   01/19/20 1130 01/19/20 1202 01/19/20 1345 01/19/20 1415  BP: 124/71 98/84 129/76 (!) 129/18  Pulse: (!) 104 (!) 105 (!) 112 (!) 119  Resp: 16 20 (!) 22 (!) 22  Temp:      TempSrc:      SpO2: 92% 92% 90% (!) 87%  Weight:      Height:        Constitutional: NAD, calm, comfortable Vitals:   01/19/20 1130 01/19/20 1202 01/19/20 1345 01/19/20 1415  BP: 124/71 98/84 129/76 (!) 129/18  Pulse: (!) 104 (!) 105 (!) 112 (!) 119  Resp: 16 20 (!) 22 (!) 22  Temp:      TempSrc:      SpO2: 92% 92% 90% (!) 87%  Weight:      Height:       Eyes: PERRL, lids and conjunctivae normal ENMT: Mucous membranes are moist. Posterior pharynx clear of any exudate or lesions.Normal dentition.  Neck: normal, supple, no masses, no thyromegaly Respiratory: Significant diminished breathing sound bilaterally, with scattered wheezing, no crackles.  Significant increased respiratory effort.  Positive signs of using accessory muscle breathing.  Cardiovascular: Regular rate and rhythm, no murmurs / rubs / gallops. No extremity edema. 2+ pedal pulses. No carotid bruits.  Abdomen: no tenderness, no masses palpated. No hepatosplenomegaly. Bowel sounds positive.  Musculoskeletal: no clubbing / cyanosis. No joint deformity upper and lower extremities. Good ROM, no contractures. Normal muscle tone.  Skin: no rashes, lesions, ulcers. No induration Neurologic: CN 2-12 grossly intact. Sensation intact, DTR normal. Strength 5/5 in all 4.  Psychiatric: Normal judgment and insight. Alert and  oriented x 3. Normal mood.     Labs on Admission: I have personally reviewed following labs and imaging studies  CBC: Recent Labs  Lab 01/19/20 0816  WBC 21.5*  HGB 14.5  HCT 44.8  MCV 94.3  PLT 732   Basic Metabolic Panel: Recent Labs  Lab 01/19/20 0816  NA 139  K 4.6  CL 104  CO2 22  GLUCOSE 108*  BUN 14  CREATININE 0.95  CALCIUM 9.5   GFR: Estimated Creatinine Clearance: 98.7 mL/min (by C-G formula based on SCr of 0.95 mg/dL). Liver Function Tests: No results for input(s): AST, ALT, ALKPHOS, BILITOT, PROT, ALBUMIN in the last 168 hours. No results for input(s): LIPASE, AMYLASE in the  last 168 hours. No results for input(s): AMMONIA in the last 168 hours. Coagulation Profile: No results for input(s): INR, PROTIME in the last 168 hours. Cardiac Enzymes: No results for input(s): CKTOTAL, CKMB, CKMBINDEX, TROPONINI in the last 168 hours. BNP (last 3 results) No results for input(s): PROBNP in the last 8760 hours. HbA1C: No results for input(s): HGBA1C in the last 72 hours. CBG: No results for input(s): GLUCAP in the last 168 hours. Lipid Profile: No results for input(s): CHOL, HDL, LDLCALC, TRIG, CHOLHDL, LDLDIRECT in the last 72 hours. Thyroid Function Tests: No results for input(s): TSH, T4TOTAL, FREET4, T3FREE, THYROIDAB in the last 72 hours. Anemia Panel: No results for input(s): VITAMINB12, FOLATE, FERRITIN, TIBC, IRON, RETICCTPCT in the last 72 hours. Urine analysis:    Component Value Date/Time   BILIRUBINUR n 06/13/2016 0840   PROTEINUR n 06/13/2016 0840   UROBILINOGEN negative 06/13/2016 0840   NITRITE n 06/13/2016 0840   LEUKOCYTESUR Negative 06/13/2016 0840    Radiological Exams on Admission: DG Chest 2 View  Result Date: 01/19/2020 CLINICAL DATA:  Shortness of breath EXAM: CHEST - 2 VIEW COMPARISON:  10/03/2015 FINDINGS: Normal heart size, mediastinal contours, and pulmonary vascularity. Lungs clear. No pleural effusion or pneumothorax. Bones  unremarkable. IMPRESSION: Normal exam. Electronically Signed   By: Ulyses Southward M.D.   On: 01/19/2020 08:48    EKG: Independently reviewed.  Sinus tachycardia, no acute ST-T changes  Assessment/Plan Active Problems:   Asthma attack   Asthma  Acute hypoxic respite failure secondary to acute asthma exacerbation Check a VBG for CO2 retention and pH Reported that she does have acute bronchitis every year around March, suspect allergy related.  She is already on Singulair Since her symptoms has been persisted for almost a month, will cover her with antibiotics for short-term P.o. steroid, breathing meds with Atrovent, LABA  D-dimer within normal limits  Chronic leukocytosis Has been followed with hematologist the issue Currently there is no symptoms signs of severe infection, will only to p.o. Zithromax  Essential tremors Reviewed her medication Inderal which is nonselective beta-blocker potentially trigger bronchospasm.  Patient does not report any more frequent flareup of her asthma after start taking Inderal in 2019.  Migraines Continue home meds    DVT prophylaxis: SCD Code Status: Full code Family Communication: None at bedside Disposition Plan: Probably can be discharged in next 24 hours if her symptoms improved Consults called: None Admission status: Telemetry obs   Emeline General MD [facility] Pager [phone]     01/19/2020, 3:16 PM

## 2020-01-20 DIAGNOSIS — J9601 Acute respiratory failure with hypoxia: Secondary | ICD-10-CM | POA: Diagnosis not present

## 2020-01-20 DIAGNOSIS — J45901 Unspecified asthma with (acute) exacerbation: Secondary | ICD-10-CM

## 2020-01-20 LAB — CBC
HCT: 41.8 % (ref 36.0–46.0)
Hemoglobin: 13.9 g/dL (ref 12.0–15.0)
MCH: 31.1 pg (ref 26.0–34.0)
MCHC: 33.3 g/dL (ref 30.0–36.0)
MCV: 93.5 fL (ref 80.0–100.0)
Platelets: 337 10*3/uL (ref 150–400)
RBC: 4.47 MIL/uL (ref 3.87–5.11)
RDW: 12.4 % (ref 11.5–15.5)
WBC: 17.6 10*3/uL — ABNORMAL HIGH (ref 4.0–10.5)
nRBC: 0 % (ref 0.0–0.2)

## 2020-01-20 MED ORDER — AZITHROMYCIN 250 MG PO TABS: 250.0000 mg | ORAL_TABLET | Freq: Every day | ORAL | 0 refills | Status: AC

## 2020-01-20 MED ORDER — PREDNISONE 20 MG PO TABS: 40.0000 mg | ORAL_TABLET | Freq: Every day | ORAL | 0 refills | Status: DC

## 2020-01-20 MED ORDER — SALINE SPRAY 0.65 % NA SOLN
1.0000 | NASAL | Status: DC | PRN
  Filled 2020-01-20: qty 44

## 2020-01-20 MED ORDER — SALINE SPRAY 0.65 % NA SOLN: 1.0000 | NASAL | 0 refills | Status: AC | PRN

## 2020-01-20 MED ORDER — IPRATROPIUM BROMIDE 0.02 % IN SOLN: 0.5000 mg | Freq: Four times a day (QID) | RESPIRATORY_TRACT | Status: DC | PRN

## 2020-01-20 NOTE — Plan of Care (Signed)

## 2020-01-20 NOTE — Evaluation (Signed)
Occupational Therapy Evaluation and Discharge Patient Details Name: Sarah Fields MRN: [id number] DOB: Oct 02, 1976 Today's Date: 01/20/2020    History of Present Illness Pt is a 44 y.o. female admitted 01/19/20 with cough, wheezing and SOB. Worked up for acute hypoxic respite failure secondary to acute asthma exacerbation. PMH includes asthma, migraines, anxiety, depression.   Clinical Impression   Pt is functioning independently in ADL. Educated in energy conservation strategies and provided Network engineer. All equipment needs are met in the home.     Follow Up Recommendations  No OT follow up    Equipment Recommendations  None recommended by OT    Recommendations for Other Services       Precautions / Restrictions Precautions Precautions: None Restrictions Weight Bearing Restrictions: No      Mobility Bed Mobility Overal bed mobility: Modified Independent             General bed mobility comments: HOB elevated  Transfers Overall transfer level: Independent Equipment used: None                  Balance Overall balance assessment: No apparent balance deficits (not formally assessed)   Sitting balance-Leahy Scale: Good Sitting balance - Comments: Indep to don socks sitting EOB crossing foot over opposite knee     Standing balance-Leahy Scale: Good               High level balance activites: Side stepping;Backward walking;Direction changes;Turns;Sudden stops;Head turns High Level Balance Comments: No overt instability or LOB noted with higher level balance tasks           ADL either performed or assessed with clinical judgement   ADL Overall ADL's : Independent                                       General ADL Comments: Educated at length in energy conservation strategies and provided written handout.     Vision         Perception     Praxis      Pertinent Vitals/Pain Pain Assessment: No/denies pain      Hand Dominance Right   Extremity/Trunk Assessment Upper Extremity Assessment Upper Extremity Assessment: Overall WFL for tasks assessed   Lower Extremity Assessment Lower Extremity Assessment: Defer to PT evaluation       Communication Communication Communication: No difficulties   Cognition Arousal/Alertness: Awake/alert Behavior During Therapy: WFL for tasks assessed/performed Overall Cognitive Status: Within Functional Limits for tasks assessed                                     General Comments  Initiated energy conservation education; provided aerobic activity recommendations and frequency    Exercises     Shoulder Instructions      Home Living Family/patient expects to be discharged to:: Private residence Living Arrangements: Children Available Help at Discharge: Family;Available PRN/intermittently Type of Home: House Home Access: Stairs to enter [employer] of Steps: 3 Entrance Stairs-Rails: Right Home Layout: One level     Bathroom Shower/Tub: Occupational psychologist: Standard         Additional Comments: Two kids (93 & 49 y.o.)      Prior Functioning/Environment Level of Independence: Independent        Comments: Works, drives  OT Problem List:        OT Treatment/Interventions:      OT Goals(Current goals can be found in the care plan section)    OT Frequency:     Barriers to D/C:            Co-evaluation              AM-PAC OT "6 Clicks" Daily Activity     Outcome Measure Help from another person eating meals?: None Help from another person taking care of personal grooming?: None Help from another person toileting, which includes using toliet, bedpan, or urinal?: None Help from another person bathing (including washing, rinsing, drying)?: None Help from another person to put on and taking off regular upper body clothing?: None Help from another person to put on and taking  off regular lower body clothing?: None 6 Click Score: 24   End of Session    Activity Tolerance: Patient tolerated treatment well Patient left: in bed;with call bell/phone within reach  OT Visit Diagnosis: Other (comment)(decreased activity tolerance)                Time: 8264-1583 OT Time Calculation (min): 19 min Charges:  OT General Charges $OT Visit: 1 Visit OT Evaluation $OT Eval Low Complexity: 1 Low  Malka So 01/20/2020, 11:19 AM  Nestor Lewandowsky, OTR/L Acute Rehabilitation Services Pager: [phone] Office: [phone]

## 2020-01-20 NOTE — Discharge Instructions (Signed)
Asthma Attack Prevention, Adult Although you may not be able to control the fact that you have asthma, you can take actions to prevent episodes of asthma (asthma attacks). These actions include:  Creating a written plan for managing and treating your asthma attacks (asthma action plan).  Monitoring your asthma.  Avoiding things that can irritate your airways or make your asthma symptoms worse (asthma triggers).  Taking your medicines as directed.  Acting quickly if you have signs or symptoms of an asthma attack. What are some ways to prevent an asthma attack? Create a plan Work with your health care provider to create an asthma action plan. This plan should include:  A list of your asthma triggers and how to avoid them.  A list of symptoms that you experience during an asthma attack.  Information about when to take medicine and how much medicine to take.  Information to help you understand your peak flow measurements.  Contact information for your health care providers.  Daily actions that you can take to control asthma. Monitor your asthma To monitor your asthma:  Use your peak flow meter every morning and every evening for 2-3 weeks. Record the results in a journal. A drop in your peak flow numbers on one or more days may mean that you are starting to have an asthma attack, even if you are not having symptoms.  When you have asthma symptoms, write them down in a journal.  Avoid asthma triggers Work with your health care provider to find out what your asthma triggers are. This can be done by:  Being tested for allergies.  Keeping a journal that notes when asthma attacks occur and what may have contributed to them.  Asking your health care provider whether other medical conditions make your asthma worse. Common asthma triggers include:  Dust.  Smoke. This includes campfire smoke and secondhand smoke from tobacco products.  Pet dander.  Trees, grasses or  pollens.  Very cold, dry, or humid air.  Mold.  Foods that contain high amounts of sulfites.  Strong smells.  Engine exhaust and air pollution.  Aerosol sprays and fumes from household cleaners.  Household pests and their droppings, including dust mites and cockroaches.  Certain medicines, including NSAIDs. Once you have determined your asthma triggers, take steps to avoid them. Depending on your triggers, you may be able to reduce the chance of an asthma attack by:  Keeping your home clean. Have someone dust and vacuum your home for you 1 or 2 times a week. If possible, have them use a high-efficiency particulate arrestance (HEPA) vacuum.  Washing your sheets weekly in hot water.  Using allergy-proof mattress covers and casings on your bed.  Keeping pets out of your home.  Taking care of mold and water problems in your home.  Avoiding areas where people smoke.  Avoiding using strong perfumes or odor sprays.  Avoid spending a lot of time outdoors when pollen counts are high and on very windy days.  Talking with your health care provider before stopping or starting any new medicines. Medicines Take over-the-counter and prescription medicines only as told by your health care provider. Many asthma attacks can be prevented by carefully following your medicine schedule. Taking your medicines correctly is especially important when you cannot avoid certain asthma triggers. Even if you are doing well, do not stop taking your medicine and do not take less medicine. Act quickly If an asthma attack happens, acting quickly can decrease how severe it is and   how long it lasts. Take these actions:  Pay attention to your symptoms. If you are coughing, wheezing, or having difficulty breathing, do not wait to see if your symptoms go away on their own. Follow your asthma action plan.  If you have followed your asthma action plan and your symptoms are not improving, call your health care  provider or seek immediate medical care at the nearest hospital. It is important to write down how often you need to use your fast-acting rescue inhaler. You can track how often you use an inhaler in your journal. If you are using your rescue inhaler more often, it may mean that your asthma is not under control. Adjusting your asthma treatment plan may help you to prevent future asthma attacks and help you to gain better control of your condition. How can I prevent an asthma attack when I exercise? Exercise is a common asthma trigger. To prevent asthma attacks during exercise:  Follow advice from your health care provider about whether you should use your fast-acting inhaler before exercising. Many people with asthma experience exercise-induced bronchoconstriction (EIB). This condition often worsens during vigorous exercise in cold, humid, or dry environments. Usually, people with EIB can stay very active by using a fast-acting inhaler before exercising.  Avoid exercising outdoors in very cold or humid weather.  Avoid exercising outdoors when pollen counts are high.  Warm up and cool down when exercising.  Stop exercising right away if asthma symptoms start. Consider taking part in exercises that are less likely to cause asthma symptoms such as:  Indoor swimming.  Biking.  Walking.  Hiking.  Playing football. This information is not intended to replace advice given to you by your health care provider. Make sure you discuss any questions you have with your health care provider. Document Revised: 10/23/2017 Document Reviewed: 04/26/2016 Elsevier Patient Education  2020 [employer].   http://[email or web address].org/conditions-and-treatments/asthma">  Asthma, Adult  Asthma is a long-term (chronic) condition that causes recurrent episodes in which the airways become tight and narrow. The airways are the passages that lead from the nose and mouth down into the lungs. Asthma episodes, also called  asthma attacks, can cause coughing, wheezing, shortness of breath, and chest pain. The airways can also fill with mucus. During an attack, it can be difficult to breathe. Asthma attacks can range from minor to life threatening. Asthma cannot be cured, but medicines and lifestyle changes can help control it and treat acute attacks. What are the causes? This condition is believed to be caused by inherited (genetic) and environmental factors, but its exact cause is not known. There are many things that can bring on an asthma attack or make asthma symptoms worse (triggers). Asthma triggers are different for each person. Common triggers include:  Mold.  Dust.  Cigarette smoke.  Cockroaches.  Things that can cause allergy symptoms (allergens), such as animal dander or pollen from trees or grass.  Air pollutants such as household cleaners, wood smoke, smog, or Therapist, occupational.  Cold air, weather changes, and winds (which increase molds and pollen in the air).  Strong emotional expressions such as crying or laughing hard.  Stress.  Certain medicines (such as aspirin) or types of medicines (such as beta-blockers).  Sulfites in foods and drinks. Foods and drinks that may contain sulfites include dried fruit, potato chips, and sparkling grape juice.  Infections or inflammatory conditions such as the flu, a cold, or inflammation of the nasal membranes (rhinitis).  Gastroesophageal reflux disease (GERD).  Exercise or  strenuous activity. What are the signs or symptoms? Symptoms of this condition may occur right after asthma is triggered or many hours later. Symptoms include:  Wheezing. This can sound like whistling when you breathe.  Excessive nighttime or early morning coughing.  Frequent or severe coughing with a common cold.  Chest tightness.  Shortness of breath.  Tiredness (fatigue) with minimal activity. How is this diagnosed? This condition is diagnosed based on:  Your  medical history.  A physical exam.  Tests, which may include: ? Lung function studies and pulmonary studies (spirometry). These tests can evaluate the flow of air in your lungs. ? Allergy tests. ? Imaging tests, such as X-rays. How is this treated? There is no cure for this condition, but treatment can help control your symptoms. Treatment for asthma usually involves:  Identifying and avoiding your asthma triggers.  Using medicines to control your symptoms. Generally, two types of medicines are used to treat asthma: ? Controller medicines. These help prevent asthma symptoms from occurring. They are usually taken every day. ? Fast-acting reliever or rescue medicines. These quickly relieve asthma symptoms by widening the narrow and tight airways. They are used as needed and provide short-term relief.  Using supplemental oxygen. This may be needed during a severe episode.  Using other medicines, such as: ? Allergy medicines, such as antihistamines, if your asthma attacks are triggered by allergens. ? Immune medicines (immunomodulators). These are medicines that help control the immune system.  Creating an asthma action plan. An asthma action plan is a written plan for managing and treating your asthma attacks. This plan includes: ? A list of your asthma triggers and how to avoid them. ? Information about when medicines should be taken and when their dosage should be changed. ? Instructions about using a device called a peak flow meter. A peak flow meter measures how well the lungs are working and the severity of your asthma. It helps you monitor your condition. Follow these instructions at home: Controlling your home environment Control your home environment in the following ways to help avoid triggers and prevent asthma attacks:  Change your heating and air conditioning filter regularly.  Limit your use of fireplaces and wood stoves.  Get rid of pests (such as roaches and mice) and  their droppings.  Throw away plants if you see mold on them.  Clean floors and dust surfaces regularly. Use unscented cleaning products.  Try to have someone else vacuum for you regularly. Stay out of rooms while they are being vacuumed and for a short while afterward. If you vacuum, use a dust mask from a hardware store, a double-layered or microfilter vacuum cleaner bag, or a vacuum cleaner with a HEPA filter.  Replace carpet with wood, tile, or vinyl flooring. Carpet can trap dander and dust.  Use allergy-proof pillows, mattress covers, and box spring covers.  Keep your bedroom a trigger-free room.  Avoid pets and keep windows closed when allergens are in the air.  Wash beddings every week in hot water and dry them in a dryer.  Use blankets that are made of polyester or cotton.  Clean bathrooms and kitchens with bleach. If possible, have someone repaint the walls in these rooms with mold-resistant paint. Stay out of the rooms that are being cleaned and painted.  Wash your hands often with soap and water. If soap and water are not available, use hand sanitizer.  Do not allow anyone to smoke in your home. General instructions  Take over-the-counter and  prescription medicines only as told by your health care provider. ? Speak with your health care provider if you have questions about how or when to take the medicines. ? Make note if you are requiring more frequent dosages.  Do not use any products that contain nicotine or tobacco, such as cigarettes and e-cigarettes. If you need help quitting, ask your health care provider. Also, avoid being exposed to secondhand smoke.  Use a peak flow meter as told by your health care provider. Record and keep track of the readings.  Understand and use the asthma action plan to help minimize, or stop an asthma attack, without needing to seek medical care.  Make sure you stay up to date on your yearly vaccinations as told by your health care  provider. This may include vaccines for the flu and pneumonia.  Avoid outdoor activities when allergen counts are high and when air quality is low.  Wear a ski mask that covers your nose and mouth during outdoor winter activities. Exercise indoors on cold days if you can.  Warm up before exercising, and take time for a cool-down period after exercise.  Keep all follow-up visits as told by your health care provider. This is important. Where to find more information  For information about asthma, turn to the Centers for Disease Control and Prevention at http://www.mills-berg.com/.htm  For air quality information, turn to [employer] at [employer] Contact a health care provider if:  You have wheezing, shortness of breath, or a cough even while you are taking medicine to prevent attacks.  The mucus you cough up (sputum) is thicker than usual.  Your sputum changes from clear or white to yellow, green, gray, or bloody.  Your medicines are causing side effects, such as a rash, itching, swelling, or trouble breathing.  You need to use a reliever medicine more than 2-3 times a week.  Your peak flow reading is still at 50-79% of your personal best after following your action plan for 1 hour.  You have a fever. Get help right away if:  You are getting worse and do not respond to treatment during an asthma attack.  You are short of breath when at rest or when doing very little physical activity.  You have difficulty eating, drinking, or talking.  You have chest pain or tightness.  You develop a fast heartbeat or palpitations.  You have a bluish color to your lips or fingernails.  You are light-headed or dizzy, or you faint.  Your peak flow reading is less than 50% of your personal best.  You feel too tired to breathe normally. Summary  Asthma is a long-term (chronic) condition that causes recurrent episodes in which the airways become tight and narrow. These episodes can cause  coughing, wheezing, shortness of breath, and chest pain.  Asthma cannot be cured, but medicines and lifestyle changes can help control it and treat acute attacks.  Make sure you understand how to avoid triggers and how and when to use your medicines.  Asthma attacks can range from minor to life threatening. Get help right away if you have an asthma attack and do not respond to treatment with your usual rescue medicines. This information is not intended to replace advice given to you by your health care provider. Make sure you discuss any questions you have with your health care provider. Document Revised: 01/13/2019 Document Reviewed: 12/15/2016 Elsevier Patient Education  2020 [employer]   Asthma Attack  Acute bronchospasm caused by asthma is also  referred to as an asthma attack. Bronchospasm means that the air passages become narrowed or "tight," which limits the amount of oxygen that can get into the lungs. The narrowing is caused by inflammation and tightening of the muscles in the air tubes (bronchi) in the lungs. Excessive mucus is also produced, which narrows the airways more. This can cause trouble breathing, coughing, and loud breathing (wheezing). What are the causes? Possible triggers include:  Animal dander from the skin, hair, or feathers of animals.  Dust mites contained in house dust.  Cockroaches.  Pollen from trees or grass.  Mold.  Cigarette or tobacco smoke.  Air pollutants such as dust, household cleaners, hair sprays, aerosol sprays, paint fumes, strong chemicals, or strong odors.  Cold air or weather changes. Cold air may trigger inflammation. Winds increase molds and pollens in the air.  Strong emotions such as crying or laughing hard.  Stress.  Certain medicines, such as aspirin or beta-blockers.  Sulfites in foods and drinks, such as dried fruits and wine.  Infections or inflammatory conditions, such as a flu, a cold, pneumonia, or inflammation  of the nasal membranes (rhinitis).  Gastroesophageal reflux disease (GERD). GERD is a condition in which stomach acid backs up into your esophagus, which can irritate nearby airway structures.  Exercise or activity that requires a lot of energy. What are the signs or symptoms? Symptoms of this condition include:  Wheezing. This may sound like whistling while breathing. This may be more noticeable at night.  Excessive coughing, particularly at night.  Chest tightness or pain.  Shortness of breath.  Feeling like you cannot get enough air no matter how hard you try (air hunger). How is this diagnosed? This condition may be diagnosed based on:  Your medical history.  Your symptoms.  A physical exam.  Tests to check for other causes of your symptoms or other conditions that may have triggered your asthma attack. These tests may include: ? Chest X-ray. ? Blood tests. ? Specialized tests to assess lung function, such as breathing into a device that measures how much air you inhale and exhale (spirometry). How is this treated? The goal of treatment is to open the airways in your lungs and reduce inflammation. Most asthma attacks are treated with medicines that you inhale through a hand-held inhaler (metered dose inhaler, MDI) or a device that turns liquid medicine into a mist that you inhale (nebulizer). Medicines may include:  Quick relief or rescue medicines that relax the muscles of the bronchi. These medicines include bronchodilators, such as albuterol.  Controller medicines, such as inhaled corticosteroids. These are long-acting medicines that are used for daily asthma maintenance. If you have a moderate or severe asthma attack, you may be treated with steroid medicines by mouth or through an IV injection at the hospital. Steroid medicines reduce inflammation in your lungs. Depending on the severity of your attack, you may need oxygen therapy to help you breathe. If your asthma  attack was caused by a bacterial infection, such as pneumonia, you will be given antibiotic medicines. Follow these instructions at home: Medicines  Take over-the-counter and prescription medicines only as told by your health care provider. Keep your medicines up-to-date and available.  If you are more than [redacted] weeks pregnant and you are prescribed any new medicines, tell your obstetrician about those medicines.  If you were prescribed an antibiotic medicine, take it as told by your health care provider. Do not stop taking the antibiotic even if you start to  feel better. Avoiding triggers   Keep track of things that trigger your asthma attacks or cause you to have breathing problems, and avoid exposure to these triggers.  Do not use any products that contain nicotine or tobacco, such as cigarettes and e-cigarettes. If you need help quitting, ask your health care provider.  Avoid secondhand smoke.  Avoid strong smells, such as perfumes, aerosols, and cleaning solvents.  When pollen or air pollution is bad, keep windows closed and use an air conditioner or go to places with air conditioning. Asthma action plan  Work with your health care provider to make a written plan for managing and treating your asthma attacks (asthma action plan). This plan should include: ? A list of your asthma triggers and how to avoid them. ? Information about when your medicines should be taken and when their dosage should be changed. ? Instructions about using a device called a peak flow meter to monitor your condition. A peak flow meter measures how well your lungs are working and measures how severe your asthma is at a given time. Your "personal best" is the highest peak flow rate you can reach when you feel good and have no asthma symptoms. General instructions  Avoid excessive exercise or activity until your asthma attack resolves. Ask your health care provider what activities are safe for you and when you  can return to your normal activities.  Stay up to date on all vaccinations recommended by your health care provider, such as flu and pneumonia vaccines.  Drink enough fluid to keep your urine clear or pale yellow. Staying hydrated helps keep mucus in your lungs thin so it can be coughed up easily.  If you drink caffeine, do so in moderation.  Do not use alcohol until you have recovered.  Keep all follow-up visits as told by your health care provider. This is important. Asthma requires careful medical care, and you and your health care provider can work together to reduce the likelihood of future attacks. Contact a health care provider if:  Your peak flow reading is still at 50-79% of your personal best after you have followed your action plan for 1 hour. This is in the yellow zone, which means "caution."  You need to use a reliever medicine more than 2-3 times a week.  Your medicines are causing side effects, such as: ? Rash. ? Itching. ? Swelling. ? Trouble breathing.  Your symptoms do not improve after 48 hours.  You cough up mucus (sputum) that is thicker than usual.  You have a fever.  You need to use your medicines much more frequently than normal. Get help right away if:  Your peak flow reading is less than 50% of your personal best. This is in the red zone, which means "danger."  You have severe trouble breathing.  You develop chest pain or discomfort.  Your medicines no longer seem to be helping.  You vomit.  You cannot eat or drink without vomiting.  You are coughing up yellow, green, brown, or bloody mucus.  You have a fever and your symptoms suddenly get worse.  You have trouble swallowing.  You feel very tired, and breathing becomes tiring. Summary  Acute bronchospasm caused by asthma is also referred to as an asthma attack.  Bronchospasm is caused by narrowing or tightness in air passages, which causes shortness of breath, coughing, and loud  breathing (wheezing).  Many things can trigger an asthma attack, such as allergens, weather changes, exercise, smoke, and  other fumes.  Treatment for an asthma attack may include inhaled rescue medicines for immediate relief, as well as the use of maintenance therapy.  Get help right away if you have worsening shortness of breath, chest pain, or fever, or if your home medicines are no longer helping with your symptoms. This information is not intended to replace advice given to you by your health care provider. Make sure you discuss any questions you have with your health care provider. Document Revised: 03/01/2019 Document Reviewed: 12/12/2016 Elsevier Patient Education  2020 [employer].

## 2020-01-20 NOTE — Evaluation (Signed)
Physical Therapy Evaluation & Discharge Patient Details Name: Sarah Fields MRN: [id number] DOB: September 30, 1976 Today's Date: 01/20/2020   History of Present Illness  Pt is a 44 y.o. female admitted 01/19/20 with cough, wheezing and SOB. Worked up for acute hypoxic respite failure secondary to acute asthma exacerbation. PMH includes asthma, migraines, anxiety, depression.    Clinical Impression  Patient evaluated by Physical Therapy with no further acute PT needs identified. PTA, pt independent, works and lives with children. Today, pt independent with activity and ADL tasks; limited by SOB with minimal activity (although pt reports significant improvement compared to days leading to admission); SpO2 >/96% on RA with mobility. Educ re: activity recommendations, energy conservation. All education has been completed and the patient has no further questions. Encouraged continued mobility during hospital admission. Acute PT is signing off. Thank you for this referral.    Follow Up Recommendations No PT follow up    Equipment Recommendations  None recommended by PT    Recommendations for Other Services       Precautions / Restrictions Precautions Precautions: None Restrictions Weight Bearing Restrictions: No      Mobility  Bed Mobility Overal bed mobility: Modified Independent             General bed mobility comments: HOB elevated  Transfers Overall transfer level: Independent Equipment used: None                Ambulation/Gait Ambulation/Gait assistance: Independent Gait Distance (Feet): 500 Feet Assistive device: None Gait Pattern/deviations: Step-through pattern;Decreased stride length   Gait velocity interpretation: 1.31 - 2.62 ft/sec, indicative of limited community ambulator General Gait Details: Slow, steady gait, independent without DME; intermittent standing rest breaks secondary to SOB (pt walking, talking and wearing face mask). SpO2 >/96% on  RA  Stairs Stairs: Yes Stairs assistance: Modified independent (Device/Increase time) Stair Management: One rail Right;Forwards;Alternating pattern;Step to pattern Number of Stairs: 10 General stair comments: Mod indep with single UE rail support; alternating to ascend, step-to pattern to descent, pt reports this due to chronic bilateral knee issues  Wheelchair Mobility    Modified Rankin (Stroke Patients Only)       Balance Overall balance assessment: No apparent balance deficits (not formally assessed)   Sitting balance-Leahy Scale: Good Sitting balance - Comments: Indep to don socks sitting EOB     Standing balance-Leahy Scale: Good               High level balance activites: Side stepping;Backward walking;Direction changes;Turns;Sudden stops;Head turns High Level Balance Comments: No overt instability or LOB noted with higher level balance tasks             Pertinent Vitals/Pain Pain Assessment: No/denies pain    Home Living Family/patient expects to be discharged to:: Private residence Living Arrangements: Children Available Help at Discharge: Family;Available PRN/intermittently Type of Home: House Home Access: Stairs to enter Entrance Stairs-Rails: Right Entrance Stairs-Number of Steps: 3 Home Layout: One level   Additional Comments: Two kids (18 & 62 y.o.)    Prior Function Level of Independence: Independent         Comments: Works, drives     Higher education careers adviser        Extremity/Trunk Assessment   Upper Extremity Assessment Upper Extremity Assessment: Overall WFL for tasks assessed    Lower Extremity Assessment Lower Extremity Assessment: Overall WFL for tasks assessed       Communication   Communication: No difficulties  Cognition Arousal/Alertness: Awake/alert Behavior During Therapy: WFL for  tasks assessed/performed Overall Cognitive Status: Within Functional Limits for tasks assessed                                         General Comments General comments (skin integrity, edema, etc.): Initiated energy conservation education; provided aerobic activity recommendations and frequency    Exercises     Assessment/Plan    PT Assessment Patent does not need any further PT services  PT Problem List         PT Treatment Interventions      PT Goals (Current goals can be found in the Care Plan section)  Acute Rehab PT Goals PT Goal Formulation: All assessment and education complete, DC therapy    Frequency     Barriers to discharge        Co-evaluation               AM-PAC PT "6 Clicks" Mobility  Outcome Measure Help needed turning from your back to your side while in a flat bed without using bedrails?: None Help needed moving from lying on your back to sitting on the side of a flat bed without using bedrails?: None Help needed moving to and from a bed to a chair (including a wheelchair)?: None Help needed standing up from a chair using your arms (e.g., wheelchair or bedside chair)?: None Help needed to walk in hospital room?: None Help needed climbing 3-5 steps with a railing? : None 6 Click Score: 24    End of Session   Activity Tolerance: Patient tolerated treatment well Patient left: in bed;with call bell/phone within reach Nurse Communication: Mobility status PT Visit Diagnosis: Other abnormalities of gait and mobility (R26.89)    Time: 2951-8841 PT Time Calculation (min) (ACUTE ONLY): 15 min   Charges:   PT Evaluation $PT Eval Low Complexity: 1 Low     Mabeline Caras, PT, DPT Acute Rehabilitation Services  Pager [phone] Office [place] 01/20/2020, 10:12 AM

## 2020-01-20 NOTE — Discharge Summary (Signed)
Physician Discharge Summary  Sarah Fields WCB:[id number] DOB: 10/14/1976 DOA: 01/19/2020  PCP: Deatra James, MD  Admit date: 01/19/2020 Discharge date: 01/20/2020  Admitted From: Home Discharge disposition: Home   Recommendations for Outpatient Follow-Up:   1. Close follow-up with PCP   Discharge Diagnosis:   Active Problems:   Asthma attack   Asthma    Discharge Condition: Improved.  Diet recommendation: Low sodium, heart healthy.   Wound care: None.  Code status: Full.   History of Present Illness:   Sarah Fields is a 44 y.o. female with medical history significant of Asthma, essential tremor, chronic leukocytosis for unknown etiology, and migraines, presented with increasing cough, wheezing and shortness of breath for about 4 weeks weeks, worsening in the last 24 hours.  Her symptoms has been intermittent dry cough cough occasionally productive of sputum. She denies fever or chills, chest pain, weakness or dizziness.   She has been taking Mucinex, for the past 4 weeks, with intermittent improvement.  However since yesterday her symptoms became more constant, and she could not sleep last night.   Hospital Course by Problem:   Acute hypoxic respite failure secondary to acute asthma exacerbation-much improved this a.m. Reported that she does have acute bronchitis every year around March, suspect allergy related.  She is already on Singulair Since her symptoms has been persisted for almost a month, will cover her with antibiotics for short-term P.o. steroid, breathing meds with Atrovent, LABA  D-dimer within normal limits so doubt PE  Chronic leukocytosis Has been followed with hematologist the issue Currently there is no symptoms signs of severe infection -Will treat with p.o. Zithromax  Essential tremors Reviewed her medication Inderal which is nonselective beta-blocker potentially trigger bronchospasm.  Patient does not report  any more frequent flareup of her asthma after start taking Inderal in 2019.  Migraines Continue home meds    Medical Consultants:      Discharge Exam:   Vitals:   01/20/20 0841 01/20/20 1112  BP:  (!) 123/59  Pulse:  87  Resp:  16  Temp:  98.2 F (36.8 C)  SpO2: 92% 97%   Vitals:   01/19/20 2129 01/20/20 0743 01/20/20 0841 01/20/20 1112  BP:  128/76  (!) 123/59  Pulse: (!) 108 88  87  Resp: 16 16  16   Temp:  98.2 F (36.8 C)  98.2 F (36.8 C)  TempSrc:      SpO2: 98% 97% 92% 97%  Weight:      Height:        General exam: Appears calm and comfortable.  No wheezing, moving good air.    The results of significant diagnostics from this hospitalization (including imaging, microbiology, ancillary and laboratory) are listed below for reference.     Procedures and Diagnostic Studies:   DG Chest 2 View  Result Date: 01/19/2020 CLINICAL DATA:  Shortness of breath EXAM: CHEST - 2 VIEW COMPARISON:  10/03/2015 FINDINGS: Normal heart size, mediastinal contours, and pulmonary vascularity. Lungs clear. No pleural effusion or pneumothorax. Bones unremarkable. IMPRESSION: Normal exam. Electronically Signed   By: 13/07/2015 M.D.   On: 01/19/2020 08:48     Labs:   Basic Metabolic Panel: Recent Labs  Lab 01/19/20 0816 01/19/20 1543  NA 139 138  K 4.6 3.9  CL 104  --   CO2 22  --   GLUCOSE 108*  --   BUN 14  --   CREATININE 0.95  --   CALCIUM  9.5  --    GFR Estimated Creatinine Clearance: 98.7 mL/min (by C-G formula based on SCr of 0.95 mg/dL). Liver Function Tests: No results for input(s): AST, ALT, ALKPHOS, BILITOT, PROT, ALBUMIN in the last 168 hours. No results for input(s): LIPASE, AMYLASE in the last 168 hours. No results for input(s): AMMONIA in the last 168 hours. Coagulation profile No results for input(s): INR, PROTIME in the last 168 hours.  CBC: Recent Labs  Lab 01/19/20 0816 01/19/20 1543 01/20/20 0259  WBC 21.5*  --  17.6*  HGB 14.5 14.6  13.9  HCT 44.8 43.0 41.8  MCV 94.3  --  93.5  PLT 356  --  337   Cardiac Enzymes: No results for input(s): CKTOTAL, CKMB, CKMBINDEX, TROPONINI in the last 168 hours. BNP: Invalid input(s): POCBNP CBG: No results for input(s): GLUCAP in the last 168 hours. D-Dimer Recent Labs    01/19/20 0816  DDIMER <0.27   Hgb A1c No results for input(s): HGBA1C in the last 72 hours. Lipid Profile No results for input(s): CHOL, HDL, LDLCALC, TRIG, CHOLHDL, LDLDIRECT in the last 72 hours. Thyroid function studies Recent Labs    01/19/20 1537  TSH 1.021   Anemia work up No results for input(s): VITAMINB12, FOLATE, FERRITIN, TIBC, IRON, RETICCTPCT in the last 72 hours. Microbiology Recent Results (from the past 240 hour(s))  Respiratory Panel by RT PCR (Flu A&B, Covid) - Nasopharyngeal Swab     Status: None   Collection Time: 01/19/20 10:04 AM   Specimen: Nasopharyngeal Swab  Result Value Ref Range Status   SARS Coronavirus 2 by RT PCR NEGATIVE NEGATIVE Final    Comment: (NOTE) SARS-CoV-2 target nucleic acids are NOT DETECTED. The SARS-CoV-2 RNA is generally detectable in upper respiratoy specimens during the acute phase of infection. The lowest concentration of SARS-CoV-2 viral copies this assay can detect is 131 copies/mL. A negative result does not preclude SARS-Cov-2 infection and should not be used as the sole basis for treatment or other patient management decisions. A negative result may occur with  improper specimen collection/handling, submission of specimen other than nasopharyngeal swab, presence of viral mutation(s) within the areas targeted by this assay, and inadequate number of viral copies (<131 copies/mL). A negative result must be combined with clinical observations, patient history, and epidemiological information. The expected result is Negative. Fact Sheet for Patients:  https://www.moore.com/ Fact Sheet for Healthcare Providers:    https://www.young.biz/ This test is not yet ap proved or cleared by the [place] FDA and  has been authorized for detection and/or diagnosis of SARS-CoV-2 by FDA under an Emergency Use Authorization (EUA). This EUA will remain  in effect (meaning this test can be used) for the duration of the COVID-19 declaration under Section 564(b)(1) of the Act, 21 U.S.C. section 360bbb-3(b)(1), unless the authorization is terminated or revoked sooner.    Influenza A by PCR NEGATIVE NEGATIVE Final   Influenza B by PCR NEGATIVE NEGATIVE Final    Comment: (NOTE) The Xpert Xpress SARS-CoV-2/FLU/RSV assay is intended as an aid in  the diagnosis of influenza from Nasopharyngeal swab specimens and  should not be used as a sole basis for treatment. Nasal washings and  aspirates are unacceptable for Xpert Xpress SARS-CoV-2/FLU/RSV  testing. Fact Sheet for Patients: https://www.moore.com/ Fact Sheet for Healthcare Providers: https://www.young.biz/ This test is not yet approved or cleared by the [place] FDA and  has been authorized for detection and/or diagnosis of SARS-CoV-2 by  FDA under an Emergency Use Authorization (EUA). This  EUA will remain  in effect (meaning this test can be used) for the duration of the  Covid-19 declaration under Section 564(b)(1) of the Act, 21  U.S.C. section 360bbb-3(b)(1), unless the authorization is  terminated or revoked. Performed at [facility], [place] [address] [place], [place] [address]      Discharge Instructions:   Discharge Instructions    Diet general   Complete by: As directed    Increase activity slowly   Complete by: As directed      Allergies as of 01/20/2020      Reactions   Latex    Other    Pine apples, strawberries, banannas   Penicillins Swelling   Patient only has allergy to the penicillin shot, she can take it orally with no reactions.   Adhesive [tape] Rash    Bactrim [sulfamethoxazole-trimethoprim] Rash      Medication List    STOP taking these medications   ibuprofen 200 MG tablet Commonly known as: ADVIL   propranolol 10 MG tablet Commonly known as: INDERAL     TAKE these medications   acetaminophen 500 MG tablet Commonly known as: TYLENOL Take 1,000 mg by mouth every 6 (six) hours as needed for mild pain.   Advair Diskus 100-50 MCG/DOSE Aepb Generic drug: Fluticasone-Salmeterol Inhale 1 puff into the lungs 2 (two) times daily.   albuterol 108 (90 Base) MCG/ACT inhaler Commonly known as: VENTOLIN HFA Inhale 2 puffs into the lungs every 6 (six) hours as needed for wheezing or shortness of breath.   azithromycin 250 MG tablet Commonly known as: ZITHROMAX Take 1 tablet (250 mg total) by mouth daily for 4 days.   fluticasone 50 MCG/ACT nasal spray Commonly known as: FLONASE Place 1 spray into both nostrils daily as needed for allergies.   guaiFENesin 600 MG 12 hr tablet Commonly known as: MUCINEX Take 600 mg by mouth 2 (two) times daily as needed for cough.   montelukast 10 MG tablet Commonly known as: SINGULAIR Take 10 mg by mouth at bedtime.   norethindrone 0.35 MG tablet Commonly known as: MICRONOR Take 1 tablet (0.35 mg total) by mouth daily.   predniSONE 20 MG tablet Commonly known as: DELTASONE Take 2 tablets (40 mg total) by mouth daily with breakfast. Start taking on: January 21, 2020   rizatriptan 10 MG tablet Commonly known as: MAXALT Take 10 mg by mouth as needed for migraine. May repeat in 2 hours if needed   sodium chloride 0.65 % Soln nasal spray Commonly known as: OCEAN Place 1 spray into both nostrils as needed for congestion.   UNABLE TO FIND Med Name: Juice Plus Garden Blend 2 capsules daily   UNABLE TO FIND Med Name:  Juice Plus Orchard Blend 2 capsules daily   UNABLE TO FIND Med Name:  Juice Plus Omega Blend 2 capsules daily   Vitamin D (Ergocalciferol) 1.25 MG (50000 UNIT) Caps  capsule Commonly known as: DRISDOL Take 50,000 Units by mouth every 7 (seven) days. wednesday      Follow-up Information    Donald Prose, MD Follow up in 1 week(s).   Specialty: Family Medicine Contact information: [address] [place] [address] [phone]            Time coordinating discharge: 25 minutes  Signed:  Geradine Girt DO  [facility] 01/20/2020, 2:56 PM

## 2020-01-20 NOTE — Progress Notes (Signed)
Nutrition Brief Note  RD working remotely.  RD consulted for assessment of nutrition requirements/ status.   Wt Readings from Last 15 Encounters:  01/19/20 127 kg  08/30/19 132 kg  08/03/19 131.1 kg  07/14/18 130.2 kg  08/26/17 123.2 kg  07/02/17 123.4 kg  06/13/16 120.2 kg  10/03/15 127 kg  07/06/14 125.7 kg  06/22/14 124.3 kg  05/18/14 126.6 kg  02/04/14 124.3 kg  10/02/12 112 kg   Sarah Fields is a 44 y.o. female with medical history significant of Asthma, essential tremor, chronic leukocytosis for unknown etiology, and migraines, presented with increasing cough, wheezing and shortness of breath for about 4 weeks weeks, worsening in the last 24 hours.  Her symptoms has been intermittent dry cough cough occasionally productive of sputum.  She denies fever or chills, chest pain, weakness or dizziness.    She has been taking Mucinex, for the past 4 weeks, with intermittent improvement.  However since yesterday her symptoms became more constant, and she could not sleep last night.  Pt admitted with acute hypoxic respiratory failure secondary to asthma exacerbation.   Reviewed I/O's: +1.4 L x 24 hours  Attempted to speak with pt via phone, however, no answer.   Pt with good oral intake per doc flowsheets, consuming 100% of meals.  Reviewed wt hx; pt has experienced a 3.1% wt loss over the past 6 months, which is not significant for time frame. Obesity is a complex, chronic medical condition that is optimally managed by a multidisciplinary care team. Weight loss is not an ideal goal for an acute inpatient hospitalization. However, if further work-up for obesity is warranted, consider outpatient referral to outpatient bariatric service and/or [facility].   Medications reviewed and include prednisone.   Labs reviewed.   Current diet order is regular, patient is consuming approximately 100% of meals at this time. Labs and  medications reviewed.   No nutrition interventions warranted at this time. If nutrition issues arise, please consult RD.   Levada Schilling, RD, LDN, CDCES Registered Dietitian II Certified Diabetes Care and Education Specialist Please refer to [facility] for RD and/or RD on-call/weekend/after hours pager

## 2020-01-27 DIAGNOSIS — J45901 Unspecified asthma with (acute) exacerbation: Secondary | ICD-10-CM | POA: Diagnosis not present

## 2020-01-27 DIAGNOSIS — G250 Essential tremor: Secondary | ICD-10-CM | POA: Diagnosis not present

## 2020-02-09 DIAGNOSIS — J3081 Allergic rhinitis due to animal (cat) (dog) hair and dander: Secondary | ICD-10-CM | POA: Diagnosis not present

## 2020-02-09 DIAGNOSIS — J4540 Moderate persistent asthma, uncomplicated: Secondary | ICD-10-CM | POA: Diagnosis not present

## 2020-02-09 DIAGNOSIS — J301 Allergic rhinitis due to pollen: Secondary | ICD-10-CM | POA: Diagnosis not present

## 2020-02-09 DIAGNOSIS — R05 Cough: Secondary | ICD-10-CM | POA: Diagnosis not present

## 2020-02-15 DIAGNOSIS — J3081 Allergic rhinitis due to animal (cat) (dog) hair and dander: Secondary | ICD-10-CM | POA: Diagnosis not present

## 2020-02-15 DIAGNOSIS — J301 Allergic rhinitis due to pollen: Secondary | ICD-10-CM | POA: Diagnosis not present

## 2020-02-16 DIAGNOSIS — J3089 Other allergic rhinitis: Secondary | ICD-10-CM | POA: Diagnosis not present

## 2020-02-29 DIAGNOSIS — J3089 Other allergic rhinitis: Secondary | ICD-10-CM | POA: Diagnosis not present

## 2020-02-29 DIAGNOSIS — J3081 Allergic rhinitis due to animal (cat) (dog) hair and dander: Secondary | ICD-10-CM | POA: Diagnosis not present

## 2020-02-29 DIAGNOSIS — J301 Allergic rhinitis due to pollen: Secondary | ICD-10-CM | POA: Diagnosis not present

## 2020-03-02 DIAGNOSIS — J3081 Allergic rhinitis due to animal (cat) (dog) hair and dander: Secondary | ICD-10-CM | POA: Diagnosis not present

## 2020-03-02 DIAGNOSIS — J301 Allergic rhinitis due to pollen: Secondary | ICD-10-CM | POA: Diagnosis not present

## 2020-03-02 DIAGNOSIS — J3089 Other allergic rhinitis: Secondary | ICD-10-CM | POA: Diagnosis not present

## 2020-03-06 DIAGNOSIS — J3089 Other allergic rhinitis: Secondary | ICD-10-CM | POA: Diagnosis not present

## 2020-03-06 DIAGNOSIS — J301 Allergic rhinitis due to pollen: Secondary | ICD-10-CM | POA: Diagnosis not present

## 2020-03-06 DIAGNOSIS — J3081 Allergic rhinitis due to animal (cat) (dog) hair and dander: Secondary | ICD-10-CM | POA: Diagnosis not present

## 2020-03-09 DIAGNOSIS — J3089 Other allergic rhinitis: Secondary | ICD-10-CM | POA: Diagnosis not present

## 2020-03-09 DIAGNOSIS — J301 Allergic rhinitis due to pollen: Secondary | ICD-10-CM | POA: Diagnosis not present

## 2020-03-09 DIAGNOSIS — J3081 Allergic rhinitis due to animal (cat) (dog) hair and dander: Secondary | ICD-10-CM | POA: Diagnosis not present

## 2020-03-12 NOTE — Progress Notes (Signed)
[facility]    Provider:  Dr Jaynee Eagles Requesting Provider: Donald Prose, MD Primary Care Provider:  Donald Prose, MD  CC:  tremor  HPI:  Sarah Fields is a 44 y.o. female here as requested by Donald Prose, MD for tremors.  I reviewed Dr. Lynnda Child notes, patient has a past medical history of migraine, anxiety, insomnia, essential tremor, asthma, morbid obesity, unspecified leukocytosis, gestational diabetes, vitamin D deficiency.  Appears that she was tried on propranolol but worsened her asthma, propranolol was for tremors.  She was recently admitted in February for respiratory symptoms admitted for asthma exacerbation.  She was advised to stop the propranolol upon discharge from the hospital, with this change her tremors have become significantly worse, she also has a history of anxiety but is not on any treatment for it, she is not able to differentiate if the shaking is from her tremors or her mood with feeling nervous.  Of note, when she was seen in January 2021 she had not had a migraine in over 4 months but was having some headaches, experiencing light sensitivity and nausea with the migraines occasionally vomiting, Maxalt works acutely, he can trigger the migraines, her allergies are triggered by dust pollen mold and grass.  She apparently did see a neurologist in the past to evaluate her essential tremor and she was put on propranolol.  Tremor started in 2018. Still mostly the right arm occasionally to the left, action when she is writing and postural. She catches herself making fists because she can't see the tremor as much. She can feel the shaking inside. Propranolol helped but had asthma attack, she tried it again and had asthma symptoms. She is here alone. She has essential tremor, was treated with propranolol but can no longer tolerate due to asthma.Since she has been off of the medication it is worsening, problems writing and typing. Dad had tremors too, he also  has epilepsy. No weakness, no numbness or paresthesias. She has not noticed it in her voice or head but son noticed it in the past . Progressive and worsening. No significant caffeine. TSH checked in the past. She has migraines, they have been better lately, maxalt helps but she has to go to sleep, triggers are allergies and dust and she is seeing an asthma doctor/allergist for that now started allergy shots 2 weeks ago and feeling better. She snores, she did a sleep study and was negative, she thinks it is the allergies as well because since switching allergy meds the snoring is reduced. No other focal neurologic deficits, associated symptoms, inciting events or modifiable factors.    Reviewed notes, labs and imaging from outside physicians, which showed:  THS normal 01/19/2020, bmp bun/cr 14/0.95  Review of Systems: Patient complains of symptoms per HPI as well as the following symptoms: tremor. Pertinent negatives and positives per HPI. All others negative.   Social History   Socioeconomic History  . Marital status: Divorced    Spouse name: Not on file  . Number of children: 2  . Years of education: Not on file  . Highest education level: Master's degree (e.g., MA, MS, MEng, MEd, MSW, MBA)  Occupational History  . Not on file  Tobacco Use  . Smoking status: Never Smoker  . Smokeless tobacco: Never Used  Substance and Sexual Activity  . Alcohol use: Never    Alcohol/week: 0.0 standard drinks  . Drug use: Never  . Sexual activity: Not Currently    Partners: Male  Birth control/protection: Surgical, Pill    Comment: Tubal  Other Topics Concern  . Not on file  Social History Narrative   Lives at home with her children   Right handed   Caffeine: 1 glass of tea per day?   Social Determinants of Health   Financial Resource Strain:   . Difficulty of Paying Living Expenses:   Food Insecurity:   . Worried About Programme researcher, broadcasting/film/video in the Last Year:   . Barista in the  Last Year:   Transportation Needs:   . Freight forwarder (Medical):   Marland Kitchen Lack of Transportation (Non-Medical):   Physical Activity:   . Days of Exercise per Week:   . Minutes of Exercise per Session:   Stress:   . Feeling of Stress :   Social Connections:   . Frequency of Communication with Friends and Family:   . Frequency of Social Gatherings with Friends and Family:   . Attends Religious Services:   . Active Member of Clubs or Organizations:   . Attends Banker Meetings:   Marland Kitchen Marital Status:   Intimate Partner Violence:   . Fear of Current or Ex-Partner:   . Emotionally Abused:   Marland Kitchen Physically Abused:   . Sexually Abused:     Family History  Problem Relation Age of Onset  . Hyperlipidemia Mother   . Migraines Mother   . Thyroid disease Mother   . Glaucoma Mother   . Osteoporosis Mother   . Seizures Father   . Hypertension Father   . Cancer Father 9       Prostate Ca  . Glaucoma Father   . Stroke Maternal Grandmother   . Cancer Maternal Grandfather   . Migraines Maternal Grandfather   . Stroke Paternal Grandfather     Past Medical History:  Diagnosis Date  . Allergy   . Anxiety   . Asthma   . Depression   . Essential tremor   . Gestational diabetes    h/o  . Migraines    with aura  . Tenosynovitis, de Quervain     Patient Active Problem List   Diagnosis Date Noted  . Essential tremor 03/13/2020  . Asthma attack 01/19/2020  . Asthma 01/19/2020  . Acute respiratory failure with hypoxia (HCC)   . Hypertriglyceridemia 05/31/2015    Past Surgical History:  Procedure Laterality Date  . CESAREAN SECTION     2001, 2003  . KNEE SURGERY Bilateral    arthroscopic  . TUBAL LIGATION    . WISDOM TOOTH EXTRACTION      Current Outpatient Medications  Medication Sig Dispense Refill  . acetaminophen (TYLENOL) 500 MG tablet Take 1,000 mg by mouth every 6 (six) hours as needed for mild pain.    Marland Kitchen albuterol (PROVENTIL HFA;VENTOLIN HFA) 108 (90  BASE) MCG/ACT inhaler Inhale 2 puffs into the lungs every 6 (six) hours as needed for wheezing or shortness of breath.     . Azelastine-Fluticasone 137-50 MCG/ACT SUSP Place 1 spray into both nostrils 2 (two) times daily.    Marland Kitchen EPINEPHrine 0.3 mg/0.3 mL IJ SOAJ injection INJECT IN OUTER THIGH AS NEEDED FOR ANAPHYLAXIS    . guaiFENesin (MUCINEX) 600 MG 12 hr tablet Take 600 mg by mouth 2 (two) times daily as needed for cough.    . levocetirizine (XYZAL) 5 MG tablet SMARTSIG:1 Tablet(s) By Mouth Every Evening    . montelukast (SINGULAIR) 10 MG tablet Take 10 mg by mouth at bedtime.     Marland Kitchen  norethindrone (INCASSIA) 0.35 MG tablet Take 1 tablet by mouth daily.    Marland Kitchen PRESCRIPTION MEDICATION 2 (two) times a week. Allergy Shots    . rizatriptan (MAXALT) 10 MG tablet Take 10 mg by mouth as needed for migraine. May repeat in 2 hours if needed     . sodium chloride (OCEAN) 0.65 % SOLN nasal spray Place 1 spray into both nostrils as needed for congestion.  0  . SYMBICORT 160-4.5 MCG/ACT inhaler     . UNABLE TO FIND Med Name: Juice Plus Garden Blend 2 capsules daily    . UNABLE TO FIND Med Name:  Juice Plus Orchard Blend 2 capsules daily    . UNABLE TO FIND Med Name:  Juice Plus Omega Blend 2 capsules daily    . Vitamin D, Ergocalciferol, (DRISDOL) 50000 units CAPS capsule Take 50,000 Units by mouth every 7 (seven) days. wednesday  1  . primidone (MYSOLINE) 50 MG tablet Take 1 tablet (50 mg total) by mouth at bedtime. 30 tablet 4   No current facility-administered medications for this visit.    Allergies as of 03/13/2020 - Review Complete 03/13/2020  Allergen Reaction Noted  . Latex  02/04/2014  . Other  02/04/2014  . Penicillins Swelling 02/04/2014  . Propranolol  03/13/2020  . Adhesive [tape] Rash 10/03/2015  . Bactrim [sulfamethoxazole-trimethoprim] Rash 06/13/2016    Vitals: BP 127/84 (BP Location: Left Arm, Patient Position: Sitting, Cuff Size: Large)   Pulse 84   Temp (!) 97.3 F (36.3 C)  Comment: taken at from  Ht 5\' 4"  (1.626 m)   Wt 292 lb (132.5 kg)   BMI 50.12 kg/m  Last Weight:  Wt Readings from Last 1 Encounters:  03/13/20 292 lb (132.5 kg)   Last Height:   Ht Readings from Last 1 Encounters:  03/13/20 5\' 4"  (1.626 m)     Physical exam: Exam: Gen: NAD, conversant, well nourised, obese, well groomed                     CV: RRR, no MRG. No Carotid Bruits. No peripheral edema, warm, nontender Eyes: Conjunctivae clear without exudates or hemorrhage  Neuro: Detailed Neurologic Exam  Speech:    Speech is normal; fluent and spontaneous with normal comprehension.  Cognition:    The patient is oriented to person, place, and time;     recent and remote memory intact;     language fluent;     normal attention, concentration,     fund of knowledge Cranial Nerves:    The pupils are equal, round, and reactive to light. The fundi are normal and spontaneous venous pulsations are present. Visual fields are full to finger confrontation. Extraocular movements are intact. Trigeminal sensation is intact and the muscles of mastication are normal. The face is symmetric. The palate elevates in the midline. Hearing intact. Voice is normal. Shoulder shrug is normal. The tongue has normal motion without fasciculations.   Coordination:    Normal finger to nose and heel to shin.   Gait:   Normal native gait  Motor Observation:    Postural and action tremor, high frequency low amplitude right > left Tone:    Normal muscle tone.    Posture:    Posture is normal. normal erect    Strength:    Strength is V/V in the upper and lower limbs.      Sensation: intact to LT     Reflex Exam:  DTR's:    Deep tendon  reflexes in the upper and lower extremities are normal bilaterally.   Toes:    The toes are downgoing bilaterally.   Clonus:    Clonus is absent.    Assessment/Plan:  [age] year old with worsening essential tremor.  Is doing extremely well on propranolol but  started affecting her allergies and asthma.  Today we had a long talk about essential tremor, progression including to the other arm/head/voice as well as feeling tremors internally, no signs of parkinsonism in this patient.  We had a long talk about possibilities including primidone, Neurontin and Topamax (Topamax may be best for her migraines but she feels her migraines are improving with allergy medications).  Today we will try primidone.  If she develops spotting then we may have to switch as primidone may decrease effectiveness of birth control, watch for spotting and breakthrough bleeding.  Also watch for sedation if 50 mg makes her too sedated she can cut the pill in half at night.  We can also slowly increase up to 250 mg.   Meds ordered this encounter  Medications  . primidone (MYSOLINE) 50 MG tablet    Sig: Take 1 tablet (50 mg total) by mouth at bedtime.    Dispense:  30 tablet    Refill:  4    Cc: Deatra James, MD,    Naomie Dean, MD  [facility] [address] [place], [place] [address]  Phone [phone] Fax [phone]

## 2020-03-13 ENCOUNTER — Encounter: Payer: Self-pay | Admitting: Neurology

## 2020-03-13 ENCOUNTER — Other Ambulatory Visit: Payer: Self-pay

## 2020-03-13 ENCOUNTER — Ambulatory Visit: Payer: BC Managed Care – PPO | Admitting: Neurology

## 2020-03-13 ENCOUNTER — Encounter: Payer: Self-pay | Admitting: *Deleted

## 2020-03-13 VITALS — BP 127/84 | HR 84 | Temp 97.3°F | Ht 64.0 in | Wt 292.0 lb

## 2020-03-13 DIAGNOSIS — G250 Essential tremor: Secondary | ICD-10-CM | POA: Insufficient documentation

## 2020-03-13 MED ORDER — PRIMIDONE 50 MG PO TABS: 50.0000 mg | ORAL_TABLET | Freq: Every day | ORAL | 4 refills | Status: DC

## 2020-03-13 NOTE — Patient Instructions (Signed)
- Start primidone 50 mg at bedtime, if this is too sedating please cut tablet in half.  We can also increase this medication as needed. -If you notice spotting or breakthrough bleeding, we may need to change it, primidone may interact with your birth control.  Watch for spotting or breakthrough bleeding. -Other medications include gabapentin/Neurontin or topiramate.  Primidone tablets What is this medicine? PRIMIDONE (PRI mi done) is a barbiturate. This medicine is used to control seizures in certain types of epilepsy. It is not for use in absence (petit mal) seizures. Also for essential tremor. This medicine may be used for other purposes; ask your health care provider or pharmacist if you have questions. COMMON BRAND NAME(S): Mysoline What should I tell my health care provider before I take this medicine? They need to know if you have any of these conditions:  kidney disease  liver disease  porphyria  suicidal thoughts, plans, or attempt; a previous suicide attempt by you or a family member  an unusual or allergic reaction to primidone, phenobarbital, other barbiturates or seizure medications, other medicines, foods, dyes, or preservatives  pregnant or trying to get pregnant  breast-feeding How should I use this medicine? Take this medicine by mouth with a glass of water. Follow the directions on the prescription label. Take your doses at regular intervals. Do not take your medicine more often than directed. Do not stop taking except on the advice of your doctor or health care professional. A special MedGuide will be given to you by the pharmacist with each prescription and refill. Be sure to read this information carefully each time. Contact your pediatrician or health care professional regarding the use of this medication in children. Special care may be needed. While this drug may be prescribed for children for selected conditions, precautions do apply. Overdosage: If you think you  have taken too much of this medicine contact a poison control center or emergency room at once. NOTE: This medicine is only for you. Do not share this medicine with others. What if I miss a dose? If you miss a dose, take it as soon as you can. If it is almost time for your next dose, take only that dose. Do not take double or extra doses. What may interact with this medicine? Do not take this medicine with any of the following medications:  voriconazole This medicine may also interact with the following medications:  cancer-treating medications  cyclosporine  disopyramide  doxycycline  female hormones, including contraceptive or birth control pills  medicines for mental depression, anxiety or other mood problems  medicines for treating HIV infection or AIDS  modafinil  prescription pain medications  quinidine  warfarin This list may not describe all possible interactions. Give your health care provider a list of all the medicines, herbs, non-prescription drugs, or dietary supplements you use. Also tell them if you smoke, drink alcohol, or use illegal drugs. Some items may interact with your medicine. What should I watch for while using this medicine? Visit your doctor or health care professional for regular checks on your progress. It may be 2 to 3 weeks before you see the full effects of this medicine. Do not suddenly stop taking this medicine, you may increase the risk of seizures. Your doctor or health care professional may want to gradually reduce the dose. Wear a medical identification bracelet or chain to say you have epilepsy, and carry a card that lists all your medications. You may get drowsy or dizzy. Do not  drive, use machinery, or do anything that needs mental alertness until you know how this medicine affects you. Do not stand or sit up quickly, especially if you are an older patient. This reduces the risk of dizzy or fainting spells. Alcohol may interfere with the  effect of this medicine. Avoid alcoholic drinks. Birth control pills may not work properly while you are taking this medicine. Talk to your doctor about using an extra method of birth control. The use of this medicine may increase the chance of suicidal thoughts or actions. Pay special attention to how you are responding while on this medicine. Any worsening of mood, or thoughts of suicide or dying should be reported to your health care professional right away. Women who become pregnant while using this medicine may enroll in the [place] American Antiepileptic Drug Pregnancy Registry by calling [phone]. This registry collects information about the safety of antiepileptic drug use during pregnancy. This medicine may cause a decrease in vitamin D and folic acid. You should make sure that you get enough vitamins while you are taking this medicine. Discuss the foods you eat and the vitamins you take with your health care professional. What side effects may I notice from receiving this medicine? Side effects that you should report to your doctor or health care professional as soon as possible:  allergic reactions like skin rash, itching or hives, swelling of the face, lips, or tongue  blurred, double vision, or uncontrollable rolling or movements of the eyes  redness, blistering, peeling or loosening of the skin, including inside the mouth  shortness of breath or difficulty breathing  unusual excitement or restlessness, more likely in children and the elderly  unusually weak or tired  worsening of mood, thoughts or actions of suicide or dying Side effects that usually do not require medical attention (report to your doctor or health care professional if they continue or are bothersome):  clumsiness, unsteadiness, or a hang-over effect  decreased sexual ability  dizziness, drowsiness  loss of appetite  nausea or vomiting This list may not describe all possible side effects. Call your  doctor for medical advice about side effects. You may report side effects to FDA at 1-800-FDA-1088. Where should I keep my medicine? Keep out of the reach of children. This medicine may cause accidental overdose and death if it taken by other adults, children, or pets. Mix any unused medicine with a substance like cat litter or coffee grounds. Then throw the medicine away in a sealed container like a sealed bag or a coffee can with a lid. Do not use the medicine after the expiration date. Store at room temperature between 15 and 30 degrees C (59 and 86 degrees F). NOTE: This sheet is a summary. It may not cover all possible information. If you have questions about this medicine, talk to your doctor, pharmacist, or health care provider.  2020 Elsevier/Gold Standard (2017-06-24 15:21:47)  Essential Tremor A tremor is trembling or shaking that a person cannot control. Most tremors affect the hands or arms. Tremors can also affect the head, vocal cords, legs, and other parts of the body. Essential tremor is a tremor without a known cause. Usually, it occurs while a person is trying to perform an action. It tends to get worse gradually as a person ages. What are the causes? The cause of this condition is not known. What increases the risk? You are more likely to develop this condition if:  You have a family member with essential tremor.  You are age 85 or older.  You take certain medicines. What are the signs or symptoms? The main sign of a tremor is a rhythmic shaking of certain parts of your body that is uncontrolled and unintentional. You may:  Have difficulty eating with a spoon or fork.  Have difficulty writing.  Nod your head up and down or side to side.  Have a quivering voice. The shaking may:  Get worse over time.  Come and go.  Be more noticeable on one side of your body.  Get worse due to stress, fatigue, caffeine, and extreme heat or cold. How is this diagnosed? This  condition may be diagnosed based on:  Your symptoms and medical history.  A physical exam. There is no single test to diagnose an essential tremor. However, your health care provider may order tests to rule out other causes of your condition. These may include:  Blood and urine tests.  Imaging studies of your brain, such as CT scan and MRI.  A test that measures involuntary muscle movement (electromyogram). How is this treated? Treatment for essential tremor depends on the severity of the condition.  Some tremors may go away without treatment.  Mild tremors may not need treatment if they do not affect your day-to-day life.  Severe tremors may need to be treated using one or more of the following options: ? Medicines. ? Lifestyle changes. ? Occupational or physical therapy. Follow these instructions at home: Lifestyle   Do not use any products that contain nicotine or tobacco, such as cigarettes and e-cigarettes. If you need help quitting, ask your health care provider.  Limit your caffeine intake as told by your health care provider.  Try to get 8 hours of sleep each night.  Find ways to manage your stress that fits your lifestyle and personality. Consider trying meditation or yoga.  Try to anticipate stressful situations and allow extra time to manage them.  If you are struggling emotionally with the effects of your tremor, consider working with a mental health provider. General instructions  Take over-the-counter and prescription medicines only as told by your health care provider.  Avoid extreme heat and extreme cold.  Keep all follow-up visits as told by your health care provider. This is important. Visits may include physical therapy visits. Contact a health care provider if:  You experience any changes in the location or intensity of your tremors.  You start having a tremor after starting a new medicine.  You have tremor with other symptoms, such  as: ? Numbness. ? Tingling. ? Pain. ? Weakness.  Your tremor gets worse.  Your tremor interferes with your daily life.  You feel down, blue, or sad for at least 2 weeks in a row.  Worrying about your tremor and what other people think about you interferes with your everyday life functions, including relationships, work, or school. Summary  Essential tremor is a tremor without a known cause. Usually, it occurs when you are trying to perform an action.  The cause of this condition is not known.  The main sign of a tremor is a rhythmic shaking of certain parts of your body that is uncontrolled and unintentional.  Treatment for essential tremor depends on the severity of the condition. This information is not intended to replace advice given to you by your health care provider. Make sure you discuss any questions you have with your health care provider. Document Revised: 11/20/2017 Document Reviewed: 11/20/2017 Elsevier Patient Education  2020 [employer].

## 2020-03-13 NOTE — Progress Notes (Signed)
Source: referral notes from [facility] [address] Deatra James.

## 2020-03-14 DIAGNOSIS — J301 Allergic rhinitis due to pollen: Secondary | ICD-10-CM | POA: Diagnosis not present

## 2020-03-14 DIAGNOSIS — J3081 Allergic rhinitis due to animal (cat) (dog) hair and dander: Secondary | ICD-10-CM | POA: Diagnosis not present

## 2020-03-14 DIAGNOSIS — J3089 Other allergic rhinitis: Secondary | ICD-10-CM | POA: Diagnosis not present

## 2020-03-16 DIAGNOSIS — J301 Allergic rhinitis due to pollen: Secondary | ICD-10-CM | POA: Diagnosis not present

## 2020-03-16 DIAGNOSIS — J3081 Allergic rhinitis due to animal (cat) (dog) hair and dander: Secondary | ICD-10-CM | POA: Diagnosis not present

## 2020-03-16 DIAGNOSIS — J3089 Other allergic rhinitis: Secondary | ICD-10-CM | POA: Diagnosis not present

## 2020-03-18 DIAGNOSIS — Z23 Encounter for immunization: Secondary | ICD-10-CM | POA: Diagnosis not present

## 2020-03-22 DIAGNOSIS — J3089 Other allergic rhinitis: Secondary | ICD-10-CM | POA: Diagnosis not present

## 2020-03-22 DIAGNOSIS — J3081 Allergic rhinitis due to animal (cat) (dog) hair and dander: Secondary | ICD-10-CM | POA: Diagnosis not present

## 2020-03-22 DIAGNOSIS — J301 Allergic rhinitis due to pollen: Secondary | ICD-10-CM | POA: Diagnosis not present

## 2020-03-27 DIAGNOSIS — J3089 Other allergic rhinitis: Secondary | ICD-10-CM | POA: Diagnosis not present

## 2020-03-27 DIAGNOSIS — J301 Allergic rhinitis due to pollen: Secondary | ICD-10-CM | POA: Diagnosis not present

## 2020-03-27 DIAGNOSIS — J3081 Allergic rhinitis due to animal (cat) (dog) hair and dander: Secondary | ICD-10-CM | POA: Diagnosis not present

## 2020-03-29 DIAGNOSIS — J301 Allergic rhinitis due to pollen: Secondary | ICD-10-CM | POA: Diagnosis not present

## 2020-03-29 DIAGNOSIS — J3089 Other allergic rhinitis: Secondary | ICD-10-CM | POA: Diagnosis not present

## 2020-03-29 DIAGNOSIS — J3081 Allergic rhinitis due to animal (cat) (dog) hair and dander: Secondary | ICD-10-CM | POA: Diagnosis not present

## 2020-04-03 DIAGNOSIS — J3081 Allergic rhinitis due to animal (cat) (dog) hair and dander: Secondary | ICD-10-CM | POA: Diagnosis not present

## 2020-04-03 DIAGNOSIS — J301 Allergic rhinitis due to pollen: Secondary | ICD-10-CM | POA: Diagnosis not present

## 2020-04-03 DIAGNOSIS — J3089 Other allergic rhinitis: Secondary | ICD-10-CM | POA: Diagnosis not present

## 2020-04-06 DIAGNOSIS — J3081 Allergic rhinitis due to animal (cat) (dog) hair and dander: Secondary | ICD-10-CM | POA: Diagnosis not present

## 2020-04-06 DIAGNOSIS — J3089 Other allergic rhinitis: Secondary | ICD-10-CM | POA: Diagnosis not present

## 2020-04-06 DIAGNOSIS — J301 Allergic rhinitis due to pollen: Secondary | ICD-10-CM | POA: Diagnosis not present

## 2020-04-10 DIAGNOSIS — J301 Allergic rhinitis due to pollen: Secondary | ICD-10-CM | POA: Diagnosis not present

## 2020-04-10 DIAGNOSIS — J3089 Other allergic rhinitis: Secondary | ICD-10-CM | POA: Diagnosis not present

## 2020-04-10 DIAGNOSIS — J3081 Allergic rhinitis due to animal (cat) (dog) hair and dander: Secondary | ICD-10-CM | POA: Diagnosis not present

## 2020-04-12 DIAGNOSIS — J3089 Other allergic rhinitis: Secondary | ICD-10-CM | POA: Diagnosis not present

## 2020-04-12 DIAGNOSIS — J301 Allergic rhinitis due to pollen: Secondary | ICD-10-CM | POA: Diagnosis not present

## 2020-04-12 DIAGNOSIS — J3081 Allergic rhinitis due to animal (cat) (dog) hair and dander: Secondary | ICD-10-CM | POA: Diagnosis not present

## 2020-04-15 DIAGNOSIS — Z23 Encounter for immunization: Secondary | ICD-10-CM | POA: Diagnosis not present

## 2020-04-20 DIAGNOSIS — J3081 Allergic rhinitis due to animal (cat) (dog) hair and dander: Secondary | ICD-10-CM | POA: Diagnosis not present

## 2020-04-20 DIAGNOSIS — J301 Allergic rhinitis due to pollen: Secondary | ICD-10-CM | POA: Diagnosis not present

## 2020-04-20 DIAGNOSIS — J3089 Other allergic rhinitis: Secondary | ICD-10-CM | POA: Diagnosis not present

## 2020-04-25 DIAGNOSIS — J3089 Other allergic rhinitis: Secondary | ICD-10-CM | POA: Diagnosis not present

## 2020-04-25 DIAGNOSIS — J3081 Allergic rhinitis due to animal (cat) (dog) hair and dander: Secondary | ICD-10-CM | POA: Diagnosis not present

## 2020-04-25 DIAGNOSIS — J301 Allergic rhinitis due to pollen: Secondary | ICD-10-CM | POA: Diagnosis not present

## 2020-04-27 DIAGNOSIS — J3089 Other allergic rhinitis: Secondary | ICD-10-CM | POA: Diagnosis not present

## 2020-05-01 DIAGNOSIS — J301 Allergic rhinitis due to pollen: Secondary | ICD-10-CM | POA: Diagnosis not present

## 2020-05-01 DIAGNOSIS — J3081 Allergic rhinitis due to animal (cat) (dog) hair and dander: Secondary | ICD-10-CM | POA: Diagnosis not present

## 2020-05-01 DIAGNOSIS — J3089 Other allergic rhinitis: Secondary | ICD-10-CM | POA: Diagnosis not present

## 2020-05-04 DIAGNOSIS — J301 Allergic rhinitis due to pollen: Secondary | ICD-10-CM | POA: Diagnosis not present

## 2020-05-04 DIAGNOSIS — J3089 Other allergic rhinitis: Secondary | ICD-10-CM | POA: Diagnosis not present

## 2020-05-04 DIAGNOSIS — J3081 Allergic rhinitis due to animal (cat) (dog) hair and dander: Secondary | ICD-10-CM | POA: Diagnosis not present

## 2020-05-08 DIAGNOSIS — J3089 Other allergic rhinitis: Secondary | ICD-10-CM | POA: Diagnosis not present

## 2020-05-08 DIAGNOSIS — J3081 Allergic rhinitis due to animal (cat) (dog) hair and dander: Secondary | ICD-10-CM | POA: Diagnosis not present

## 2020-05-08 DIAGNOSIS — J301 Allergic rhinitis due to pollen: Secondary | ICD-10-CM | POA: Diagnosis not present

## 2020-05-11 DIAGNOSIS — J301 Allergic rhinitis due to pollen: Secondary | ICD-10-CM | POA: Diagnosis not present

## 2020-05-11 DIAGNOSIS — J3081 Allergic rhinitis due to animal (cat) (dog) hair and dander: Secondary | ICD-10-CM | POA: Diagnosis not present

## 2020-05-11 DIAGNOSIS — J3089 Other allergic rhinitis: Secondary | ICD-10-CM | POA: Diagnosis not present

## 2020-05-15 DIAGNOSIS — J3089 Other allergic rhinitis: Secondary | ICD-10-CM | POA: Diagnosis not present

## 2020-05-15 DIAGNOSIS — J301 Allergic rhinitis due to pollen: Secondary | ICD-10-CM | POA: Diagnosis not present

## 2020-05-15 DIAGNOSIS — J3081 Allergic rhinitis due to animal (cat) (dog) hair and dander: Secondary | ICD-10-CM | POA: Diagnosis not present

## 2020-05-18 DIAGNOSIS — J3081 Allergic rhinitis due to animal (cat) (dog) hair and dander: Secondary | ICD-10-CM | POA: Diagnosis not present

## 2020-05-18 DIAGNOSIS — J301 Allergic rhinitis due to pollen: Secondary | ICD-10-CM | POA: Diagnosis not present

## 2020-05-18 DIAGNOSIS — J3089 Other allergic rhinitis: Secondary | ICD-10-CM | POA: Diagnosis not present

## 2020-05-23 DIAGNOSIS — J3081 Allergic rhinitis due to animal (cat) (dog) hair and dander: Secondary | ICD-10-CM | POA: Diagnosis not present

## 2020-05-23 DIAGNOSIS — J3089 Other allergic rhinitis: Secondary | ICD-10-CM | POA: Diagnosis not present

## 2020-05-23 DIAGNOSIS — J301 Allergic rhinitis due to pollen: Secondary | ICD-10-CM | POA: Diagnosis not present

## 2020-05-25 DIAGNOSIS — J301 Allergic rhinitis due to pollen: Secondary | ICD-10-CM | POA: Diagnosis not present

## 2020-05-25 DIAGNOSIS — J3081 Allergic rhinitis due to animal (cat) (dog) hair and dander: Secondary | ICD-10-CM | POA: Diagnosis not present

## 2020-05-25 DIAGNOSIS — J3089 Other allergic rhinitis: Secondary | ICD-10-CM | POA: Diagnosis not present

## 2020-05-29 DIAGNOSIS — J3081 Allergic rhinitis due to animal (cat) (dog) hair and dander: Secondary | ICD-10-CM | POA: Diagnosis not present

## 2020-05-29 DIAGNOSIS — J3089 Other allergic rhinitis: Secondary | ICD-10-CM | POA: Diagnosis not present

## 2020-05-29 DIAGNOSIS — J301 Allergic rhinitis due to pollen: Secondary | ICD-10-CM | POA: Diagnosis not present

## 2020-06-01 DIAGNOSIS — J3081 Allergic rhinitis due to animal (cat) (dog) hair and dander: Secondary | ICD-10-CM | POA: Diagnosis not present

## 2020-06-01 DIAGNOSIS — J301 Allergic rhinitis due to pollen: Secondary | ICD-10-CM | POA: Diagnosis not present

## 2020-06-01 DIAGNOSIS — J3089 Other allergic rhinitis: Secondary | ICD-10-CM | POA: Diagnosis not present

## 2020-06-04 ENCOUNTER — Other Ambulatory Visit: Payer: Self-pay | Admitting: Neurology

## 2020-06-04 DIAGNOSIS — J3089 Other allergic rhinitis: Secondary | ICD-10-CM | POA: Diagnosis not present

## 2020-06-04 DIAGNOSIS — J301 Allergic rhinitis due to pollen: Secondary | ICD-10-CM | POA: Diagnosis not present

## 2020-06-04 DIAGNOSIS — J3081 Allergic rhinitis due to animal (cat) (dog) hair and dander: Secondary | ICD-10-CM | POA: Diagnosis not present

## 2020-06-08 DIAGNOSIS — J3081 Allergic rhinitis due to animal (cat) (dog) hair and dander: Secondary | ICD-10-CM | POA: Diagnosis not present

## 2020-06-08 DIAGNOSIS — J301 Allergic rhinitis due to pollen: Secondary | ICD-10-CM | POA: Diagnosis not present

## 2020-06-11 DIAGNOSIS — J301 Allergic rhinitis due to pollen: Secondary | ICD-10-CM | POA: Diagnosis not present

## 2020-06-11 DIAGNOSIS — J3089 Other allergic rhinitis: Secondary | ICD-10-CM | POA: Diagnosis not present

## 2020-06-11 DIAGNOSIS — J3081 Allergic rhinitis due to animal (cat) (dog) hair and dander: Secondary | ICD-10-CM | POA: Diagnosis not present

## 2020-06-20 DIAGNOSIS — Z1211 Encounter for screening for malignant neoplasm of colon: Secondary | ICD-10-CM | POA: Diagnosis not present

## 2020-06-20 DIAGNOSIS — Z0000 Encounter for general adult medical examination without abnormal findings: Secondary | ICD-10-CM | POA: Diagnosis not present

## 2020-06-20 DIAGNOSIS — Z13220 Encounter for screening for lipoid disorders: Secondary | ICD-10-CM | POA: Diagnosis not present

## 2020-06-22 DIAGNOSIS — J3089 Other allergic rhinitis: Secondary | ICD-10-CM | POA: Diagnosis not present

## 2020-06-22 DIAGNOSIS — J301 Allergic rhinitis due to pollen: Secondary | ICD-10-CM | POA: Diagnosis not present

## 2020-06-22 DIAGNOSIS — J3081 Allergic rhinitis due to animal (cat) (dog) hair and dander: Secondary | ICD-10-CM | POA: Diagnosis not present

## 2020-06-28 DIAGNOSIS — J301 Allergic rhinitis due to pollen: Secondary | ICD-10-CM | POA: Diagnosis not present

## 2020-06-28 DIAGNOSIS — J3081 Allergic rhinitis due to animal (cat) (dog) hair and dander: Secondary | ICD-10-CM | POA: Diagnosis not present

## 2020-06-28 DIAGNOSIS — J3089 Other allergic rhinitis: Secondary | ICD-10-CM | POA: Diagnosis not present

## 2020-07-06 DIAGNOSIS — J301 Allergic rhinitis due to pollen: Secondary | ICD-10-CM | POA: Diagnosis not present

## 2020-07-06 DIAGNOSIS — J3089 Other allergic rhinitis: Secondary | ICD-10-CM | POA: Diagnosis not present

## 2020-07-06 DIAGNOSIS — J3081 Allergic rhinitis due to animal (cat) (dog) hair and dander: Secondary | ICD-10-CM | POA: Diagnosis not present

## 2020-07-10 DIAGNOSIS — J3089 Other allergic rhinitis: Secondary | ICD-10-CM | POA: Diagnosis not present

## 2020-07-13 DIAGNOSIS — J3089 Other allergic rhinitis: Secondary | ICD-10-CM | POA: Diagnosis not present

## 2020-07-13 DIAGNOSIS — J3081 Allergic rhinitis due to animal (cat) (dog) hair and dander: Secondary | ICD-10-CM | POA: Diagnosis not present

## 2020-07-13 DIAGNOSIS — J301 Allergic rhinitis due to pollen: Secondary | ICD-10-CM | POA: Diagnosis not present

## 2020-07-20 DIAGNOSIS — J301 Allergic rhinitis due to pollen: Secondary | ICD-10-CM | POA: Diagnosis not present

## 2020-07-20 DIAGNOSIS — J3081 Allergic rhinitis due to animal (cat) (dog) hair and dander: Secondary | ICD-10-CM | POA: Diagnosis not present

## 2020-07-20 DIAGNOSIS — J3089 Other allergic rhinitis: Secondary | ICD-10-CM | POA: Diagnosis not present

## 2020-07-21 ENCOUNTER — Other Ambulatory Visit: Payer: Self-pay | Admitting: Obstetrics and Gynecology

## 2020-07-23 NOTE — Telephone Encounter (Signed)
Medication refill request: Janyce Llanos  Last AEX:  08-03-2019 BS Next AEX: 09-19-20  Last MMG (if hormonal medication request): 09-16-2019 density B/BIRADS 1 negative Refill authorized: Today, please advise.   Medication pended for #28, 1RF. Please refill if appropriate.

## 2020-07-27 DIAGNOSIS — J301 Allergic rhinitis due to pollen: Secondary | ICD-10-CM | POA: Diagnosis not present

## 2020-07-27 DIAGNOSIS — J3089 Other allergic rhinitis: Secondary | ICD-10-CM | POA: Diagnosis not present

## 2020-07-27 DIAGNOSIS — J3081 Allergic rhinitis due to animal (cat) (dog) hair and dander: Secondary | ICD-10-CM | POA: Diagnosis not present

## 2020-08-02 DIAGNOSIS — J3089 Other allergic rhinitis: Secondary | ICD-10-CM | POA: Diagnosis not present

## 2020-08-02 DIAGNOSIS — J301 Allergic rhinitis due to pollen: Secondary | ICD-10-CM | POA: Diagnosis not present

## 2020-08-02 DIAGNOSIS — J3081 Allergic rhinitis due to animal (cat) (dog) hair and dander: Secondary | ICD-10-CM | POA: Diagnosis not present

## 2020-08-08 DIAGNOSIS — J3081 Allergic rhinitis due to animal (cat) (dog) hair and dander: Secondary | ICD-10-CM | POA: Diagnosis not present

## 2020-08-08 DIAGNOSIS — J3089 Other allergic rhinitis: Secondary | ICD-10-CM | POA: Diagnosis not present

## 2020-08-08 DIAGNOSIS — J301 Allergic rhinitis due to pollen: Secondary | ICD-10-CM | POA: Diagnosis not present

## 2020-08-10 DIAGNOSIS — J301 Allergic rhinitis due to pollen: Secondary | ICD-10-CM | POA: Diagnosis not present

## 2020-08-10 DIAGNOSIS — J3081 Allergic rhinitis due to animal (cat) (dog) hair and dander: Secondary | ICD-10-CM | POA: Diagnosis not present

## 2020-08-10 DIAGNOSIS — J3089 Other allergic rhinitis: Secondary | ICD-10-CM | POA: Diagnosis not present

## 2020-08-14 DIAGNOSIS — J301 Allergic rhinitis due to pollen: Secondary | ICD-10-CM | POA: Diagnosis not present

## 2020-08-14 DIAGNOSIS — J3089 Other allergic rhinitis: Secondary | ICD-10-CM | POA: Diagnosis not present

## 2020-08-14 DIAGNOSIS — J3081 Allergic rhinitis due to animal (cat) (dog) hair and dander: Secondary | ICD-10-CM | POA: Diagnosis not present

## 2020-08-16 ENCOUNTER — Other Ambulatory Visit: Payer: Self-pay | Admitting: Obstetrics and Gynecology

## 2020-08-16 DIAGNOSIS — J3089 Other allergic rhinitis: Secondary | ICD-10-CM | POA: Diagnosis not present

## 2020-08-16 DIAGNOSIS — J301 Allergic rhinitis due to pollen: Secondary | ICD-10-CM | POA: Diagnosis not present

## 2020-08-16 DIAGNOSIS — J3081 Allergic rhinitis due to animal (cat) (dog) hair and dander: Secondary | ICD-10-CM | POA: Diagnosis not present

## 2020-08-22 DIAGNOSIS — J3089 Other allergic rhinitis: Secondary | ICD-10-CM | POA: Diagnosis not present

## 2020-08-22 DIAGNOSIS — J301 Allergic rhinitis due to pollen: Secondary | ICD-10-CM | POA: Diagnosis not present

## 2020-08-22 DIAGNOSIS — J3081 Allergic rhinitis due to animal (cat) (dog) hair and dander: Secondary | ICD-10-CM | POA: Diagnosis not present

## 2020-08-27 DIAGNOSIS — J301 Allergic rhinitis due to pollen: Secondary | ICD-10-CM | POA: Diagnosis not present

## 2020-08-27 DIAGNOSIS — J3089 Other allergic rhinitis: Secondary | ICD-10-CM | POA: Diagnosis not present

## 2020-08-27 DIAGNOSIS — J3081 Allergic rhinitis due to animal (cat) (dog) hair and dander: Secondary | ICD-10-CM | POA: Diagnosis not present

## 2020-09-05 DIAGNOSIS — J3081 Allergic rhinitis due to animal (cat) (dog) hair and dander: Secondary | ICD-10-CM | POA: Diagnosis not present

## 2020-09-05 DIAGNOSIS — J3089 Other allergic rhinitis: Secondary | ICD-10-CM | POA: Diagnosis not present

## 2020-09-05 DIAGNOSIS — J301 Allergic rhinitis due to pollen: Secondary | ICD-10-CM | POA: Diagnosis not present

## 2020-09-13 ENCOUNTER — Other Ambulatory Visit: Payer: Self-pay | Admitting: Obstetrics and Gynecology

## 2020-09-13 NOTE — Telephone Encounter (Signed)
Medication refill request: Sarah Fields  Last AEX:  08-03-2019 BS Next AEX: 09-19-20  Last MMG (if hormonal medication request): 09-16-2019 density B/BIRADS 1 negative  Refill authorized: Today, please advise.   Medication pended for #28, 0RF. Please refill if appropriate.

## 2020-09-14 DIAGNOSIS — J3081 Allergic rhinitis due to animal (cat) (dog) hair and dander: Secondary | ICD-10-CM | POA: Diagnosis not present

## 2020-09-14 DIAGNOSIS — J301 Allergic rhinitis due to pollen: Secondary | ICD-10-CM | POA: Diagnosis not present

## 2020-09-14 DIAGNOSIS — J3089 Other allergic rhinitis: Secondary | ICD-10-CM | POA: Diagnosis not present

## 2020-09-17 NOTE — Progress Notes (Signed)
44 y.o. G52P2002 Divorced Caucasian female here for annual exam.    Noticing a vaginal lump that comes and goes. It is more present during her menses and is painful.  Normal bladder and bowel function.   Menses almost every month. She is taking progesterone only pills. No breakthrough bleeding.   Saw neurologist for tremors.  She developed an allergy to propranolol.  Working from home. Received a promotion.   Received her Covid vaccine.  Moderna.  No flu vaccine yet.   PCP:  Deatra James, MD  Patient's last menstrual period was 08/18/2020 (exact date).           Sexually active: No.  The current method of family planning is tubal ligation.    Exercising: No.  The patient does not participate in regular exercise at present. Smoker:  no  Health Maintenance: Pap: 07/02/17 Neg:Neg HR HPV, 05-18-14 Neg:Neg HR HPV History of abnormal Pap:  Yes, 2000 hx of colposcopy but no treatment to cervix. MMG:  09-19-19 3D/Neg/density B/Birads1--pt. Knows to schedule Colonoscopy:  n/a BMD:   n/a  Result  n/a TDaP: PCP Gardasil:   no HIV: 01-19-20 NR Hep C: never Screening Labs:  PCP.    reports that she has never smoked. She has never used smokeless tobacco. She reports that she does not drink alcohol and does not use drugs.  Past Medical History:  Diagnosis Date  . Allergy   . Anxiety   . Asthma   . Depression   . Essential tremor   . Gestational diabetes    h/o  . Migraines    with aura  . Tenosynovitis, de Matthias Hughs     Past Surgical History:  Procedure Laterality Date  . CESAREAN SECTION     2001, 2003  . KNEE SURGERY Bilateral    arthroscopic  . TUBAL LIGATION    . WISDOM TOOTH EXTRACTION      Current Outpatient Medications  Medication Sig Dispense Refill  . acetaminophen (TYLENOL) 500 MG tablet Take 1,000 mg by mouth every 6 (six) hours as needed for mild pain.    Marland Kitchen albuterol (PROVENTIL HFA;VENTOLIN HFA) 108 (90 BASE) MCG/ACT inhaler Inhale 2 puffs into the lungs  every 6 (six) hours as needed for wheezing or shortness of breath.     . Azelastine-Fluticasone 137-50 MCG/ACT SUSP Place 1 spray into both nostrils 2 (two) times daily.    Marland Kitchen EPINEPHrine 0.3 mg/0.3 mL IJ SOAJ injection INJECT IN OUTER THIGH AS NEEDED FOR ANAPHYLAXIS    . guaiFENesin (MUCINEX) 600 MG 12 hr tablet Take 600 mg by mouth 2 (two) times daily as needed for cough.    . INCASSIA 0.35 MG tablet TAKE 1 TABLET BY MOUTH EVERY DAY 28 tablet 0  . levocetirizine (XYZAL) 5 MG tablet SMARTSIG:1 Tablet(s) By Mouth Every Evening    . montelukast (SINGULAIR) 10 MG tablet Take 10 mg by mouth at bedtime.     Marland Kitchen PRESCRIPTION MEDICATION 2 (two) times a week. Allergy Shots    . primidone (MYSOLINE) 50 MG tablet TAKE 1 TABLET BY MOUTH EVERYDAY AT BEDTIME 90 tablet 1  . rizatriptan (MAXALT) 10 MG tablet Take 10 mg by mouth as needed for migraine. May repeat in 2 hours if needed     . sodium chloride (OCEAN) 0.65 % SOLN nasal spray Place 1 spray into both nostrils as needed for congestion.  0  . SYMBICORT 160-4.5 MCG/ACT inhaler     . UNABLE TO FIND Med Name: Juice Plus Garden Blend  2 capsules daily    . UNABLE TO FIND Med Name:  Juice Plus Orchard Blend 2 capsules daily    . UNABLE TO FIND Med Name:  Juice Plus Omega Blend 2 capsules daily    . Vitamin D, Ergocalciferol, (DRISDOL) 50000 units CAPS capsule Take 50,000 Units by mouth every 7 (seven) days. wednesday  1   No current facility-administered medications for this visit.    Family History  Problem Relation Age of Onset  . Hyperlipidemia Mother   . Migraines Mother   . Thyroid disease Mother   . Glaucoma Mother   . Osteoporosis Mother   . Seizures Father   . Hypertension Father   . Cancer Father 79       Prostate Ca  . Glaucoma Father   . Stroke Maternal Grandmother   . Cancer Maternal Grandfather   . Migraines Maternal Grandfather   . Stroke Paternal Grandfather     Review of Systems  All other systems reviewed and are  negative.   Exam:   BP 112/78   Pulse 94   Ht 5' 4.5" (1.638 m)   Wt 300 lb (136.1 kg)   LMP 08/18/2020 (Exact Date)   SpO2 98%   BMI 50.70 kg/m     General appearance: alert, cooperative and appears stated age Head: normocephalic, without obvious abnormality, atraumatic Neck: no adenopathy, supple, symmetrical, trachea midline and thyroid normal to inspection and palpation Lungs: clear to auscultation bilaterally Breasts: normal appearance, no masses or tenderness, No nipple retraction or dimpling, No nipple discharge or bleeding, No axillary adenopathy Heart: regular rate and rhythm Abdomen: soft, non-tender; no masses, no organomegaly Extremities: extremities normal, atraumatic, no cyanosis or edema Skin: skin color, texture, turgor normal. No rashes or lesions Lymph nodes: cervical, supraclavicular, and axillary nodes normal. Neurologic: grossly normal  Pelvic: External genitalia:  no lesions              No abnormal inguinal nodes palpated.              Urethra:  normal appearing urethra with no masses, tenderness or lesions              Bartholins and Skenes: normal                 Vagina: normal appearing vagina with normal color and discharge, no lesions              Cervix: no lesions              Pap taken: No. Bimanual Exam:  Uterus:  normal size, contour, position, consistency, mobility, non-tender              Adnexa: no mass, fullness, tenderness              Rectal exam: Yes.  .  Confirms.              Anus:  normal sphincter tone, no lesions  Chaperone was present for exam.  Assessment:   Well woman visit with normal exam. Status post BTL.  On Micronor for menorrhagia. Hx cryotherapy.  Migraines with aura.  HA with exercise.  Obesity.  Plan: Mammogram screening discussed. Self breast awareness reviewed. Pap and HR HPV as above. Guidelines for Calcium, Vitamin D, regular exercise program including cardiovascular and weight bearing exercise. Continue  Progesterone only pills.  Referral to weight management.  Follow up annually and prn.    After visit summary provided.

## 2020-09-19 ENCOUNTER — Other Ambulatory Visit: Payer: Self-pay

## 2020-09-19 ENCOUNTER — Encounter: Payer: Self-pay | Admitting: Obstetrics and Gynecology

## 2020-09-19 ENCOUNTER — Ambulatory Visit: Payer: BC Managed Care – PPO | Admitting: Obstetrics and Gynecology

## 2020-09-19 VITALS — BP 112/78 | HR 94 | Ht 64.5 in | Wt 300.0 lb

## 2020-09-19 DIAGNOSIS — Z01419 Encounter for gynecological examination (general) (routine) without abnormal findings: Secondary | ICD-10-CM

## 2020-09-19 DIAGNOSIS — Z6841 Body Mass Index (BMI) 40.0 and over, adult: Secondary | ICD-10-CM | POA: Diagnosis not present

## 2020-09-19 MED ORDER — NORETHINDRONE 0.35 MG PO TABS
1.0000 | ORAL_TABLET | Freq: Every day | ORAL | 3 refills | Status: DC
Start: 2020-09-19 — End: 2021-09-23

## 2020-09-19 NOTE — Patient Instructions (Signed)

## 2020-09-21 DIAGNOSIS — J3089 Other allergic rhinitis: Secondary | ICD-10-CM | POA: Diagnosis not present

## 2020-09-21 DIAGNOSIS — J3081 Allergic rhinitis due to animal (cat) (dog) hair and dander: Secondary | ICD-10-CM | POA: Diagnosis not present

## 2020-09-21 DIAGNOSIS — J301 Allergic rhinitis due to pollen: Secondary | ICD-10-CM | POA: Diagnosis not present

## 2020-09-28 DIAGNOSIS — J3089 Other allergic rhinitis: Secondary | ICD-10-CM | POA: Diagnosis not present

## 2020-09-28 DIAGNOSIS — J301 Allergic rhinitis due to pollen: Secondary | ICD-10-CM | POA: Diagnosis not present

## 2020-09-28 DIAGNOSIS — J3081 Allergic rhinitis due to animal (cat) (dog) hair and dander: Secondary | ICD-10-CM | POA: Diagnosis not present

## 2020-10-01 ENCOUNTER — Encounter

## 2020-10-04 DIAGNOSIS — J3089 Other allergic rhinitis: Secondary | ICD-10-CM | POA: Diagnosis not present

## 2020-10-04 DIAGNOSIS — J3081 Allergic rhinitis due to animal (cat) (dog) hair and dander: Secondary | ICD-10-CM | POA: Diagnosis not present

## 2020-10-04 DIAGNOSIS — J301 Allergic rhinitis due to pollen: Secondary | ICD-10-CM | POA: Diagnosis not present

## 2020-10-11 DIAGNOSIS — J3089 Other allergic rhinitis: Secondary | ICD-10-CM | POA: Diagnosis not present

## 2020-10-11 DIAGNOSIS — J301 Allergic rhinitis due to pollen: Secondary | ICD-10-CM | POA: Diagnosis not present

## 2020-10-11 DIAGNOSIS — J3081 Allergic rhinitis due to animal (cat) (dog) hair and dander: Secondary | ICD-10-CM | POA: Diagnosis not present

## 2020-10-15 DIAGNOSIS — J4540 Moderate persistent asthma, uncomplicated: Secondary | ICD-10-CM | POA: Diagnosis not present

## 2020-10-15 DIAGNOSIS — J3081 Allergic rhinitis due to animal (cat) (dog) hair and dander: Secondary | ICD-10-CM | POA: Diagnosis not present

## 2020-10-15 DIAGNOSIS — J301 Allergic rhinitis due to pollen: Secondary | ICD-10-CM | POA: Diagnosis not present

## 2020-10-15 DIAGNOSIS — J3089 Other allergic rhinitis: Secondary | ICD-10-CM | POA: Diagnosis not present

## 2020-10-19 DIAGNOSIS — S99921A Unspecified injury of right foot, initial encounter: Secondary | ICD-10-CM | POA: Diagnosis not present

## 2020-10-20 DIAGNOSIS — M79671 Pain in right foot: Secondary | ICD-10-CM | POA: Diagnosis not present

## 2020-10-26 DIAGNOSIS — J301 Allergic rhinitis due to pollen: Secondary | ICD-10-CM | POA: Diagnosis not present

## 2020-10-26 DIAGNOSIS — J3081 Allergic rhinitis due to animal (cat) (dog) hair and dander: Secondary | ICD-10-CM | POA: Diagnosis not present

## 2020-10-26 DIAGNOSIS — J3089 Other allergic rhinitis: Secondary | ICD-10-CM | POA: Diagnosis not present

## 2020-11-02 DIAGNOSIS — J3089 Other allergic rhinitis: Secondary | ICD-10-CM | POA: Diagnosis not present

## 2020-11-02 DIAGNOSIS — J3081 Allergic rhinitis due to animal (cat) (dog) hair and dander: Secondary | ICD-10-CM | POA: Diagnosis not present

## 2020-11-02 DIAGNOSIS — J301 Allergic rhinitis due to pollen: Secondary | ICD-10-CM | POA: Diagnosis not present

## 2020-11-02 DIAGNOSIS — M79671 Pain in right foot: Secondary | ICD-10-CM | POA: Diagnosis not present

## 2020-11-09 DIAGNOSIS — J3089 Other allergic rhinitis: Secondary | ICD-10-CM | POA: Diagnosis not present

## 2020-11-09 DIAGNOSIS — J3081 Allergic rhinitis due to animal (cat) (dog) hair and dander: Secondary | ICD-10-CM | POA: Diagnosis not present

## 2020-11-09 DIAGNOSIS — J301 Allergic rhinitis due to pollen: Secondary | ICD-10-CM | POA: Diagnosis not present

## 2020-11-13 DIAGNOSIS — J3081 Allergic rhinitis due to animal (cat) (dog) hair and dander: Secondary | ICD-10-CM | POA: Diagnosis not present

## 2020-11-13 DIAGNOSIS — J3089 Other allergic rhinitis: Secondary | ICD-10-CM | POA: Diagnosis not present

## 2020-11-13 DIAGNOSIS — J301 Allergic rhinitis due to pollen: Secondary | ICD-10-CM | POA: Diagnosis not present

## 2020-11-21 DIAGNOSIS — J3081 Allergic rhinitis due to animal (cat) (dog) hair and dander: Secondary | ICD-10-CM | POA: Diagnosis not present

## 2020-11-21 DIAGNOSIS — J3089 Other allergic rhinitis: Secondary | ICD-10-CM | POA: Diagnosis not present

## 2020-11-21 DIAGNOSIS — J301 Allergic rhinitis due to pollen: Secondary | ICD-10-CM | POA: Diagnosis not present

## 2020-11-26 DIAGNOSIS — M79671 Pain in right foot: Secondary | ICD-10-CM | POA: Diagnosis not present

## 2020-11-27 DIAGNOSIS — L814 Other melanin hyperpigmentation: Secondary | ICD-10-CM | POA: Diagnosis not present

## 2020-11-27 DIAGNOSIS — L578 Other skin changes due to chronic exposure to nonionizing radiation: Secondary | ICD-10-CM | POA: Diagnosis not present

## 2020-11-27 DIAGNOSIS — D225 Melanocytic nevi of trunk: Secondary | ICD-10-CM | POA: Diagnosis not present

## 2020-11-27 DIAGNOSIS — D2372 Other benign neoplasm of skin of left lower limb, including hip: Secondary | ICD-10-CM | POA: Diagnosis not present

## 2020-11-29 DIAGNOSIS — J3081 Allergic rhinitis due to animal (cat) (dog) hair and dander: Secondary | ICD-10-CM | POA: Diagnosis not present

## 2020-11-29 DIAGNOSIS — J301 Allergic rhinitis due to pollen: Secondary | ICD-10-CM | POA: Diagnosis not present

## 2020-11-29 DIAGNOSIS — J3089 Other allergic rhinitis: Secondary | ICD-10-CM | POA: Diagnosis not present

## 2020-12-06 ENCOUNTER — Inpatient Hospital Stay: Payer: BLUE CROSS/BLUE SHIELD | Attending: Family Medicine | Primary: Family Medicine

## 2020-12-06 ENCOUNTER — Encounter

## 2020-12-06 ENCOUNTER — Inpatient Hospital Stay: Admit: 2020-12-06 | Payer: BLUE CROSS/BLUE SHIELD | Attending: Family Medicine | Primary: Family Medicine

## 2020-12-06 DIAGNOSIS — Z1239 Encounter for other screening for malignant neoplasm of breast: Secondary | ICD-10-CM

## 2020-12-07 DIAGNOSIS — J3081 Allergic rhinitis due to animal (cat) (dog) hair and dander: Secondary | ICD-10-CM | POA: Diagnosis not present

## 2020-12-07 DIAGNOSIS — J301 Allergic rhinitis due to pollen: Secondary | ICD-10-CM | POA: Diagnosis not present

## 2020-12-07 DIAGNOSIS — J3089 Other allergic rhinitis: Secondary | ICD-10-CM | POA: Diagnosis not present

## 2020-12-12 DIAGNOSIS — J3081 Allergic rhinitis due to animal (cat) (dog) hair and dander: Secondary | ICD-10-CM | POA: Diagnosis not present

## 2020-12-12 DIAGNOSIS — J301 Allergic rhinitis due to pollen: Secondary | ICD-10-CM | POA: Diagnosis not present

## 2020-12-13 ENCOUNTER — Other Ambulatory Visit: Payer: Self-pay

## 2020-12-13 ENCOUNTER — Ambulatory Visit (INDEPENDENT_AMBULATORY_CARE_PROVIDER_SITE_OTHER): Payer: BC Managed Care – PPO | Admitting: Family Medicine

## 2020-12-13 ENCOUNTER — Encounter (INDEPENDENT_AMBULATORY_CARE_PROVIDER_SITE_OTHER): Payer: Self-pay | Admitting: Family Medicine

## 2020-12-13 VITALS — BP 133/90 | HR 85 | Temp 98.3°F | Ht 64.0 in | Wt 295.0 lb

## 2020-12-13 DIAGNOSIS — Z1331 Encounter for screening for depression: Secondary | ICD-10-CM | POA: Diagnosis not present

## 2020-12-13 DIAGNOSIS — Z9189 Other specified personal risk factors, not elsewhere classified: Secondary | ICD-10-CM

## 2020-12-13 DIAGNOSIS — R5383 Other fatigue: Secondary | ICD-10-CM

## 2020-12-13 DIAGNOSIS — R739 Hyperglycemia, unspecified: Secondary | ICD-10-CM | POA: Diagnosis not present

## 2020-12-13 DIAGNOSIS — R0602 Shortness of breath: Secondary | ICD-10-CM

## 2020-12-13 DIAGNOSIS — E559 Vitamin D deficiency, unspecified: Secondary | ICD-10-CM

## 2020-12-13 DIAGNOSIS — J3081 Allergic rhinitis due to animal (cat) (dog) hair and dander: Secondary | ICD-10-CM | POA: Diagnosis not present

## 2020-12-13 DIAGNOSIS — J301 Allergic rhinitis due to pollen: Secondary | ICD-10-CM | POA: Diagnosis not present

## 2020-12-13 DIAGNOSIS — Z0289 Encounter for other administrative examinations: Secondary | ICD-10-CM

## 2020-12-13 DIAGNOSIS — R6889 Other general symptoms and signs: Secondary | ICD-10-CM | POA: Diagnosis not present

## 2020-12-13 DIAGNOSIS — J3089 Other allergic rhinitis: Secondary | ICD-10-CM | POA: Diagnosis not present

## 2020-12-13 DIAGNOSIS — Z6841 Body Mass Index (BMI) 40.0 and over, adult: Secondary | ICD-10-CM

## 2020-12-14 LAB — COMPREHENSIVE METABOLIC PANEL
ALT: 13 IU/L (ref 0–32)
AST: 16 IU/L (ref 0–40)
Albumin/Globulin Ratio: 1.8 (ref 1.2–2.2)
Albumin: 4.3 g/dL (ref 3.8–4.8)
Alkaline Phosphatase: 97 IU/L (ref 44–121)
BUN/Creatinine Ratio: 12 (ref 9–23)
BUN: 12 mg/dL (ref 6–24)
Bilirubin Total: 0.3 mg/dL (ref 0.0–1.2)
CO2: 26 mmol/L (ref 20–29)
Calcium: 9.4 mg/dL (ref 8.7–10.2)
Chloride: 101 mmol/L (ref 96–106)
Creatinine, Ser: 0.98 mg/dL (ref 0.57–1.00)
GFR calc Af Amer: 80 mL/min/{1.73_m2} (ref >59)
GFR calc non Af Amer: 69 mL/min/{1.73_m2} (ref >59)
Globulin, Total: 2.4 g/dL (ref 1.5–4.5)
Glucose: 83 mg/dL (ref 65–99)
Potassium: 4.9 mmol/L (ref 3.5–5.2)
Sodium: 137 mmol/L (ref 134–144)
Total Protein: 6.7 g/dL (ref 6.0–8.5)

## 2020-12-14 LAB — CBC WITH DIFFERENTIAL/PLATELET
Basophils Absolute: 0.1 10*3/uL (ref 0.0–0.2)
Basos: 1 %
EOS (ABSOLUTE): 0.2 10*3/uL (ref 0.0–0.4)
Eos: 2 %
Hematocrit: 42.1 % (ref 34.0–46.6)
Hemoglobin: 13.6 g/dL (ref 11.1–15.9)
Immature Grans (Abs): 0.2 10*3/uL — ABNORMAL HIGH (ref 0.0–0.1)
Immature Granulocytes: 2 %
Lymphocytes Absolute: 2.3 10*3/uL (ref 0.7–3.1)
Lymphs: 22 %
MCH: 29.8 pg (ref 26.6–33.0)
MCHC: 32.3 g/dL (ref 31.5–35.7)
MCV: 92 fL (ref 79–97)
Monocytes Absolute: 0.7 10*3/uL (ref 0.1–0.9)
Monocytes: 6 %
Neutrophils Absolute: 7.4 10*3/uL — ABNORMAL HIGH (ref 1.4–7.0)
Neutrophils: 67 %
Platelets: 331 10*3/uL (ref 150–450)
RBC: 4.56 x10E6/uL (ref 3.77–5.28)
RDW: 12.4 % (ref 11.7–15.4)
WBC: 10.8 10*3/uL (ref 3.4–10.8)

## 2020-12-14 LAB — LIPID PANEL WITH LDL/HDL RATIO
Cholesterol, Total: 184 mg/dL (ref 100–199)
HDL: 37 mg/dL — ABNORMAL LOW (ref >39)
LDL Chol Calc (NIH): 110 mg/dL — ABNORMAL HIGH (ref 0–99)
LDL/HDL Ratio: 3 ratio (ref 0.0–3.2)
Triglycerides: 214 mg/dL — ABNORMAL HIGH (ref 0–149)
VLDL Cholesterol Cal: 37 mg/dL (ref 5–40)

## 2020-12-14 LAB — T3: T3, Total: 126 ng/dL (ref 71–180)

## 2020-12-14 LAB — HEMOGLOBIN A1C
Est. average glucose Bld gHb Est-mCnc: 108 mg/dL
Hgb A1c MFr Bld: 5.4 % (ref 4.8–5.6)

## 2020-12-14 LAB — TSH: TSH: 4.2 u[IU]/mL (ref 0.450–4.500)

## 2020-12-14 LAB — VITAMIN B12: Vitamin B-12: 292 pg/mL (ref 232–1245)

## 2020-12-14 LAB — T4: T4, Total: 6.7 ug/dL (ref 4.5–12.0)

## 2020-12-14 LAB — VITAMIN D 25 HYDROXY (VIT D DEFICIENCY, FRACTURES): Vit D, 25-Hydroxy: 28.4 ng/mL — ABNORMAL LOW (ref 30.0–100.0)

## 2020-12-14 LAB — INSULIN, RANDOM: INSULIN: 21.4 u[IU]/mL (ref 2.6–24.9)

## 2020-12-14 LAB — FOLATE: Folate: 10.9 ng/mL (ref >3.0)

## 2020-12-18 NOTE — Progress Notes (Signed)
Chief Complaint:   OBESITY Sarah Fields (MR# [id number]) is a 45 y.o. female who presents for evaluation and treatment of obesity and related comorbidities. Current BMI is Body mass index is 50.64 kg/m. Lonita has been struggling with her weight for many years and has been unsuccessful in either losing weight, maintaining weight loss, or reaching her healthy weight goal.  Elica is currently in the action stage of change and ready to dedicate time achieving and maintaining a healthier weight. Ajiah is interested in becoming our patient and working on intensive lifestyle modifications including (but not limited to) diet and exercise for weight loss.  Daneshia's habits were reviewed today and are as follows: she thinks her family will eat healthier with her, she struggles with family and or coworkers weight loss sabotage, her desired weight loss is 95 lbs, she has been heavy most of her life, she started gaining weight slowly over the years, her heaviest weight ever was 295 pounds, she is a picky eater and doesn't like to eat healthier foods, she has significant food cravings issues, she snacks frequently in the evenings, she skips meals frequently, she is frequently drinking liquids with calories, she frequently makes poor food choices, she has problems with excessive hunger, she frequently eats larger portions than normal and she struggles with emotional eating.  Depression Screen Jenell's Food and Mood (modified PHQ-9) score was 13.  Depression screen [facility] 2/9 12/13/2020  Decreased Interest 2  Down, Depressed, Hopeless 1  PHQ - 2 Score 3  Altered sleeping 1  Tired, decreased energy 3  Change in appetite 2  Feeling bad or failure about yourself  1  Trouble concentrating 1  Moving slowly or fidgety/restless 2  Suicidal thoughts 0  PHQ-9 Score 13   Subjective:   1. Other fatigue Lileigh admits to daytime somnolence and admits to waking up still tired. Patent  has a history of symptoms of daytime fatigue and morning headache. Shauntia generally gets 7 or 8 hours of sleep per night, and states that she has generally restful sleep. Snoring is present. Apneic episodes are not present. Epworth Sleepiness Score is 6.  2. Shortness of breath on exertion Victorino Dike notes increasing shortness of breath with a history of asthma and allergies. She denies a history of intubation. She is followed by Allergy. She notes getting out of breath sooner with activity than she used to. This has not gotten worse recently. Chany denies shortness of breath at rest or orthopnea.  3. Vitamin D deficiency Roan is on Vit D, and she notes fatigue. She is due for labs.  4. Hyperglycemia Jayln has a history of some elevated glucose readings. She may not be fasting, and she has no recent evaluations.  5. At risk for activity intolerance Meral is at risk of exercise intolerance due to Asthma.  Assessment/Plan:   1. Other fatigue Ameshia does feel that her weight is causing her energy to be lower than it should be. Fatigue may be related to obesity, depression or many other causes. Labs will be ordered, and in the meanwhile, Anahid will focus on self care including making healthy food choices, increasing physical activity and focusing on stress reduction.  - Vitamin B12 - EKG 12-Lead - Folate - T3 - T4 - TSH  2. Shortness of breath on exertion Lexii does feel that she gets out of breath more easily that she used to when she exercises. Samai's shortness of breath appears to be obesity related and exercise  induced. She has agreed to work on weight loss and gradually increase exercise to treat her exercise induced shortness of breath. Will continue to monitor closely.  - CBC with Differential/Platelet - Lipid Panel With LDL/HDL Ratio  3. Vitamin D deficiency Low Vitamin D level contributes to fatigue and are associated with obesity, breast, and colon  cancer. We will check labs today. Mirakle will follow-up for routine testing of Vitamin D, at least 2-3 times per year to avoid over-replacement.  - VITAMIN D 25 Hydroxy (Vit-D Deficiency, Fractures)  4. Hyperglycemia Fasting labs will be obtained today, and results with be discussed with Victorino Dike in 2 weeks at her follow up visit. In the meanwhile Andreal was started on a lower simple carbohydrate diet and will work on weight loss efforts.  - Comprehensive metabolic panel - Hemoglobin A1c - Insulin, random  5. Screening for depression Malaney had a positive depression screening. Depression is commonly associated with obesity and often results in emotional eating behaviors. We will monitor this closely and work on CBT to help improve the non-hunger eating patterns. Referral to Psychology may be required if no improvement is seen as she continues in our clinic.  6. At risk for activity intolerance Chalene was given approximately 15 minutes of exercise intolerance counseling today. She is 45 y.o. female and has risk factors exercise intolerance including obesity. We discussed intensive lifestyle modifications today with an emphasis on specific weight loss instructions and strategies. Myrella will slowly increase activity as tolerated.  Repetitive spaced learning was employed today to elicit superior memory formation and behavioral change.  7. Class 3 severe obesity with serious comorbidity and body mass index (BMI) of 50.0 to 59.9 in adult, unspecified obesity type (HCC) Codee is currently in the action stage of change and her goal is to continue with weight loss efforts. I recommend Lajune begin the structured treatment plan as follows:  She has agreed to the Category 3 Plan.  Exercise goals: No exercise has been prescribed for now, while we concentrate on nutritional changes.  Behavioral modification strategies: increasing lean protein intake.  She was informed of the importance  of frequent follow-up visits to maximize her success with intensive lifestyle modifications for her multiple health conditions. She was informed we would discuss her lab results at her next visit unless there is a critical issue that needs to be addressed sooner. Juvia agreed to keep her next visit at the agreed upon time to discuss these results.  Objective:   Blood pressure 133/90, pulse 85, temperature 98.3 F (36.8 C), height 5\' 4"  (1.626 m), weight 295 lb (133.8 kg), SpO2 96 %. Body mass index is 50.64 kg/m.  EKG: Normal sinus rhythm, rate 90 BPM.  Indirect Calorimeter completed today shows a VO2 of 249 and a REE of 1735.  Her calculated basal metabolic rate is thus her basal metabolic rate is worse than expected.  General: Cooperative, alert, well developed, in no acute distress. HEENT: Conjunctivae and lids unremarkable. Cardiovascular: Regular rhythm.  Lungs: Normal work of breathing. Neurologic: No focal deficits.   Lab Results  Component Value Date   CREATININE 0.98 12/13/2020   BUN 12 12/13/2020   NA 137 12/13/2020   K 4.9 12/13/2020   CL 101 12/13/2020   CO2 26 12/13/2020   Lab Results  Component Value Date   ALT 13 12/13/2020   AST 16 12/13/2020   ALKPHOS 97 12/13/2020   BILITOT 0.3 12/13/2020   Lab Results  Component Value Date  HGBA1C 5.4 12/13/2020   HGBA1C 5.5 05/30/2015   Lab Results  Component Value Date   INSULIN 21.4 12/13/2020   Lab Results  Component Value Date   TSH 4.200 12/13/2020   Lab Results  Component Value Date   CHOL 184 12/13/2020   HDL 37 (L) 12/13/2020   LDLCALC 110 (H) 12/13/2020   TRIG 214 (H) 12/13/2020   CHOLHDL 4.5 05/30/2015   Lab Results  Component Value Date   WBC 10.8 12/13/2020   HGB 13.6 12/13/2020   HCT 42.1 12/13/2020   MCV 92 12/13/2020   PLT 331 12/13/2020   No results found for: IRON, TIBC, FERRITIN  Attestation Statements:   Reviewed by clinician on day of visit: allergies, medications,  problem list, medical history, surgical history, family history, social history, and previous encounter notes.   I, Burt Knack, am acting as transcriptionist for Quillian Quince, MD.  I have reviewed the above documentation for accuracy and completeness, and I agree with the above. - Quillian Quince, MD

## 2020-12-21 DIAGNOSIS — J301 Allergic rhinitis due to pollen: Secondary | ICD-10-CM | POA: Diagnosis not present

## 2020-12-21 DIAGNOSIS — J3081 Allergic rhinitis due to animal (cat) (dog) hair and dander: Secondary | ICD-10-CM | POA: Diagnosis not present

## 2020-12-21 DIAGNOSIS — J3089 Other allergic rhinitis: Secondary | ICD-10-CM | POA: Diagnosis not present

## 2020-12-26 ENCOUNTER — Ambulatory Visit (INDEPENDENT_AMBULATORY_CARE_PROVIDER_SITE_OTHER): Payer: BC Managed Care – PPO | Admitting: Family Medicine

## 2020-12-26 ENCOUNTER — Encounter (INDEPENDENT_AMBULATORY_CARE_PROVIDER_SITE_OTHER): Payer: Self-pay | Admitting: Family Medicine

## 2020-12-26 ENCOUNTER — Other Ambulatory Visit: Payer: Self-pay

## 2020-12-26 VITALS — BP 103/74 | HR 100 | Temp 98.4°F | Ht 64.0 in | Wt 293.0 lb

## 2020-12-26 DIAGNOSIS — E8881 Metabolic syndrome: Secondary | ICD-10-CM

## 2020-12-26 DIAGNOSIS — Z9189 Other specified personal risk factors, not elsewhere classified: Secondary | ICD-10-CM

## 2020-12-26 DIAGNOSIS — E559 Vitamin D deficiency, unspecified: Secondary | ICD-10-CM

## 2020-12-26 DIAGNOSIS — Z6841 Body Mass Index (BMI) 40.0 and over, adult: Secondary | ICD-10-CM

## 2020-12-26 DIAGNOSIS — E782 Mixed hyperlipidemia: Secondary | ICD-10-CM

## 2020-12-26 MED ORDER — VITAMIN D (ERGOCALCIFEROL) 1.25 MG (50000 UNIT) PO CAPS: ORAL_CAPSULE | ORAL | 0 refills | Status: DC

## 2020-12-27 ENCOUNTER — Encounter (INDEPENDENT_AMBULATORY_CARE_PROVIDER_SITE_OTHER): Payer: Self-pay | Admitting: Family Medicine

## 2020-12-27 NOTE — Progress Notes (Signed)
Chief Complaint:   OBESITY Sarah Fields is here to discuss her progress with her obesity treatment plan along with follow-up of her obesity related diagnoses. Sarah Fields is on the Category 3 Plan and states she is following her eating plan approximately 90% of the time. Sarah Fields states she is doing 0 minutes 0 times per week.  Today's visit was #: 2 Starting weight: 295 lbs Starting date: 12/13/2020 Today's weight: 293 lbs Today's date: 12/26/2020 Total lbs lost to date: 2 Total lbs lost since last in-office visit: 2  Interim History: Sarah Fields has done well with weight loss. She struggled to eat all of the food on her plan. She struggled a bit with timing especially related work.  Subjective:   1. Vitamin D deficiency Sarah Fields has been on Vit D prescription for "years", and her Vit D level is still low and worsening. She notes fatigue. I discussed labs with the patient today.  2. Mixed hyperlipidemia Sarah Fields has a new diagnosis of mixed hyperlipidemia. Her LDL and triglycerides are elevated, and HDL is low. She is not on a statin, and she is working on diet and exercise. I discussed labs with the patient today.  3. Insulin resistance Sarah Fields's fasting glucose and A1c are within normal limits, but her fasting insulin is significantly elevated. She notes morning anorexia and is prone to "grazing" eating patterns. Decreased polyphagia noted on her Category 3 plan.  4. At risk for diabetes mellitus Sarah Fields is at higher than average risk for developing diabetes due to obesity.   Assessment/Plan:   1. Vitamin D deficiency Low Vitamin D level contributes to fatigue and are associated with obesity, breast, and colon cancer. Sarah Fields agreed to increase prescription Vitamin D to 50,000 IU every 3 days with no refills, and she will continue Ca+ plus Vit D OTC. She will follow-up for routine testing of Vitamin D, at least 2-3 times per year to avoid over-replacement.  - Vitamin D,  Ergocalciferol, (DRISDOL) 1.25 MG (50000 UNIT) CAPS capsule; Take 1 capsule by mouth once every 3 days.  Dispense: 10 capsule; Refill: 0  2. Mixed hyperlipidemia Cardiovascular risk and specific lipid/LDL goals reviewed. We discussed several lifestyle modifications today. We will recheck labs in 3 months. Sarah Fields will continue to work on diet, exercise and weight loss efforts. Orders and follow up as documented in patient record.   3. Insulin resistance Sarah Fields will defer metformin, and will continue Category 3 and will work on weight loss, exercise, and decreasing simple carbohydrates to help decrease the risk of diabetes. We will recheck labs in 3 months. Jenniferagreed to follow-up with [place] as directed to closely monitor her progress.  4. At risk for diabetes mellitus Sarah Fields was given approximately 30 minutes of diabetes education and counseling today. We discussed intensive lifestyle modifications today with an emphasis on weight loss as well as increasing exercise and decreasing simple carbohydrates in her diet. We also reviewed medication options with an emphasis on risk versus benefit of those discussed.   Repetitive spaced learning was employed today to elicit superior memory formation and behavioral change.  5. Class 3 severe obesity with serious comorbidity and body mass index (BMI) of 50.0 to 59.9 in adult, unspecified obesity type (HCC) Sarah Fields is currently in the action stage of change. As such, her goal is to continue with weight loss efforts. She has agreed to the Category 3 Plan.   Dinner ideas were discussed today.  Behavioral modification strategies: increasing lean protein intake and no skipping meals.  Sarah Fields has agreed to follow-up with our clinic in 2 weeks. She was informed of the importance of frequent follow-up visits to maximize her success with intensive lifestyle modifications for her multiple health conditions.   Objective:   Blood pressure 103/74, pulse  100, temperature 98.4 F (36.9 C), height 5\' 4"  (1.626 m), weight 293 lb (132.9 kg), SpO2 95 %. Body mass index is 50.29 kg/m.  General: Cooperative, alert, well developed, in no acute distress. HEENT: Conjunctivae and lids unremarkable. Cardiovascular: Regular rhythm.  Lungs: Normal work of breathing. Neurologic: No focal deficits.   Lab Results  Component Value Date   CREATININE 0.98 12/13/2020   BUN 12 12/13/2020   NA 137 12/13/2020   K 4.9 12/13/2020   CL 101 12/13/2020   CO2 26 12/13/2020   Lab Results  Component Value Date   ALT 13 12/13/2020   AST 16 12/13/2020   ALKPHOS 97 12/13/2020   BILITOT 0.3 12/13/2020   Lab Results  Component Value Date   HGBA1C 5.4 12/13/2020   HGBA1C 5.5 05/30/2015   Lab Results  Component Value Date   INSULIN 21.4 12/13/2020   Lab Results  Component Value Date   TSH 4.200 12/13/2020   Lab Results  Component Value Date   CHOL 184 12/13/2020   HDL 37 (L) 12/13/2020   LDLCALC 110 (H) 12/13/2020   TRIG 214 (H) 12/13/2020   CHOLHDL 4.5 05/30/2015   Lab Results  Component Value Date   WBC 10.8 12/13/2020   HGB 13.6 12/13/2020   HCT 42.1 12/13/2020   MCV 92 12/13/2020   PLT 331 12/13/2020   No results found for: IRON, TIBC, FERRITIN  Attestation Statements:   Reviewed by clinician on day of visit: allergies, medications, problem list, medical history, surgical history, family history, social history, and previous encounter notes.   I, 12/15/2020, am acting as transcriptionist for Burt Knack, MD.  I have reviewed the above documentation for accuracy and completeness, and I agree with the above. -  Quillian Quince, MD

## 2020-12-28 DIAGNOSIS — J301 Allergic rhinitis due to pollen: Secondary | ICD-10-CM | POA: Diagnosis not present

## 2020-12-28 DIAGNOSIS — J3081 Allergic rhinitis due to animal (cat) (dog) hair and dander: Secondary | ICD-10-CM | POA: Diagnosis not present

## 2020-12-28 DIAGNOSIS — J3089 Other allergic rhinitis: Secondary | ICD-10-CM | POA: Diagnosis not present

## 2021-01-04 DIAGNOSIS — J3081 Allergic rhinitis due to animal (cat) (dog) hair and dander: Secondary | ICD-10-CM | POA: Diagnosis not present

## 2021-01-04 DIAGNOSIS — J301 Allergic rhinitis due to pollen: Secondary | ICD-10-CM | POA: Diagnosis not present

## 2021-01-04 DIAGNOSIS — J3089 Other allergic rhinitis: Secondary | ICD-10-CM | POA: Diagnosis not present

## 2021-01-07 DIAGNOSIS — M79671 Pain in right foot: Secondary | ICD-10-CM | POA: Diagnosis not present

## 2021-01-10 ENCOUNTER — Ambulatory Visit (INDEPENDENT_AMBULATORY_CARE_PROVIDER_SITE_OTHER): Payer: BC Managed Care – PPO | Admitting: Physician Assistant

## 2021-01-10 ENCOUNTER — Other Ambulatory Visit: Payer: Self-pay

## 2021-01-10 ENCOUNTER — Encounter (INDEPENDENT_AMBULATORY_CARE_PROVIDER_SITE_OTHER): Payer: Self-pay | Admitting: Physician Assistant

## 2021-01-10 VITALS — BP 121/86 | HR 87 | Temp 98.8°F | Ht 64.0 in | Wt 285.0 lb

## 2021-01-10 DIAGNOSIS — J301 Allergic rhinitis due to pollen: Secondary | ICD-10-CM | POA: Diagnosis not present

## 2021-01-10 DIAGNOSIS — E8881 Metabolic syndrome: Secondary | ICD-10-CM

## 2021-01-10 DIAGNOSIS — J3089 Other allergic rhinitis: Secondary | ICD-10-CM | POA: Diagnosis not present

## 2021-01-10 DIAGNOSIS — E559 Vitamin D deficiency, unspecified: Secondary | ICD-10-CM

## 2021-01-10 DIAGNOSIS — J3081 Allergic rhinitis due to animal (cat) (dog) hair and dander: Secondary | ICD-10-CM | POA: Diagnosis not present

## 2021-01-10 DIAGNOSIS — Z6841 Body Mass Index (BMI) 40.0 and over, adult: Secondary | ICD-10-CM

## 2021-01-14 NOTE — Progress Notes (Signed)
Chief Complaint:   OBESITY Sarah Fields is here to discuss her progress with her obesity treatment plan along with follow-up of her obesity related diagnoses. Sarah Fields is on the Category 3 Plan and states she is following her eating plan approximately 90% of the time. Sarah Fields states she is doing 0 minutes 0 times per week.  Today's visit was #: 3 Starting weight: 295 lbs Starting date: 12/13/2020 Today's weight: 285 lbs Today's date: 01/10/2021 Total lbs lost to date: 10 Total lbs lost since last in-office visit: 8  Interim History: Sarah Fields did very well with weight loss. She is breaking up her food throughout the day. She has a checklist of the foods from the plan and checks off her food daily. Her hunger is controlled.  Subjective:   1. Insulin resistance Sarah Fields is not on medications, and she denies polyphagia.  2. Vitamin D deficiency Sarah Fields's last Vit D level was not at goal. She is taking Vit D twice weekly, and she is tolerating it well.  Assessment/Plan:   1. Insulin resistance Sarah Fields will continue to work on weight loss, exercise, and decreasing simple carbohydrates to help decrease the risk of diabetes. Sarah Fields agreed to follow-up with [place] as directed to closely monitor her progress.  2. Vitamin D deficiency Low Vitamin D level contributes to fatigue and are associated with obesity, breast, and colon cancer. Sarah Fields agreed to continue taking prescription Vitamin D 50,000 IU every week and will follow-up for routine testing of Vitamin D, at least 2-3 times per year to avoid over-replacement.  3. Class 3 severe obesity with serious comorbidity and body mass index (BMI) of 45.0 to 49.9 in adult, unspecified obesity type (HCC) Sarah Fields is currently in the action stage of change. As such, her goal is to continue with weight loss efforts. She has agreed to the Category 3 Plan.   Exercise goals: No exercise has been prescribed at this time.  Behavioral modification  strategies: meal planning and cooking strategies and keeping healthy foods in the home.  Sarah Fields has agreed to follow-up with our clinic in 2 weeks. She was informed of the importance of frequent follow-up visits to maximize her success with intensive lifestyle modifications for her multiple health conditions.   Objective:   Blood pressure 121/86, pulse 87, temperature 98.8 F (37.1 C), height 5\' 4"  (1.626 m), weight 285 lb (129.3 kg), SpO2 98 %. Body mass index is 48.92 kg/m.  General: Cooperative, alert, well developed, in no acute distress. HEENT: Conjunctivae and lids unremarkable. Cardiovascular: Regular rhythm.  Lungs: Normal work of breathing. Neurologic: No focal deficits.   Lab Results  Component Value Date   CREATININE 0.98 12/13/2020   BUN 12 12/13/2020   NA 137 12/13/2020   K 4.9 12/13/2020   CL 101 12/13/2020   CO2 26 12/13/2020   Lab Results  Component Value Date   ALT 13 12/13/2020   AST 16 12/13/2020   ALKPHOS 97 12/13/2020   BILITOT 0.3 12/13/2020   Lab Results  Component Value Date   HGBA1C 5.4 12/13/2020   HGBA1C 5.5 05/30/2015   Lab Results  Component Value Date   INSULIN 21.4 12/13/2020   Lab Results  Component Value Date   TSH 4.200 12/13/2020   Lab Results  Component Value Date   CHOL 184 12/13/2020   HDL 37 (L) 12/13/2020   LDLCALC 110 (H) 12/13/2020   TRIG 214 (H) 12/13/2020   CHOLHDL 4.5 05/30/2015   Lab Results  Component Value Date  WBC 10.8 12/13/2020   HGB 13.6 12/13/2020   HCT 42.1 12/13/2020   MCV 92 12/13/2020   PLT 331 12/13/2020   No results found for: IRON, TIBC, FERRITIN  Attestation Statements:   Reviewed by clinician on day of visit: allergies, medications, problem list, medical history, surgical history, family history, social history, and previous encounter notes.  Time spent on visit including pre-visit chart review and post-visit care and charting was 30 minutes.    Trude Mcburney, am acting as  transcriptionist for [employer], PA-C.  I have reviewed the above documentation for accuracy and completeness, and I agree with the above. Alois Cliche, PA-C

## 2021-01-15 ENCOUNTER — Other Ambulatory Visit (INDEPENDENT_AMBULATORY_CARE_PROVIDER_SITE_OTHER): Payer: Self-pay | Admitting: Family Medicine

## 2021-01-15 DIAGNOSIS — E559 Vitamin D deficiency, unspecified: Secondary | ICD-10-CM

## 2021-01-15 NOTE — Telephone Encounter (Signed)
Last seen Sarah Fields 

## 2021-01-18 DIAGNOSIS — J3081 Allergic rhinitis due to animal (cat) (dog) hair and dander: Secondary | ICD-10-CM | POA: Diagnosis not present

## 2021-01-18 DIAGNOSIS — J3089 Other allergic rhinitis: Secondary | ICD-10-CM | POA: Diagnosis not present

## 2021-01-18 DIAGNOSIS — J301 Allergic rhinitis due to pollen: Secondary | ICD-10-CM | POA: Diagnosis not present

## 2021-01-24 ENCOUNTER — Encounter (INDEPENDENT_AMBULATORY_CARE_PROVIDER_SITE_OTHER): Payer: Self-pay | Admitting: Family Medicine

## 2021-01-24 ENCOUNTER — Other Ambulatory Visit: Payer: Self-pay

## 2021-01-24 ENCOUNTER — Ambulatory Visit (INDEPENDENT_AMBULATORY_CARE_PROVIDER_SITE_OTHER): Payer: BC Managed Care – PPO | Admitting: Family Medicine

## 2021-01-24 VITALS — BP 123/83 | HR 79 | Temp 98.9°F | Ht 64.0 in | Wt 286.0 lb

## 2021-01-24 DIAGNOSIS — Z9189 Other specified personal risk factors, not elsewhere classified: Secondary | ICD-10-CM | POA: Diagnosis not present

## 2021-01-24 DIAGNOSIS — Z6841 Body Mass Index (BMI) 40.0 and over, adult: Secondary | ICD-10-CM

## 2021-01-24 DIAGNOSIS — E8881 Metabolic syndrome: Secondary | ICD-10-CM

## 2021-01-24 DIAGNOSIS — J301 Allergic rhinitis due to pollen: Secondary | ICD-10-CM | POA: Diagnosis not present

## 2021-01-24 DIAGNOSIS — E559 Vitamin D deficiency, unspecified: Secondary | ICD-10-CM | POA: Diagnosis not present

## 2021-01-24 DIAGNOSIS — J3089 Other allergic rhinitis: Secondary | ICD-10-CM | POA: Diagnosis not present

## 2021-01-24 DIAGNOSIS — J3081 Allergic rhinitis due to animal (cat) (dog) hair and dander: Secondary | ICD-10-CM | POA: Diagnosis not present

## 2021-01-24 MED ORDER — METFORMIN HCL 500 MG PO TABS: 500.0000 mg | ORAL_TABLET | Freq: Every day | ORAL | 0 refills | Status: DC

## 2021-01-28 DIAGNOSIS — M79671 Pain in right foot: Secondary | ICD-10-CM | POA: Diagnosis not present

## 2021-01-28 NOTE — Progress Notes (Signed)
Chief Complaint:   OBESITY Sarah Fields is here to discuss her progress with her obesity treatment plan along with follow-up of her obesity related diagnoses. Sarah Fields is on the Category 3 Plan and states she is following her eating plan approximately 90% of the time. Sarah Fields states she is doing 0 minutes 0 times per week.  Today's visit was #: 4 Starting weight: 295 lbs Starting date: 12/13/2020 Today's weight: 286 lbs Today's date: 01/24/2021 Total lbs lost to date: 9 Total lbs lost since last in-office visit: 0  Interim History: Sarah Fields has had extra challenges with traveling and eating out. She did well trying to make better choices, but she wasn't able to continue weight loss due to this.  Subjective:   1. Insulin resistance Sarah Fields is working on diet and exercise, but she starting to struggle a bit. She notes polyphagia.  2. Vitamin D deficiency Sarah Fields is stable on Vit D at increased dose of 2 times per week. She denies nausea, vomiting, or muscle weakness.  3. At risk for diabetes mellitus Sarah Fields is at higher than average risk for developing diabetes due to obesity.   Assessment/Plan:   1. Insulin resistance Sarah Fields agreed to start metformin 500 mg q AM with food, with no refills. She will continue to work on weight loss, exercise, and decreasing simple carbohydrates to help decrease the risk of diabetes. Sarah Fields agreed to follow-up with [place] as directed to closely monitor her progress.  - metFORMIN (GLUCOPHAGE) 500 MG tablet; Take 1 tablet (500 mg total) by mouth daily with breakfast.  Dispense: 30 tablet; Refill: 0  2. Vitamin D deficiency Low Vitamin D level contributes to fatigue and are associated with obesity, breast, and colon cancer. Sarah Fields agreed to continue taking prescription Vitamin D 50,000 IU every 3 days, and we will recheck labs in 6 weeks. She will follow-up for routine testing of Vitamin D, at least 2-3 times per year to avoid  over-replacement.  3. At risk for diabetes mellitus Sarah Fields was given approximately 15 minutes of diabetes education and counseling today. We discussed intensive lifestyle modifications today with an emphasis on weight loss as well as increasing exercise and decreasing simple carbohydrates in her diet. We also reviewed medication options with an emphasis on risk versus benefit of those discussed.   Repetitive spaced learning was employed today to elicit superior memory formation and behavioral change.  4. Class 3 severe obesity with serious comorbidity and body mass index (BMI) of 45.0 to 49.9 in adult, unspecified obesity type (HCC) Sarah Fields is currently in the action stage of change. As such, her goal is to continue with weight loss efforts. She has agreed to the Category 3 Plan and keeping a food journal and adhering to recommended goals of 450-600 calories and 40+ grams of protein at lunch or supper daily.   Behavioral modification strategies: meal planning and cooking strategies and travel eating strategies.  Sarah Fields has agreed to follow-up with our clinic in 2 weeks. She was informed of the importance of frequent follow-up visits to maximize her success with intensive lifestyle modifications for her multiple health conditions.   Objective:   Blood pressure 123/83, pulse 79, temperature 98.9 F (37.2 C), height 5\' 4"  (1.626 m), weight 286 lb (129.7 kg), SpO2 99 %. Body mass index is 49.09 kg/m.  General: Cooperative, alert, well developed, in no acute distress. HEENT: Conjunctivae and lids unremarkable. Cardiovascular: Regular rhythm.  Lungs: Normal work of breathing. Neurologic: No focal deficits.   Lab Results  Component Value Date   CREATININE 0.98 12/13/2020   BUN 12 12/13/2020   NA 137 12/13/2020   K 4.9 12/13/2020   CL 101 12/13/2020   CO2 26 12/13/2020   Lab Results  Component Value Date   ALT 13 12/13/2020   AST 16 12/13/2020   ALKPHOS 97 12/13/2020   BILITOT  0.3 12/13/2020   Lab Results  Component Value Date   HGBA1C 5.4 12/13/2020   HGBA1C 5.5 05/30/2015   Lab Results  Component Value Date   INSULIN 21.4 12/13/2020   Lab Results  Component Value Date   TSH 4.200 12/13/2020   Lab Results  Component Value Date   CHOL 184 12/13/2020   HDL 37 (L) 12/13/2020   LDLCALC 110 (H) 12/13/2020   TRIG 214 (H) 12/13/2020   CHOLHDL 4.5 05/30/2015   Lab Results  Component Value Date   WBC 10.8 12/13/2020   HGB 13.6 12/13/2020   HCT 42.1 12/13/2020   MCV 92 12/13/2020   PLT 331 12/13/2020   No results found for: IRON, TIBC, FERRITIN  Attestation Statements:   Reviewed by clinician on day of visit: allergies, medications, problem list, medical history, surgical history, family history, social history, and previous encounter notes.   I, Burt Knack, am acting as transcriptionist for Quillian Quince, MD.  I have reviewed the above documentation for accuracy and completeness, and I agree with the above. -  Quillian Quince, MD

## 2021-01-31 DIAGNOSIS — J3089 Other allergic rhinitis: Secondary | ICD-10-CM | POA: Diagnosis not present

## 2021-01-31 DIAGNOSIS — J301 Allergic rhinitis due to pollen: Secondary | ICD-10-CM | POA: Diagnosis not present

## 2021-01-31 DIAGNOSIS — J3081 Allergic rhinitis due to animal (cat) (dog) hair and dander: Secondary | ICD-10-CM | POA: Diagnosis not present

## 2021-02-08 DIAGNOSIS — J301 Allergic rhinitis due to pollen: Secondary | ICD-10-CM | POA: Diagnosis not present

## 2021-02-08 DIAGNOSIS — J3089 Other allergic rhinitis: Secondary | ICD-10-CM | POA: Diagnosis not present

## 2021-02-08 DIAGNOSIS — J3081 Allergic rhinitis due to animal (cat) (dog) hair and dander: Secondary | ICD-10-CM | POA: Diagnosis not present

## 2021-02-12 ENCOUNTER — Encounter (INDEPENDENT_AMBULATORY_CARE_PROVIDER_SITE_OTHER): Payer: Self-pay | Admitting: Family Medicine

## 2021-02-12 ENCOUNTER — Other Ambulatory Visit: Payer: Self-pay

## 2021-02-12 ENCOUNTER — Ambulatory Visit (INDEPENDENT_AMBULATORY_CARE_PROVIDER_SITE_OTHER): Payer: BC Managed Care – PPO | Admitting: Family Medicine

## 2021-02-12 VITALS — BP 116/74 | HR 72 | Temp 98.3°F | Ht 64.0 in | Wt 280.0 lb

## 2021-02-12 DIAGNOSIS — Z6841 Body Mass Index (BMI) 40.0 and over, adult: Secondary | ICD-10-CM

## 2021-02-12 DIAGNOSIS — E8881 Metabolic syndrome: Secondary | ICD-10-CM | POA: Diagnosis not present

## 2021-02-14 NOTE — Progress Notes (Signed)
Chief Complaint:   OBESITY Sarah Fields is here to discuss her progress with her obesity treatment plan along with follow-up of her obesity related diagnoses. Sarah Fields is on the Category 3 Plan and keeping a food journal and adhering to recommended goals of 450-600 calories and 40+ grams of protein at lunch or supper daily and states she is following her eating plan approximately 90% of the time. Sarah Fields states she is doing 0 minutes 0 times per week.  Today's visit was #: 5 Starting weight: 295 lbs Starting date: 12/13/2020 Today's weight: 280 lbs Today's date: 02/12/2021 Total lbs lost to date: 15 Total lbs lost since last in-office visit: 6  Interim History: Sarah Fields continues to do well with weight loss. She has been traveling on weekends and she has done a good job making better choices while on the road. She is working on increasing protein and she is getting better at this.  Subjective:   1. Insulin resistance Sarah Fields had some mild GI upset the first 2 days and then it resolved. She is doing well with diet and weight loss.  Assessment/Plan:   1. Insulin resistance Sarah Fields will continue her medications, diet, exercise, and decreasing simple carbohydrates to help decrease the risk of diabetes. Sarah Fields agreed to follow-up with [place] as directed to closely monitor her progress.  2. Class 3 severe obesity with serious comorbidity and body mass index (BMI) of 50.0 to 59.9 in adult, unspecified obesity type (HCC) Sarah Fields is currently in the action stage of change. As such, her goal is to continue with weight loss efforts. She has agreed to the Category 3 Plan and keeping a food journal and adhering to recommended goals of 450-600 calories and 40+ grams of protein at supper daily. With lunch options.  Exercise goals: Exercise bands were discussed, and handout was given.  Behavioral modification strategies: travel eating strategies.  Sarah Fields has agreed to follow-up with our clinic  in 2 to 3 weeks. She was informed of the importance of frequent follow-up visits to maximize her success with intensive lifestyle modifications for her multiple health conditions.   Objective:   Blood pressure 116/74, pulse 72, temperature 98.3 F (36.8 C), height 5\' 4"  (1.626 m), weight 280 lb (127 kg), SpO2 96 %. Body mass index is 48.06 kg/m.  General: Cooperative, alert, well developed, in no acute distress. HEENT: Conjunctivae and lids unremarkable. Cardiovascular: Regular rhythm.  Lungs: Normal work of breathing. Neurologic: No focal deficits.   Lab Results  Component Value Date   CREATININE 0.98 12/13/2020   BUN 12 12/13/2020   NA 137 12/13/2020   K 4.9 12/13/2020   CL 101 12/13/2020   CO2 26 12/13/2020   Lab Results  Component Value Date   ALT 13 12/13/2020   AST 16 12/13/2020   ALKPHOS 97 12/13/2020   BILITOT 0.3 12/13/2020   Lab Results  Component Value Date   HGBA1C 5.4 12/13/2020   HGBA1C 5.5 05/30/2015   Lab Results  Component Value Date   INSULIN 21.4 12/13/2020   Lab Results  Component Value Date   TSH 4.200 12/13/2020   Lab Results  Component Value Date   CHOL 184 12/13/2020   HDL 37 (L) 12/13/2020   LDLCALC 110 (H) 12/13/2020   TRIG 214 (H) 12/13/2020   CHOLHDL 4.5 05/30/2015   Lab Results  Component Value Date   WBC 10.8 12/13/2020   HGB 13.6 12/13/2020   HCT 42.1 12/13/2020   MCV 92 12/13/2020   PLT 331  12/13/2020   No results found for: IRON, TIBC, FERRITIN  Attestation Statements:   Reviewed by clinician on day of visit: allergies, medications, problem list, medical history, surgical history, family history, social history, and previous encounter notes.  Time spent on visit including pre-visit chart review and post-visit care and charting was 20 minutes.    I, Burt Knack, am acting as transcriptionist for Quillian Quince, MD.  I have reviewed the above documentation for accuracy and completeness, and I agree with the above.  -  Quillian Quince, MD

## 2021-02-15 ENCOUNTER — Other Ambulatory Visit (INDEPENDENT_AMBULATORY_CARE_PROVIDER_SITE_OTHER): Payer: Self-pay | Admitting: Family Medicine

## 2021-02-15 DIAGNOSIS — J301 Allergic rhinitis due to pollen: Secondary | ICD-10-CM | POA: Diagnosis not present

## 2021-02-15 DIAGNOSIS — J3081 Allergic rhinitis due to animal (cat) (dog) hair and dander: Secondary | ICD-10-CM | POA: Diagnosis not present

## 2021-02-15 DIAGNOSIS — E8881 Metabolic syndrome: Secondary | ICD-10-CM

## 2021-02-15 DIAGNOSIS — J3089 Other allergic rhinitis: Secondary | ICD-10-CM | POA: Diagnosis not present

## 2021-02-18 NOTE — Telephone Encounter (Signed)
Last OV with Dr. Beasley 

## 2021-02-18 NOTE — Telephone Encounter (Signed)
Please advise 

## 2021-02-19 ENCOUNTER — Ambulatory Visit (INDEPENDENT_AMBULATORY_CARE_PROVIDER_SITE_OTHER): Payer: BC Managed Care – PPO | Admitting: Family Medicine

## 2021-02-19 NOTE — Telephone Encounter (Signed)
RF x 1

## 2021-02-19 NOTE — Telephone Encounter (Signed)
Rx refilled.

## 2021-02-20 ENCOUNTER — Encounter (INDEPENDENT_AMBULATORY_CARE_PROVIDER_SITE_OTHER): Payer: Self-pay

## 2021-02-22 DIAGNOSIS — J3089 Other allergic rhinitis: Secondary | ICD-10-CM | POA: Diagnosis not present

## 2021-02-22 DIAGNOSIS — J301 Allergic rhinitis due to pollen: Secondary | ICD-10-CM | POA: Diagnosis not present

## 2021-02-22 DIAGNOSIS — J3081 Allergic rhinitis due to animal (cat) (dog) hair and dander: Secondary | ICD-10-CM | POA: Diagnosis not present

## 2021-03-01 DIAGNOSIS — J301 Allergic rhinitis due to pollen: Secondary | ICD-10-CM | POA: Diagnosis not present

## 2021-03-01 DIAGNOSIS — J3081 Allergic rhinitis due to animal (cat) (dog) hair and dander: Secondary | ICD-10-CM | POA: Diagnosis not present

## 2021-03-01 DIAGNOSIS — J3089 Other allergic rhinitis: Secondary | ICD-10-CM | POA: Diagnosis not present

## 2021-03-04 ENCOUNTER — Encounter (INDEPENDENT_AMBULATORY_CARE_PROVIDER_SITE_OTHER): Payer: Self-pay | Admitting: Family Medicine

## 2021-03-04 ENCOUNTER — Other Ambulatory Visit: Payer: Self-pay

## 2021-03-04 ENCOUNTER — Ambulatory Visit (INDEPENDENT_AMBULATORY_CARE_PROVIDER_SITE_OTHER): Payer: BC Managed Care – PPO | Admitting: Family Medicine

## 2021-03-04 VITALS — BP 117/73 | HR 88 | Temp 98.6°F | Ht 64.0 in | Wt 280.0 lb

## 2021-03-04 DIAGNOSIS — Z6841 Body Mass Index (BMI) 40.0 and over, adult: Secondary | ICD-10-CM

## 2021-03-04 DIAGNOSIS — E8881 Metabolic syndrome: Secondary | ICD-10-CM | POA: Diagnosis not present

## 2021-03-07 ENCOUNTER — Ambulatory Visit (INDEPENDENT_AMBULATORY_CARE_PROVIDER_SITE_OTHER): Payer: BC Managed Care – PPO | Admitting: Family Medicine

## 2021-03-07 DIAGNOSIS — J301 Allergic rhinitis due to pollen: Secondary | ICD-10-CM | POA: Diagnosis not present

## 2021-03-07 DIAGNOSIS — J3081 Allergic rhinitis due to animal (cat) (dog) hair and dander: Secondary | ICD-10-CM | POA: Diagnosis not present

## 2021-03-07 DIAGNOSIS — J3089 Other allergic rhinitis: Secondary | ICD-10-CM | POA: Diagnosis not present

## 2021-03-09 ENCOUNTER — Telehealth: Payer: Self-pay | Admitting: Neurology

## 2021-03-09 ENCOUNTER — Other Ambulatory Visit: Payer: Self-pay | Admitting: Neurology

## 2021-03-09 MED ORDER — PRIMIDONE 50 MG PO TABS: ORAL_TABLET | ORAL | 4 refills | Status: DC

## 2021-03-09 NOTE — Telephone Encounter (Signed)
Tori or Jillian: Patient is doing well. Would you please cancel her upcoming appointment this week and call her to reschedule a follow up the beginning of next year, February-ish, please? Can be a video or in office either. Or she can always call back to schedule an appointment if she is not ready now but cancel her appointment this week and place a hold on it thanks. [facility] fyi)

## 2021-03-11 NOTE — Telephone Encounter (Signed)
I called pt and LVM letting her know we cancelled her appointment on Wednesday and Dr. Lucia Gaskins said to go ahead and reschedule for next year. I did not make appointment, just told her to call us back to schedule and let her know to do it at least about four months in advance. I cancelled appt and put it on hold.

## 2021-03-13 ENCOUNTER — Ambulatory Visit: Payer: BC Managed Care – PPO | Admitting: Neurology

## 2021-03-13 NOTE — Progress Notes (Signed)
Chief Complaint:   OBESITY Janeth is here to discuss her progress with her obesity treatment plan along with follow-up of her obesity related diagnoses. Laticha is on the Category 3 Plan and keeping a food journal and adhering to recommended goals of 450-600 calories and 40+ grams of protein at supper daily and states she is following her eating plan approximately 80% of the time. Marely states she is doing Band-it 1 time per week.  Today's visit was #: 6 Starting weight: 295 lbs Starting date: 12/13/2020 Today's weight: 280 lbs Today's date: 03/04/2021 Total lbs lost to date: 15 Total lbs lost since last in-office visit: 0  Interim History: Shawna has been struggling with dental problems which makes following her plan more difficult. She has done well maintaining her weight despite the challenges.   Subjective:   1. Insulin resistance Sola continues to work on diet and she has done well maintaining her weight. She is tolerating metformin well at this point.  Assessment/Plan:   1. Insulin resistance Charise will continue metformin as is, and will continue to work on weight loss, exercise, and decreasing simple carbohydrates to help decrease the risk of diabetes. Allona agreed to follow-up with [place] as directed to closely monitor her progress.  2. Obesity with current BMI of 48.1 Arriel is currently in the action stage of change. As such, her goal is to continue with weight loss efforts. She has agreed to the Category 3 Plan and keeping a food journal and adhering to recommended goals of 450-600 calories and 40+ grams of protein at supper daily.   Exercise goals: As is.  Behavioral modification strategies: keeping healthy foods in the home and dealing with family or coworker sabotage.  Hiliary has agreed to follow-up with our clinic in 2 weeks. She was informed of the importance of frequent follow-up visits to maximize her success with intensive lifestyle  modifications for her multiple health conditions.   Objective:   Blood pressure 117/73, pulse 88, temperature 98.6 F (37 C), height 5\' 4"  (1.626 m), weight 280 lb (127 kg), SpO2 96 %. Body mass index is 48.06 kg/m.  General: Cooperative, alert, well developed, in no acute distress. HEENT: Conjunctivae and lids unremarkable. Cardiovascular: Regular rhythm.  Lungs: Normal work of breathing. Neurologic: No focal deficits.   Lab Results  Component Value Date   CREATININE 0.98 12/13/2020   BUN 12 12/13/2020   NA 137 12/13/2020   K 4.9 12/13/2020   CL 101 12/13/2020   CO2 26 12/13/2020   Lab Results  Component Value Date   ALT 13 12/13/2020   AST 16 12/13/2020   ALKPHOS 97 12/13/2020   BILITOT 0.3 12/13/2020   Lab Results  Component Value Date   HGBA1C 5.4 12/13/2020   HGBA1C 5.5 05/30/2015   Lab Results  Component Value Date   INSULIN 21.4 12/13/2020   Lab Results  Component Value Date   TSH 4.200 12/13/2020   Lab Results  Component Value Date   CHOL 184 12/13/2020   HDL 37 (L) 12/13/2020   LDLCALC 110 (H) 12/13/2020   TRIG 214 (H) 12/13/2020   CHOLHDL 4.5 05/30/2015   Lab Results  Component Value Date   WBC 10.8 12/13/2020   HGB 13.6 12/13/2020   HCT 42.1 12/13/2020   MCV 92 12/13/2020   PLT 331 12/13/2020   No results found for: IRON, TIBC, FERRITIN  Attestation Statements:   Reviewed by clinician on day of visit: allergies, medications, problem list, medical history,  surgical history, family history, social history, and previous encounter notes.  Time spent on visit including pre-visit chart review and post-visit care and charting was 22 minutes.    I, Burt Knack, am acting as transcriptionist for Quillian Quince, MD.  I have reviewed the above documentation for accuracy and completeness, and I agree with the above. -  Quillian Quince, MD

## 2021-03-15 DIAGNOSIS — J301 Allergic rhinitis due to pollen: Secondary | ICD-10-CM | POA: Diagnosis not present

## 2021-03-15 DIAGNOSIS — J3081 Allergic rhinitis due to animal (cat) (dog) hair and dander: Secondary | ICD-10-CM | POA: Diagnosis not present

## 2021-03-15 DIAGNOSIS — J3089 Other allergic rhinitis: Secondary | ICD-10-CM | POA: Diagnosis not present

## 2021-03-16 ENCOUNTER — Other Ambulatory Visit (INDEPENDENT_AMBULATORY_CARE_PROVIDER_SITE_OTHER): Payer: Self-pay | Admitting: Family Medicine

## 2021-03-16 DIAGNOSIS — E8881 Metabolic syndrome: Secondary | ICD-10-CM

## 2021-03-16 DIAGNOSIS — E559 Vitamin D deficiency, unspecified: Secondary | ICD-10-CM

## 2021-03-18 NOTE — Telephone Encounter (Signed)
Dr.Beasley 

## 2021-03-19 ENCOUNTER — Ambulatory Visit (INDEPENDENT_AMBULATORY_CARE_PROVIDER_SITE_OTHER): Payer: BC Managed Care – PPO | Admitting: Family Medicine

## 2021-03-21 ENCOUNTER — Ambulatory Visit (INDEPENDENT_AMBULATORY_CARE_PROVIDER_SITE_OTHER): Payer: BC Managed Care – PPO | Admitting: Family Medicine

## 2021-03-21 ENCOUNTER — Encounter (INDEPENDENT_AMBULATORY_CARE_PROVIDER_SITE_OTHER): Payer: Self-pay | Admitting: Family Medicine

## 2021-03-21 ENCOUNTER — Other Ambulatory Visit: Payer: Self-pay

## 2021-03-21 VITALS — BP 93/64 | HR 72 | Temp 99.3°F | Ht 64.0 in | Wt 275.0 lb

## 2021-03-21 DIAGNOSIS — E559 Vitamin D deficiency, unspecified: Secondary | ICD-10-CM | POA: Insufficient documentation

## 2021-03-21 DIAGNOSIS — Z6841 Body Mass Index (BMI) 40.0 and over, adult: Secondary | ICD-10-CM | POA: Diagnosis not present

## 2021-03-21 DIAGNOSIS — E88819 Insulin resistance, unspecified: Secondary | ICD-10-CM | POA: Insufficient documentation

## 2021-03-21 DIAGNOSIS — E8881 Metabolic syndrome: Secondary | ICD-10-CM

## 2021-03-21 DIAGNOSIS — Z9189 Other specified personal risk factors, not elsewhere classified: Secondary | ICD-10-CM

## 2021-03-21 MED ORDER — METFORMIN HCL 500 MG PO TABS: ORAL_TABLET | ORAL | 0 refills | Status: DC

## 2021-03-21 MED ORDER — VITAMIN D (ERGOCALCIFEROL) 1.25 MG (50000 UNIT) PO CAPS
ORAL_CAPSULE | ORAL | 0 refills | Status: DC
Start: 2021-03-21 — End: 2021-05-02

## 2021-03-22 DIAGNOSIS — J301 Allergic rhinitis due to pollen: Secondary | ICD-10-CM | POA: Diagnosis not present

## 2021-03-22 DIAGNOSIS — J3081 Allergic rhinitis due to animal (cat) (dog) hair and dander: Secondary | ICD-10-CM | POA: Diagnosis not present

## 2021-03-22 DIAGNOSIS — J3089 Other allergic rhinitis: Secondary | ICD-10-CM | POA: Diagnosis not present

## 2021-03-25 ENCOUNTER — Encounter (INDEPENDENT_AMBULATORY_CARE_PROVIDER_SITE_OTHER): Payer: Self-pay | Admitting: Family Medicine

## 2021-03-28 NOTE — Progress Notes (Signed)
Chief Complaint:   OBESITY Sarah Fields is here to discuss her progress with her obesity treatment plan along with follow-up of her obesity related diagnoses.   Today's visit was #: 7 Starting weight: 295 lbs Starting date: 12/13/2020 Today's weight: 275 lbs Today's date: 03/21/2021 Total lbs lost to date: 20 lbs Body mass index is 47.2 kg/m.  Total weight loss percentage to date: -6.78%  Interim History:  Sarah Fields says it was difficult to stay on plan and she wants to have some options and make substitutions for various foods on her Category 3 plan.  Appears to be substituting breakfast, lunch, and dinner foods, but not actually tracking them.  Handouts were given and she was counseled on journaling plans.  She wants to have more control over food choices.  Current Meal Plan: the Category 3 Plan with 450-600 calories and 40 grams of protein at supper daily for 85-90% of the time.  Current Exercise Plan: Resistance bands for 30 minutes 5 times per week.  Assessment/Plan:   1. Insulin resistance Not at goal. Goal is HgbA1c < 5.7, fasting insulin closer to 5.  Medication: metformin 500 mg daily.    Plan:  Will refill metformin today, as per below.  She will continue to focus on protein-rich, low simple carbohydrate foods. We reviewed the importance of hydration, regular exercise for stress reduction, and restorative sleep.   Lab Results  Component Value Date   HGBA1C 5.4 12/13/2020   Lab Results  Component Value Date   INSULIN 21.4 12/13/2020   - Refill metFORMIN (GLUCOPHAGE) 500 MG tablet; TAKE 1 TABLET BY MOUTH EVERY DAY WITH BREAKFAST  Dispense: 30 tablet; Refill: 0  2. Vitamin D deficiency Not at goal. Current vitamin D is 28.4, tested on 12/13/2020. Optimal goal > 50 ng/dL.  She is taking vitamin D 50,000 IU every 3 days.  Plan: Continue to take prescription Vitamin D @50 ,000 IU every 3 days as prescribed.  Follow-up for routine testing of Vitamin D, at least 2-3 times per  year to avoid over-replacement.  - Refill Vitamin D, Ergocalciferol, (DRISDOL) 1.25 MG (50000 UNIT) CAPS capsule; Take 1 capsule by mouth once every 3 days.  Dispense: 10 capsule; Refill: 0  3. At risk for malnutrition Sarah Fields was given extensive malnutrition prevention education and counseling today of more than 9 minutes.  Counseled her that malnutrition refers to inappropriate nutrients or not the right balance of nutrients for optimal health.  Discussed with Sarah Fields that it is absolutely possible to be malnourished but yet obese.  Risk factors, including but not limited to, inappropriate dietary choices, difficulty with obtaining food due to physical or financial limitations, and various physical and mental health conditions were reviewed with Sarah Fields.   4. Obesity, current BMI 47.2  Course: Sarah Fields is currently in the action stage of change. As such, her goal is to continue with weight loss efforts.   Nutrition goals: She has agreed to the Category 3 Plan or journaling 1400-1500 calories and 100+ grams of protein daily..   Exercise goals: As is.  Behavioral modification strategies: decreasing eating out, avoiding temptations, planning for success and keeping a strict food journal.  Sarah Fields has agreed to follow-up with our clinic in 2-3 weeks, with Sarah Fields. She was informed of the importance of frequent follow-up visits to maximize her success with intensive lifestyle modifications for her multiple health conditions.   Sarah Fields was informed we would discuss her lab results at her next visit  unless there is a critical issue that needs to be addressed sooner. Sarah Fields agreed to keep her next visit at the agreed upon time to discuss these results.  Objective:   Blood pressure 93/64, pulse 72, temperature 99.3 F (37.4 C), height 5\' 4"  (1.626 m), weight 275 lb (124.7 kg), SpO2 98 %. Body mass index is 47.2 kg/m.  General: Cooperative, alert, well  developed, in no acute distress. HEENT: Conjunctivae and lids unremarkable. Cardiovascular: Regular rhythm.  Lungs: Normal work of breathing. Neurologic: No focal deficits.   Lab Results  Component Value Date   CREATININE 0.98 12/13/2020   BUN 12 12/13/2020   NA 137 12/13/2020   K 4.9 12/13/2020   CL 101 12/13/2020   CO2 26 12/13/2020   Lab Results  Component Value Date   ALT 13 12/13/2020   AST 16 12/13/2020   ALKPHOS 97 12/13/2020   BILITOT 0.3 12/13/2020   Lab Results  Component Value Date   HGBA1C 5.4 12/13/2020   HGBA1C 5.5 05/30/2015   Lab Results  Component Value Date   INSULIN 21.4 12/13/2020   Lab Results  Component Value Date   TSH 4.200 12/13/2020   Lab Results  Component Value Date   CHOL 184 12/13/2020   HDL 37 (L) 12/13/2020   LDLCALC 110 (H) 12/13/2020   TRIG 214 (H) 12/13/2020   CHOLHDL 4.5 05/30/2015   Lab Results  Component Value Date   WBC 10.8 12/13/2020   HGB 13.6 12/13/2020   HCT 42.1 12/13/2020   MCV 92 12/13/2020   PLT 331 12/13/2020   Attestation Statements:   Reviewed by clinician on day of visit: allergies, medications, problem list, medical history, surgical history, family history, social history, and previous encounter notes.  I, 12/15/2020, CMA, am acting as Insurance claims handler for Energy manager, DO.  I have reviewed the above documentation for accuracy and completeness, and I agree with the above. [employer], D.O.  The 21st Century Cures Act was signed into law in 2016 which includes the topic of electronic health records.  This provides immediate access to information in MyChart.  This includes consultation notes, operative notes, office notes, lab results and pathology reports.  If you have any questions about what you read please let 2017 know at your next visit so we can discuss your concerns and take corrective action if need be.  We are right here with you.

## 2021-03-29 DIAGNOSIS — J3089 Other allergic rhinitis: Secondary | ICD-10-CM | POA: Diagnosis not present

## 2021-03-29 DIAGNOSIS — J3081 Allergic rhinitis due to animal (cat) (dog) hair and dander: Secondary | ICD-10-CM | POA: Diagnosis not present

## 2021-03-29 DIAGNOSIS — J301 Allergic rhinitis due to pollen: Secondary | ICD-10-CM | POA: Diagnosis not present

## 2021-04-03 ENCOUNTER — Other Ambulatory Visit: Payer: Self-pay

## 2021-04-03 ENCOUNTER — Encounter (INDEPENDENT_AMBULATORY_CARE_PROVIDER_SITE_OTHER): Payer: Self-pay | Admitting: Family Medicine

## 2021-04-03 ENCOUNTER — Ambulatory Visit (INDEPENDENT_AMBULATORY_CARE_PROVIDER_SITE_OTHER): Payer: BC Managed Care – PPO | Admitting: Family Medicine

## 2021-04-03 VITALS — BP 119/74 | HR 82 | Temp 98.2°F | Ht 64.0 in | Wt 276.0 lb

## 2021-04-03 DIAGNOSIS — E7849 Other hyperlipidemia: Secondary | ICD-10-CM | POA: Diagnosis not present

## 2021-04-03 DIAGNOSIS — Z6841 Body Mass Index (BMI) 40.0 and over, adult: Secondary | ICD-10-CM | POA: Diagnosis not present

## 2021-04-04 NOTE — Progress Notes (Signed)
Chief Complaint:   OBESITY Sarah Fields is here to discuss her progress with her obesity treatment plan along with follow-up of her obesity related diagnoses. Sarah Fields is on the Category 3 Plan or keeping a food journal and adhering to recommended goals of 1400-1500 calories and 100+ grams of protein daily and states she is following her eating plan approximately 88% of the time. Sarah Fields states she is doing arm bands for 30 minutes 4 times per week.  Today's visit was #: 8 Starting weight: 295 lbs Starting date: 12/13/2020 Today's weight: 276 lbs Today's date: 04/03/2021 Total lbs lost to date: 19 Total lbs lost since last in-office visit: 0  Interim History: Sarah Fields has been traveling more and increasing eating out. She also did some celebration eating over Mother's Day. She tried to change to journaling but she found it very difficult.  Subjective:   1. Other hyperlipidemia Sarah Fields is working on diet, exercise, and weight loss to improve her hyperlipidemia. She is not on a statin, and no chest pain was mentioned.  Assessment/Plan:   1. Other hyperlipidemia Cardiovascular risk and specific lipid/LDL goals reviewed. We discussed several lifestyle modifications today. Sarah Fields will continue to work on diet, exercise and weight loss efforts. We will recheck labs in 2-4 weeks. Orders and follow up as documented in patient record.   2. Obesity with current BMI 47.5 Sarah Fields is currently in the action stage of change. As such, her goal is to continue with weight loss efforts. She has agreed to the Category 3 Plan or keeping a food journal and adhering to recommended goals of 1400-1500 calories and 100+ grams of protein daily.   Strategies to make journaling easier were given as well as higher protein recipes.  Exercise goals: As is.  Behavioral modification strategies: increasing lean protein intake, meal planning and cooking strategies and keeping a strict food journal.  Sarah Fields  has agreed to follow-up with our clinic in 2 weeks. She was informed of the importance of frequent follow-up visits to maximize her success with intensive lifestyle modifications for her multiple health conditions.   Objective:   Blood pressure 119/74, pulse 82, temperature 98.2 F (36.8 C), height 5\' 4"  (1.626 m), weight 276 lb (125.2 kg), SpO2 98 %. Body mass index is 47.38 kg/m.  General: Cooperative, alert, well developed, in no acute distress. HEENT: Conjunctivae and lids unremarkable. Cardiovascular: Regular rhythm.  Lungs: Normal work of breathing. Neurologic: No focal deficits.   Lab Results  Component Value Date   CREATININE 0.98 12/13/2020   BUN 12 12/13/2020   NA 137 12/13/2020   K 4.9 12/13/2020   CL 101 12/13/2020   CO2 26 12/13/2020   Lab Results  Component Value Date   ALT 13 12/13/2020   AST 16 12/13/2020   ALKPHOS 97 12/13/2020   BILITOT 0.3 12/13/2020   Lab Results  Component Value Date   HGBA1C 5.4 12/13/2020   HGBA1C 5.5 05/30/2015   Lab Results  Component Value Date   INSULIN 21.4 12/13/2020   Lab Results  Component Value Date   TSH 4.200 12/13/2020   Lab Results  Component Value Date   CHOL 184 12/13/2020   HDL 37 (L) 12/13/2020   LDLCALC 110 (H) 12/13/2020   TRIG 214 (H) 12/13/2020   CHOLHDL 4.5 05/30/2015   Lab Results  Component Value Date   WBC 10.8 12/13/2020   HGB 13.6 12/13/2020   HCT 42.1 12/13/2020   MCV 92 12/13/2020   PLT 331 12/13/2020  No results found for: IRON, TIBC, FERRITIN  Attestation Statements:   Reviewed by clinician on day of visit: allergies, medications, problem list, medical history, surgical history, family history, social history, and previous encounter notes.  Time spent on visit including pre-visit chart review and post-visit care and charting was 30 minutes.    I, Trixie Dredge, am acting as transcriptionist for Dennard Nip, MD.  I have reviewed the above documentation for accuracy and  completeness, and I agree with the above. -  Dennard Nip, MD

## 2021-04-05 DIAGNOSIS — J301 Allergic rhinitis due to pollen: Secondary | ICD-10-CM | POA: Diagnosis not present

## 2021-04-05 DIAGNOSIS — J3089 Other allergic rhinitis: Secondary | ICD-10-CM | POA: Diagnosis not present

## 2021-04-05 DIAGNOSIS — J3081 Allergic rhinitis due to animal (cat) (dog) hair and dander: Secondary | ICD-10-CM | POA: Diagnosis not present

## 2021-04-12 DIAGNOSIS — J3089 Other allergic rhinitis: Secondary | ICD-10-CM | POA: Diagnosis not present

## 2021-04-12 DIAGNOSIS — J301 Allergic rhinitis due to pollen: Secondary | ICD-10-CM | POA: Diagnosis not present

## 2021-04-12 DIAGNOSIS — J3081 Allergic rhinitis due to animal (cat) (dog) hair and dander: Secondary | ICD-10-CM | POA: Diagnosis not present

## 2021-04-16 ENCOUNTER — Ambulatory Visit (INDEPENDENT_AMBULATORY_CARE_PROVIDER_SITE_OTHER): Payer: BC Managed Care – PPO | Admitting: Family Medicine

## 2021-04-16 ENCOUNTER — Other Ambulatory Visit: Payer: Self-pay

## 2021-04-16 ENCOUNTER — Encounter (INDEPENDENT_AMBULATORY_CARE_PROVIDER_SITE_OTHER): Payer: Self-pay | Admitting: Family Medicine

## 2021-04-16 VITALS — BP 116/70 | HR 71 | Temp 98.2°F | Ht 64.0 in | Wt 272.0 lb

## 2021-04-16 DIAGNOSIS — E559 Vitamin D deficiency, unspecified: Secondary | ICD-10-CM | POA: Diagnosis not present

## 2021-04-16 DIAGNOSIS — Z9189 Other specified personal risk factors, not elsewhere classified: Secondary | ICD-10-CM | POA: Diagnosis not present

## 2021-04-16 DIAGNOSIS — Z6841 Body Mass Index (BMI) 40.0 and over, adult: Secondary | ICD-10-CM | POA: Diagnosis not present

## 2021-04-16 DIAGNOSIS — E8881 Metabolic syndrome: Secondary | ICD-10-CM

## 2021-04-16 MED ORDER — METFORMIN HCL 500 MG PO TABS
ORAL_TABLET | ORAL | 0 refills | Status: DC
Start: 2021-04-16 — End: 2021-05-22

## 2021-04-17 NOTE — Progress Notes (Signed)
Chief Complaint:   OBESITY Sarah Fields is here to discuss her progress with her obesity treatment plan along with follow-up of her obesity related diagnoses. Sarah Fields is on the Category 3 Plan or keeping a food journal and adhering to recommended goals of 1400-1500 calories and 100+ grams of protein daily and states she is following her eating plan approximately 90-95% of the time. Sarah Fields states she is exercising with arm bands for 30 minutes 4 times per week.  Today's visit was #: 9 Starting weight: 295 lbs Starting date: 12/13/2020 Today's weight: 273 lbs Today's date: 04/16/2021 Total lbs lost to date: 23 Total lbs lost since last in-office visit: 4  Interim History: Sarah Fields has done well getting back on track with her weight loss. She is doing better with meal planning and prepping. She feels her hunger is controlled and she is doing better getting into the habit of exercise.  Subjective:   1. Insulin resistance Sarah Fields continues to do well with weight loss. She is stable on metformin and she notes decreased polyphagia.  2. Vitamin D deficiency Sarah Fields is stable on Vit D, and she denies signs of over-replacement.  3. At risk for activity intolerance Sarah Fields is at risk for exercise intolerance due to knee pain.  Assessment/Plan:   1. Insulin resistance Sarah Fields will continue to work on weight loss, exercise, and decreasing simple carbohydrates to help decrease the risk of diabetes. We will refill metformin for 1 month, and we will recheck labs in 4-6 weeks. Sarah Fields agreed to follow-up with [place] as directed to closely monitor her progress.  - metFORMIN (GLUCOPHAGE) 500 MG tablet; TAKE 1 TABLET BY MOUTH EVERY DAY WITH BREAKFAST  Dispense: 30 tablet; Refill: 0  2. Vitamin D deficiency Low Vitamin D level contributes to fatigue and are associated with obesity, breast, and colon cancer. Sarah Fields will continue prescription Vitamin D 50,000 IU once every 3 days and we will plan  to recheck labs in 4-6 weeks. She will follow-up for routine testing of Vitamin D, at least 2-3 times per year to avoid over-replacement.  3. At risk for activity intolerance Sarah Fields was given approximately 15 minutes of exercise intolerance counseling today. She is 45 y.o. female and has risk factors exercise intolerance including obesity. We discussed intensive lifestyle modifications today with an emphasis on specific weight loss instructions and strategies. Sarah Fields will slowly increase activity as tolerated.  Repetitive spaced learning was employed today to elicit superior memory formation and behavioral change.  4. Obesity with current BMI 46.8 Sarah Fields is currently in the action stage of change. As such, her goal is to continue with weight loss efforts. She has agreed to the Category 3 Plan or keeping a food journal and adhering to recommended goals of 1400-1500 calories and 100+ grams of protein daily.   Exercise goals: As is.  Behavioral modification strategies: increasing lean protein intake and planning for success.  Sarah Fields has agreed to follow-up with our clinic in 4 to 5 weeks. She was informed of the importance of frequent follow-up visits to maximize her success with intensive lifestyle modifications for her multiple health conditions.   Objective:   Blood pressure 116/70, pulse 71, temperature 98.2 F (36.8 C), height 5\' 4"  (1.626 m), weight 272 lb (123.4 kg), SpO2 99 %. Body mass index is 46.69 kg/m.  General: Cooperative, alert, well developed, in no acute distress. HEENT: Conjunctivae and lids unremarkable. Cardiovascular: Regular rhythm.  Lungs: Normal work of breathing. Neurologic: No focal deficits.   Lab Results  Component Value Date   CREATININE 0.98 12/13/2020   BUN 12 12/13/2020   NA 137 12/13/2020   K 4.9 12/13/2020   CL 101 12/13/2020   CO2 26 12/13/2020   Lab Results  Component Value Date   ALT 13 12/13/2020   AST 16 12/13/2020   ALKPHOS 97  12/13/2020   BILITOT 0.3 12/13/2020   Lab Results  Component Value Date   HGBA1C 5.4 12/13/2020   HGBA1C 5.5 05/30/2015   Lab Results  Component Value Date   INSULIN 21.4 12/13/2020   Lab Results  Component Value Date   TSH 4.200 12/13/2020   Lab Results  Component Value Date   CHOL 184 12/13/2020   HDL 37 (L) 12/13/2020   LDLCALC 110 (H) 12/13/2020   TRIG 214 (H) 12/13/2020   CHOLHDL 4.5 05/30/2015   Lab Results  Component Value Date   WBC 10.8 12/13/2020   HGB 13.6 12/13/2020   HCT 42.1 12/13/2020   MCV 92 12/13/2020   PLT 331 12/13/2020   No results found for: IRON, TIBC, FERRITIN  Attestation Statements:   Reviewed by clinician on day of visit: allergies, medications, problem list, medical history, surgical history, family history, social history, and previous encounter notes.   I, Burt Knack, am acting as transcriptionist for Quillian Quince, MD.  I have reviewed the above documentation for accuracy and completeness, and I agree with the above. -  Quillian Quince, MD

## 2021-04-18 ENCOUNTER — Other Ambulatory Visit (INDEPENDENT_AMBULATORY_CARE_PROVIDER_SITE_OTHER): Payer: Self-pay | Admitting: Family Medicine

## 2021-04-18 DIAGNOSIS — J3081 Allergic rhinitis due to animal (cat) (dog) hair and dander: Secondary | ICD-10-CM | POA: Diagnosis not present

## 2021-04-18 DIAGNOSIS — J3089 Other allergic rhinitis: Secondary | ICD-10-CM | POA: Diagnosis not present

## 2021-04-18 DIAGNOSIS — E8881 Metabolic syndrome: Secondary | ICD-10-CM

## 2021-04-18 DIAGNOSIS — E559 Vitamin D deficiency, unspecified: Secondary | ICD-10-CM

## 2021-04-18 DIAGNOSIS — J301 Allergic rhinitis due to pollen: Secondary | ICD-10-CM | POA: Diagnosis not present

## 2021-04-18 NOTE — Telephone Encounter (Signed)
DR Beasley 

## 2021-04-24 DIAGNOSIS — Z8616 Personal history of COVID-19: Secondary | ICD-10-CM

## 2021-04-24 HISTORY — DX: Personal history of COVID-19: Z86.16

## 2021-04-25 DIAGNOSIS — J3081 Allergic rhinitis due to animal (cat) (dog) hair and dander: Secondary | ICD-10-CM | POA: Diagnosis not present

## 2021-04-25 DIAGNOSIS — J3089 Other allergic rhinitis: Secondary | ICD-10-CM | POA: Diagnosis not present

## 2021-04-25 DIAGNOSIS — J301 Allergic rhinitis due to pollen: Secondary | ICD-10-CM | POA: Diagnosis not present

## 2021-05-02 ENCOUNTER — Encounter (INDEPENDENT_AMBULATORY_CARE_PROVIDER_SITE_OTHER): Payer: Self-pay | Admitting: Family Medicine

## 2021-05-02 ENCOUNTER — Ambulatory Visit (INDEPENDENT_AMBULATORY_CARE_PROVIDER_SITE_OTHER): Payer: BC Managed Care – PPO | Admitting: Family Medicine

## 2021-05-02 ENCOUNTER — Other Ambulatory Visit: Payer: Self-pay

## 2021-05-02 VITALS — BP 128/85 | HR 90 | Temp 98.2°F | Ht 64.0 in | Wt 271.0 lb

## 2021-05-02 DIAGNOSIS — E559 Vitamin D deficiency, unspecified: Secondary | ICD-10-CM | POA: Diagnosis not present

## 2021-05-02 DIAGNOSIS — J3089 Other allergic rhinitis: Secondary | ICD-10-CM | POA: Diagnosis not present

## 2021-05-02 DIAGNOSIS — Z6841 Body Mass Index (BMI) 40.0 and over, adult: Secondary | ICD-10-CM | POA: Diagnosis not present

## 2021-05-02 DIAGNOSIS — J301 Allergic rhinitis due to pollen: Secondary | ICD-10-CM | POA: Diagnosis not present

## 2021-05-02 DIAGNOSIS — J3081 Allergic rhinitis due to animal (cat) (dog) hair and dander: Secondary | ICD-10-CM | POA: Diagnosis not present

## 2021-05-02 MED ORDER — VITAMIN D (ERGOCALCIFEROL) 1.25 MG (50000 UNIT) PO CAPS: ORAL_CAPSULE | ORAL | 0 refills | Status: DC

## 2021-05-09 NOTE — Progress Notes (Signed)
Chief Complaint:   OBESITY Sarah Fields is here to discuss her progress with her obesity treatment plan along with follow-up of her obesity related diagnoses. [name] is on the Category 3 Plan and states she is following her eating plan approximately 90% of the time. [name] states she is walking steps for 30 minutes 4 times per week.  Today's visit was #: 10 Starting weight: 295 lbs Starting date: 12/13/2020 Today's weight: 271 lbs Today's date: 05/02/2021 Total lbs lost to date: 24 Total lbs lost since last in-office visit: 1  Interim History: Sarah Fields continues to do well with weight loss even on vacation. She is following the Category 3 most of the time. She did more walking and stairs while on vacation.  Subjective:   1. Vitamin D deficiency Sarah Fields is stable on Vit D, and she des nausea, vomiting, or muscle weakness. She requests a refill today.  Assessment/Plan:   1. Vitamin D deficiency Low Vitamin D level contributes to fatigue and are associated with obesity, breast, and colon cancer. We will refill prescription Vitamin D for 1 month. Sarah Fields will follow-up for routine testing of Vitamin D, at least 2-3 times per year to avoid over-replacement.  - Vitamin D, Ergocalciferol, (DRISDOL) 1.25 MG (50000 UNIT) CAPS capsule; Take 1 capsule by mouth once every 3 days.  Dispense: 10 capsule; Refill: 0  2. Obesity with current BMI 46.5 Sarah Fields is currently in the action stage of change. As such, her goal is to continue with weight loss efforts. She has agreed to the Category 3 Plan.   Exercise goals: As is, add core strengthening.  Behavioral modification strategies: increasing lean protein intake, increasing vegetables, and better snacking choices.  Sarah Fields has agreed to follow-up with our clinic in 2 to 3 weeks. She was informed of the importance of frequent follow-up visits to maximize her success with intensive lifestyle modifications for her multiple health conditions.    Objective:   Blood pressure 128/85, pulse 90, temperature 98.2 F (36.8 C), height 5\' 4"  (1.626 m), weight 271 lb (122.9 kg), SpO2 98 %. Body mass index is 46.52 kg/m.  General: Cooperative, alert, well developed, in no acute distress. HEENT: Conjunctivae and lids unremarkable. Cardiovascular: Regular rhythm.  Lungs: Normal work of breathing. Neurologic: No focal deficits.   Lab Results  Component Value Date   CREATININE 0.98 12/13/2020   BUN 12 12/13/2020   NA 137 12/13/2020   K 4.9 12/13/2020   CL 101 12/13/2020   CO2 26 12/13/2020   Lab Results  Component Value Date   ALT 13 12/13/2020   AST 16 12/13/2020   ALKPHOS 97 12/13/2020   BILITOT 0.3 12/13/2020   Lab Results  Component Value Date   HGBA1C 5.4 12/13/2020   HGBA1C 5.5 05/30/2015   Lab Results  Component Value Date   INSULIN 21.4 12/13/2020   Lab Results  Component Value Date   TSH 4.200 12/13/2020   Lab Results  Component Value Date   CHOL 184 12/13/2020   HDL 37 (L) 12/13/2020   LDLCALC 110 (H) 12/13/2020   TRIG 214 (H) 12/13/2020   CHOLHDL 4.5 05/30/2015   Lab Results  Component Value Date   WBC 10.8 12/13/2020   HGB 13.6 12/13/2020   HCT 42.1 12/13/2020   MCV 92 12/13/2020   PLT 331 12/13/2020   No results found for: IRON, TIBC, FERRITIN  Attestation Statements:   Reviewed by clinician on day of visit: allergies, medications, problem list, medical history, surgical history, family  history, social history, and previous encounter notes.  Time spent on visit including pre-visit chart review and post-visit care and charting was 32 minutes.    I, Burt Knack, am acting as transcriptionist for Quillian Quince, MD.  I have reviewed the above documentation for accuracy and completeness, and I agree with the above. -  Quillian Quince, MD

## 2021-05-10 DIAGNOSIS — J3089 Other allergic rhinitis: Secondary | ICD-10-CM | POA: Diagnosis not present

## 2021-05-10 DIAGNOSIS — J301 Allergic rhinitis due to pollen: Secondary | ICD-10-CM | POA: Diagnosis not present

## 2021-05-10 DIAGNOSIS — J3081 Allergic rhinitis due to animal (cat) (dog) hair and dander: Secondary | ICD-10-CM | POA: Diagnosis not present

## 2021-05-16 ENCOUNTER — Other Ambulatory Visit (INDEPENDENT_AMBULATORY_CARE_PROVIDER_SITE_OTHER): Payer: Self-pay | Admitting: Family Medicine

## 2021-05-16 DIAGNOSIS — E8881 Metabolic syndrome: Secondary | ICD-10-CM

## 2021-05-16 NOTE — Telephone Encounter (Signed)
Last OV with Dr. Beasley 

## 2021-05-20 ENCOUNTER — Ambulatory Visit (INDEPENDENT_AMBULATORY_CARE_PROVIDER_SITE_OTHER): Payer: BC Managed Care – PPO | Admitting: Physician Assistant

## 2021-05-22 NOTE — Telephone Encounter (Signed)
Patient is requesting a refill of the following medications: Requested Prescriptions   Pending Prescriptions Disp Refills   metFORMIN (GLUCOPHAGE) 500 MG tablet [Pharmacy Med Name: METFORMIN HCL 500 MG TABLET] 30 tablet 0    Sig: TAKE 1 TABLET BY MOUTH EVERY DAY WITH BREAKFAST    Last office visit: 05/02/21 Date of last refill: 04/16/21 Last refill amount: 30 Follow up time period per chart: 3 week Next appt:06/05/21

## 2021-05-27 ENCOUNTER — Other Ambulatory Visit (INDEPENDENT_AMBULATORY_CARE_PROVIDER_SITE_OTHER): Payer: Self-pay | Admitting: Family Medicine

## 2021-05-27 DIAGNOSIS — E559 Vitamin D deficiency, unspecified: Secondary | ICD-10-CM

## 2021-05-28 ENCOUNTER — Encounter (INDEPENDENT_AMBULATORY_CARE_PROVIDER_SITE_OTHER): Payer: Self-pay

## 2021-05-28 NOTE — Telephone Encounter (Signed)
Message sent to pt-[facility] 

## 2021-05-30 ENCOUNTER — Encounter (INDEPENDENT_AMBULATORY_CARE_PROVIDER_SITE_OTHER): Payer: Self-pay | Admitting: Emergency Medicine

## 2021-05-30 ENCOUNTER — Telehealth (INDEPENDENT_AMBULATORY_CARE_PROVIDER_SITE_OTHER): Payer: Self-pay | Admitting: Emergency Medicine

## 2021-05-30 NOTE — Telephone Encounter (Signed)
Msg has been sent to patient. We will get labs at her office visit

## 2021-05-30 NOTE — Telephone Encounter (Signed)
Because these labs are not for a physical, we need to order tham at the visit.

## 2021-05-30 NOTE — Telephone Encounter (Signed)
Dr Dalbert Garnet patient would like to come into the office Monday morning 06/03/21 for lab work. Patient has an office visit on 713/22 @ 3:40. Please advise if we can put in orders for lab for  Monday or If patient just need to have labs at her office visit on 06/05/21. Only thing I see mentioned on patient clinical sheet was having Vit D rechecked.

## 2021-06-05 ENCOUNTER — Other Ambulatory Visit: Payer: Self-pay

## 2021-06-05 ENCOUNTER — Encounter (INDEPENDENT_AMBULATORY_CARE_PROVIDER_SITE_OTHER): Payer: Self-pay | Admitting: Family Medicine

## 2021-06-05 ENCOUNTER — Ambulatory Visit (INDEPENDENT_AMBULATORY_CARE_PROVIDER_SITE_OTHER): Payer: 59 | Admitting: Family Medicine

## 2021-06-05 VITALS — BP 120/78 | HR 99 | Temp 98.4°F | Ht 64.0 in | Wt 265.0 lb

## 2021-06-05 DIAGNOSIS — E559 Vitamin D deficiency, unspecified: Secondary | ICD-10-CM | POA: Diagnosis not present

## 2021-06-05 DIAGNOSIS — Z6841 Body Mass Index (BMI) 40.0 and over, adult: Secondary | ICD-10-CM | POA: Diagnosis not present

## 2021-06-05 NOTE — Telephone Encounter (Signed)
Not needed

## 2021-06-12 NOTE — Progress Notes (Signed)
Chief Complaint:   OBESITY Sarah Fields is here to discuss her progress with her obesity treatment plan along with follow-up of her obesity related diagnoses. Sarah Fields is on the Category 3 Plan and states she is following her eating plan approximately (unknown)% of the time. Sarah Fields states she is doing arm exercises for 5 minutes 2 times per week.  Today's visit was #: 11 Starting weight: 295 lbs Starting date: 12/13/2020 Today's weight: 265 lbs Today's date: 06/05/2021 Total lbs lost to date: 30 Total lbs lost since last in-office visit: 6  Interim History: Sarah Fields has been recovering from a COVID infection, and she hasn't been able to follow her plan. Her appetite has been low and she hasn't eaten much, so she has lost weight but this is likely decreasing her RMR. She notes breakfast is especially difficult for her to eat.  Subjective:   1. Vitamin D deficiency Sarah Fields is stable on Vit D, and she denies nausea, vomiting, or muscle weakness. She is due for labs soon.  Assessment/Plan:   1. Vitamin D deficiency Low Vitamin D level contributes to fatigue and are associated with obesity, breast, and colon cancer. Sarah Fields will continue taking prescription Vitamin D 50,000 IU every 3 days and will follow-up for routine testing of Vitamin D, at least 2-3 times per year to avoid over-replacement.  2. Obesity with current BMI 45.6 Sarah Fields is currently in the action stage of change. As such, her goal is to continue with weight loss efforts. She has agreed to the Category 3 Plan.   Whole food breakfast smoothie recipes was given  for now.  Exercise goals: As is.  Behavioral modification strategies: increasing lean protein intake and meal planning and cooking strategies.  Sarah Fields has agreed to follow-up with our clinic in 2 to 3 weeks. She was informed of the importance of frequent follow-up visits to maximize her success with intensive lifestyle modifications for her multiple health  conditions.   Objective:   Blood pressure 120/78, pulse 99, temperature 98.4 F (36.9 C), height 5\' 4"  (1.626 m), weight 265 lb (120.2 kg), SpO2 99 %. Body mass index is 45.49 kg/m.  General: Cooperative, alert, well developed, in no acute distress. HEENT: Conjunctivae and lids unremarkable. Cardiovascular: Regular rhythm.  Lungs: Normal work of breathing. Neurologic: No focal deficits.   Lab Results  Component Value Date   CREATININE 0.98 12/13/2020   BUN 12 12/13/2020   NA 137 12/13/2020   K 4.9 12/13/2020   CL 101 12/13/2020   CO2 26 12/13/2020   Lab Results  Component Value Date   ALT 13 12/13/2020   AST 16 12/13/2020   ALKPHOS 97 12/13/2020   BILITOT 0.3 12/13/2020   Lab Results  Component Value Date   HGBA1C 5.4 12/13/2020   HGBA1C 5.5 05/30/2015   Lab Results  Component Value Date   INSULIN 21.4 12/13/2020   Lab Results  Component Value Date   TSH 4.200 12/13/2020   Lab Results  Component Value Date   CHOL 184 12/13/2020   HDL 37 (L) 12/13/2020   LDLCALC 110 (H) 12/13/2020   TRIG 214 (H) 12/13/2020   CHOLHDL 4.5 05/30/2015   Lab Results  Component Value Date   VD25OH 28.4 (L) 12/13/2020   Lab Results  Component Value Date   WBC 10.8 12/13/2020   HGB 13.6 12/13/2020   HCT 42.1 12/13/2020   MCV 92 12/13/2020   PLT 331 12/13/2020   No results found for: IRON, TIBC, FERRITIN  Attestation  Statements:   Reviewed by clinician on day of visit: allergies, medications, problem list, medical history, surgical history, family history, social history, and previous encounter notes.  Time spent on visit including pre-visit chart review and post-visit care and charting was 30 minutes.    I, Burt Knack, am acting as transcriptionist for Quillian Quince, MD.  I have reviewed the above documentation for accuracy and completeness, and I agree with the above. -  Quillian Quince, MD

## 2021-06-20 ENCOUNTER — Encounter (INDEPENDENT_AMBULATORY_CARE_PROVIDER_SITE_OTHER): Payer: Self-pay

## 2021-06-23 ENCOUNTER — Other Ambulatory Visit (INDEPENDENT_AMBULATORY_CARE_PROVIDER_SITE_OTHER): Payer: Self-pay | Admitting: Family Medicine

## 2021-06-23 DIAGNOSIS — E8881 Metabolic syndrome: Secondary | ICD-10-CM

## 2021-06-23 DIAGNOSIS — E559 Vitamin D deficiency, unspecified: Secondary | ICD-10-CM

## 2021-06-24 NOTE — Telephone Encounter (Signed)
Pt last seen by Dr. Beasley.  

## 2021-06-25 ENCOUNTER — Other Ambulatory Visit: Payer: Self-pay

## 2021-06-25 ENCOUNTER — Ambulatory Visit (INDEPENDENT_AMBULATORY_CARE_PROVIDER_SITE_OTHER): Payer: 59 | Admitting: Family Medicine

## 2021-06-25 ENCOUNTER — Encounter (INDEPENDENT_AMBULATORY_CARE_PROVIDER_SITE_OTHER): Payer: Self-pay | Admitting: Family Medicine

## 2021-06-25 VITALS — BP 130/85 | HR 72 | Temp 98.8°F | Ht 64.0 in | Wt 266.0 lb

## 2021-06-25 DIAGNOSIS — F3289 Other specified depressive episodes: Secondary | ICD-10-CM

## 2021-06-25 DIAGNOSIS — Z9189 Other specified personal risk factors, not elsewhere classified: Secondary | ICD-10-CM | POA: Diagnosis not present

## 2021-06-25 DIAGNOSIS — E559 Vitamin D deficiency, unspecified: Secondary | ICD-10-CM

## 2021-06-25 DIAGNOSIS — Z6841 Body Mass Index (BMI) 40.0 and over, adult: Secondary | ICD-10-CM | POA: Diagnosis not present

## 2021-06-25 DIAGNOSIS — E8881 Metabolic syndrome: Secondary | ICD-10-CM

## 2021-06-25 MED ORDER — BUPROPION HCL ER (SR) 150 MG PO TB12: 150.0000 mg | ORAL_TABLET | Freq: Every morning | ORAL | 0 refills | Status: DC

## 2021-06-25 MED ORDER — METFORMIN HCL 500 MG PO TABS: ORAL_TABLET | ORAL | 0 refills | Status: DC

## 2021-06-27 NOTE — Progress Notes (Signed)
Chief Complaint:   OBESITY Sarah Fields is here to discuss her progress with her obesity treatment plan along with follow-up of her obesity related diagnoses. Sarah Fields is on the Category 3 Plan and states she is following her eating plan approximately 90% of the time. Sarah Fields states she is doing arm exercises for 5 minutes 5 times per week.  Today's visit was #: 12 Starting weight: 295 lbs Starting date: 12/13/2020 Today's weight: 266 lbs Today's date: 06/25/2021 Total lbs lost to date: 29 Total lbs lost since last in-office visit: 0  Interim History: Sarah Fields continues to work on eating healthier and [employer]. She notes some increased stress and stress eating.  Subjective:   1. Insulin resistance Sarah Fields is stable on metformin, and she denies nausea, vomiting, or muscle weakness.  2. Other depression with emotional eating Sarah Fields notes some increased carbohydrate cravings, which is unusual for her. She notes increased work stress and this may be contributing.  3. At risk for dehydration Sarah Fields is at risk for dehydration due to inadequate water intake.  Assessment/Plan:   1. Insulin resistance [name] will continue to work on weight loss, exercise, and decreasing simple carbohydrates to help decrease the risk of diabetes. We will refill metformin for 1 month. Sarah Fields agreed to follow-up with [place] as directed to closely monitor her progress.  - metFORMIN (GLUCOPHAGE) 500 MG tablet; TAKE 1 TABLET BY MOUTH EVERY DAY WITH BREAKFAST  Dispense: 30 tablet; Refill: 0  2. Other depression with emotional eating Behavior modification techniques were discussed today to help Sarah Fields deal with her emotional/non-hunger eating behaviors. Sarah Fields agreed to start Wellbutrin SR 150 mg q AM with no refills. We will follow up in 2 weeks. Orders and follow up as documented in patient record.   - buPROPion (WELLBUTRIN SR) 150 MG 12 hr tablet; Take 1 tablet (150 mg total) by mouth  in the morning.  Dispense: 30 tablet; Refill: 0  3. At risk for dehydration Sarah Fields was given approximately 15 minutes dehydration prevention counseling today. Sarah Fields is at risk for dehydration due to weight loss and current medication(s). She was encouraged to hydrate and monitor fluid status to avoid dehydration as well as weight loss plateaus.   4. Obesity with current BMI 45.7 Sarah Fields is currently in the action stage of change. As such, her goal is to continue with weight loss efforts. She has agreed to the Category 3 Plan.   Exercise goals: As is.  Behavioral modification strategies: increasing lean protein intake, increasing water intake, and emotional eating strategies.  Sarah Fields has agreed to follow-up with our clinic in 2 weeks. She was informed of the importance of frequent follow-up visits to maximize her success with intensive lifestyle modifications for her multiple health conditions.   Objective:   Blood pressure 130/85, pulse 72, temperature 98.8 F (37.1 C), height 5\' 4"  (1.626 m), weight 266 lb (120.7 kg), SpO2 99 %. Body mass index is 45.66 kg/m.  General: Cooperative, alert, well developed, in no acute distress. HEENT: Conjunctivae and lids unremarkable. Cardiovascular: Regular rhythm.  Lungs: Normal work of breathing. Neurologic: No focal deficits.   Lab Results  Component Value Date   CREATININE 0.98 12/13/2020   BUN 12 12/13/2020   NA 137 12/13/2020   K 4.9 12/13/2020   CL 101 12/13/2020   CO2 26 12/13/2020   Lab Results  Component Value Date   ALT 13 12/13/2020   AST 16 12/13/2020   ALKPHOS 97 12/13/2020   BILITOT 0.3 12/13/2020  Lab Results  Component Value Date   HGBA1C 5.4 12/13/2020   HGBA1C 5.5 05/30/2015   Lab Results  Component Value Date   INSULIN 21.4 12/13/2020   Lab Results  Component Value Date   TSH 4.200 12/13/2020   Lab Results  Component Value Date   CHOL 184 12/13/2020   HDL 37 (L) 12/13/2020   LDLCALC 110 (H)  12/13/2020   TRIG 214 (H) 12/13/2020   CHOLHDL 4.5 05/30/2015   Lab Results  Component Value Date   VD25OH 28.4 (L) 12/13/2020   Lab Results  Component Value Date   WBC 10.8 12/13/2020   HGB 13.6 12/13/2020   HCT 42.1 12/13/2020   MCV 92 12/13/2020   PLT 331 12/13/2020   No results found for: IRON, TIBC, FERRITIN  Attestation Statements:   Reviewed by clinician on day of visit: allergies, medications, problem list, medical history, surgical history, family history, social history, and previous encounter notes.   I, Burt Knack, am acting as transcriptionist for Quillian Quince, MD.  I have reviewed the above documentation for accuracy and completeness, and I agree with the above. -  Quillian Quince, MD

## 2021-07-05 ENCOUNTER — Ambulatory Visit: Payer: Self-pay | Admitting: Nurse Practitioner

## 2021-07-10 ENCOUNTER — Ambulatory Visit (INDEPENDENT_AMBULATORY_CARE_PROVIDER_SITE_OTHER): Payer: 59 | Admitting: Family Medicine

## 2021-07-10 ENCOUNTER — Encounter (INDEPENDENT_AMBULATORY_CARE_PROVIDER_SITE_OTHER): Payer: Self-pay | Admitting: Family Medicine

## 2021-07-10 ENCOUNTER — Other Ambulatory Visit: Payer: Self-pay

## 2021-07-10 VITALS — BP 130/83 | HR 87 | Temp 98.8°F | Ht 64.0 in | Wt 264.0 lb

## 2021-07-10 DIAGNOSIS — Z6841 Body Mass Index (BMI) 40.0 and over, adult: Secondary | ICD-10-CM | POA: Diagnosis not present

## 2021-07-10 DIAGNOSIS — R7303 Prediabetes: Secondary | ICD-10-CM | POA: Diagnosis not present

## 2021-07-10 DIAGNOSIS — E559 Vitamin D deficiency, unspecified: Secondary | ICD-10-CM | POA: Diagnosis not present

## 2021-07-10 DIAGNOSIS — Z9189 Other specified personal risk factors, not elsewhere classified: Secondary | ICD-10-CM | POA: Diagnosis not present

## 2021-07-10 MED ORDER — VITAMIN D (ERGOCALCIFEROL) 1.25 MG (50000 UNIT) PO CAPS: ORAL_CAPSULE | ORAL | 0 refills | Status: DC

## 2021-07-10 MED ORDER — METFORMIN HCL 500 MG PO TABS: ORAL_TABLET | ORAL | 0 refills | Status: DC

## 2021-07-11 NOTE — Progress Notes (Signed)
Chief Complaint:   OBESITY Sarah Fields is here to discuss her progress with her obesity treatment plan along with follow-up of her obesity related diagnoses. Sarah Fields is on the Category 3 Plan and states she is following her eating plan approximately 90% of the time. Sarah Fields states she is doing 0 minutes 0 times per week.  Today's visit was #: 13 Starting weight: 295 lbs Starting date: 12/13/2020 Today's weight: 264 lbs Today's date: 07/10/2021 Total lbs lost to date: 31 Total lbs lost since last in-office visit: 2  Interim History: Sarah Fields continues to do well with weight loss. She is struggling to stay on track at this, and she is getting a bit bored with her dinner options.  Subjective:   1. Pre-diabetes Sarah Fields is stable on metformin, and she denies nausea, vomiting, or hypoglycemia. She is doing well with diet and weight loss.  2. Vitamin D deficiency Sarah Fields's Vit D level is stable, and she denies nausea, vomiting, or muscle weakness.  3. At risk for impaired metabolic function Sarah Fields is at increased risk for impaired metabolic function due to current nutrition and muscle mass.  Assessment/Plan:   1. Pre-diabetes Sarah Fields will continue to work on weight loss, exercise, and decreasing simple carbohydrates to help decrease the risk of diabetes. We will refill metformin for 1 month.  - metFORMIN (GLUCOPHAGE) 500 MG tablet; TAKE 1 TABLET BY MOUTH EVERY DAY WITH BREAKFAST  Dispense: 30 tablet; Refill: 0  2. Vitamin D deficiency Low Vitamin D level contributes to fatigue and are associated with obesity, breast, and colon cancer. We will refill prescription Vitamin D for 1 month. Sarah Fields will follow-up for routine testing of Vitamin D, at least 2-3 times per year to avoid over-replacement.  - Vitamin D, Ergocalciferol, (DRISDOL) 1.25 MG (50000 UNIT) CAPS capsule; TAKE 1 CAPSULE BY MOUTH ONCE EVERY 3 DAYS.  Dispense: 10 capsule; Refill: 0  3. At risk for impaired  metabolic function Sarah Fields was given approximately 15 minutes of impaired  metabolic function prevention counseling today. We discussed intensive lifestyle modifications today with an emphasis on specific nutrition and exercise instructions and strategies.   Repetitive spaced learning was employed today to elicit superior memory formation and behavioral change.  4. Obesity with current BMI 45.4 Sarah Fields is currently in the action stage of change. As such, her goal is to continue with weight loss efforts. She has agreed to the Category 3 Plan and keeping a food journal and adhering to recommended goals of 450-600 calories and 40+ grams of protein at supper daily.   Behavioral modification strategies: increasing lean protein intake.  Sarah Fields has agreed to follow-up with our clinic in 2 to 3 weeks. She was informed of the importance of frequent follow-up visits to maximize her success with intensive lifestyle modifications for her multiple health conditions.   Objective:   Blood pressure 130/83, pulse 87, temperature 98.8 F (37.1 C), height 5\' 4"  (1.626 m), weight 264 lb (119.7 kg), SpO2 96 %. Body mass index is 45.32 kg/m.  General: Cooperative, alert, well developed, in no acute distress. HEENT: Conjunctivae and lids unremarkable. Cardiovascular: Regular rhythm.  Lungs: Normal work of breathing. Neurologic: No focal deficits.   Lab Results  Component Value Date   CREATININE 0.98 12/13/2020   BUN 12 12/13/2020   NA 137 12/13/2020   K 4.9 12/13/2020   CL 101 12/13/2020   CO2 26 12/13/2020   Lab Results  Component Value Date   ALT 13 12/13/2020   AST 16 12/13/2020  ALKPHOS 97 12/13/2020   BILITOT 0.3 12/13/2020   Lab Results  Component Value Date   HGBA1C 5.4 12/13/2020   HGBA1C 5.5 05/30/2015   Lab Results  Component Value Date   INSULIN 21.4 12/13/2020   Lab Results  Component Value Date   TSH 4.200 12/13/2020   Lab Results  Component Value Date   CHOL 184  12/13/2020   HDL 37 (L) 12/13/2020   LDLCALC 110 (H) 12/13/2020   TRIG 214 (H) 12/13/2020   CHOLHDL 4.5 05/30/2015   Lab Results  Component Value Date   VD25OH 28.4 (L) 12/13/2020   Lab Results  Component Value Date   WBC 10.8 12/13/2020   HGB 13.6 12/13/2020   HCT 42.1 12/13/2020   MCV 92 12/13/2020   PLT 331 12/13/2020   No results found for: IRON, TIBC, FERRITIN  Attestation Statements:   Reviewed by clinician on day of visit: allergies, medications, problem list, medical history, surgical history, family history, social history, and previous encounter notes.   I, Burt Knack, am acting as transcriptionist for Quillian Quince, MD.  I have reviewed the above documentation for accuracy and completeness, and I agree with the above. -  Quillian Quince, MD

## 2021-07-22 ENCOUNTER — Other Ambulatory Visit (INDEPENDENT_AMBULATORY_CARE_PROVIDER_SITE_OTHER): Payer: Self-pay | Admitting: Family Medicine

## 2021-07-22 DIAGNOSIS — F3289 Other specified depressive episodes: Secondary | ICD-10-CM

## 2021-07-22 DIAGNOSIS — R7303 Prediabetes: Secondary | ICD-10-CM

## 2021-07-22 NOTE — Telephone Encounter (Signed)
Last Ov with Dr Beasley

## 2021-07-22 NOTE — Telephone Encounter (Signed)
Last OV with Dr. Beasley 

## 2021-07-23 ENCOUNTER — Encounter (INDEPENDENT_AMBULATORY_CARE_PROVIDER_SITE_OTHER): Payer: Self-pay

## 2021-07-23 ENCOUNTER — Ambulatory Visit (INDEPENDENT_AMBULATORY_CARE_PROVIDER_SITE_OTHER): Payer: 59 | Admitting: Nurse Practitioner

## 2021-07-23 ENCOUNTER — Encounter: Payer: Self-pay | Admitting: Nurse Practitioner

## 2021-07-23 ENCOUNTER — Other Ambulatory Visit: Payer: Self-pay

## 2021-07-23 ENCOUNTER — Ambulatory Visit (INDEPENDENT_AMBULATORY_CARE_PROVIDER_SITE_OTHER): Payer: 59 | Admitting: Family Medicine

## 2021-07-23 VITALS — BP 128/90 | HR 81 | Temp 97.2°F | Ht 64.0 in | Wt 266.3 lb

## 2021-07-23 DIAGNOSIS — Z7689 Persons encountering health services in other specified circumstances: Secondary | ICD-10-CM

## 2021-07-23 DIAGNOSIS — Z6841 Body Mass Index (BMI) 40.0 and over, adult: Secondary | ICD-10-CM

## 2021-07-23 DIAGNOSIS — G43119 Migraine with aura, intractable, without status migrainosus: Secondary | ICD-10-CM | POA: Diagnosis not present

## 2021-07-23 MED ORDER — RIZATRIPTAN BENZOATE 10 MG PO TABS: 10.0000 mg | ORAL_TABLET | ORAL | 5 refills | Status: DC | PRN

## 2021-07-23 NOTE — Progress Notes (Signed)
New Patient Office Visit  Subjective:  Patient ID: Sarah Fields, female    DOB: 14-Jun-1976  Age: 45 y.o. MRN: [id number]  CC:  Chief Complaint  Patient presents with   New Patient (Initial Visit)    HPI Sarah Fields presents to establish new primary care provider.  Have been having a difficult time getting appointment scheduled for sick appointments and for routine physical so she has decided to change providers.  She sees an allergist and immunologist.  She takes allergy shots and takes allergy medications.  She also sees a neurologist due to essential tremor.  She sees a GYN provider.  She currently takes Micronor every day.  She sees a Armed forces operational officer.  Her biggest issue is migraine headaches.  These seem to be induced by heat.  She does go to a healthy weight and wellness clinic.  Most recent labs were done in January and will have them retested in the near future.  She has lost approximately 30 pounds since January.  Since she has lost weight.  Her last migraine headache was approximately 6 months ago.  She has no current concerns or complaints today.  She denies chest pain, chest pressure, or shortness of breath. She denies headaches or visual disturbances. She denies abdominal pain, nausea, vomiting, or changes in bowel or bladder habits.    Past Medical History:  Diagnosis Date   Allergy    Anxiety    Asthma    Broken foot    Depression    Essential tremor    Gestational diabetes    h/o   Joint pain    Migraines    with aura   Other fatigue    Shortness of breath    Shortness of breath on exertion    Tenosynovitis, de Quervain    Vitamin D deficiency     Past Surgical History:  Procedure Laterality Date   CESAREAN SECTION     2001, 2003   KNEE SURGERY Bilateral    arthroscopic   TUBAL LIGATION     WISDOM TOOTH EXTRACTION      Family History  Problem Relation Age of Onset   Hyperlipidemia Mother    Migraines Mother    Thyroid disease  Mother    Glaucoma Mother    Osteoporosis Mother    Anxiety disorder Mother    Seizures Father    Hypertension Father    Cancer Father 51       Prostate Ca   Glaucoma Father    Stroke Maternal Grandmother    Cancer Maternal Grandfather    Migraines Maternal Grandfather    Stroke Paternal Grandfather     Social History   Socioeconomic History   Marital status: Divorced    Spouse name: Not on file   Number of children: 2   Years of education: Not on file   Highest education level: Master's degree (e.g., MA, MS, MEng, MEd, MSW, MBA)  Occupational History   Occupation: infant toddler Scientific laboratory technician [employer]  Tobacco Use   Smoking status: Never   Smokeless tobacco: Never  Vaping Use   Vaping Use: Never used  Substance and Sexual Activity   Alcohol use: Never    Alcohol/week: 0.0 standard drinks   Drug use: Never   Sexual activity: Not Currently    Partners: Male    Birth control/protection: Surgical    Comment: Tubal  Other Topics Concern   Not on file  Social History Narrative   Lives at home  with her children   Right handed   Caffeine: 1 glass of tea per day?   Social Determinants of Health   Financial Resource Strain: Not on file  Food Insecurity: Not on file  Transportation Needs: Not on file  Physical Activity: Not on file  Stress: Not on file  Social Connections: Not on file  Intimate Partner Violence: Not on file    ROS Review of Systems  Constitutional:  Negative for activity change, appetite change, chills, fatigue and fever.  HENT:  Negative for congestion, postnasal drip, rhinorrhea, sinus pressure, sinus pain, sneezing and sore throat.   Eyes: Negative.   Respiratory:  Negative for cough, chest tightness, shortness of breath and wheezing.   Cardiovascular:  Negative for chest pain and palpitations.  Gastrointestinal:  Negative for abdominal pain, constipation, diarrhea, nausea and vomiting.  Endocrine: Negative for cold intolerance,  heat intolerance, polydipsia and polyuria.  Genitourinary:  Negative for dyspareunia, dysuria, flank pain, frequency and urgency.  Musculoskeletal:  Negative for arthralgias, back pain and myalgias.  Skin:  Negative for rash.  Allergic/Immunologic: Negative for environmental allergies.  Neurological:  Positive for headaches. Negative for dizziness and weakness.  Hematological:  Negative for adenopathy.  Psychiatric/Behavioral:  The patient is not nervous/anxious.    Objective:   Today's Vitals   07/23/21 1328  BP: 128/90  Pulse: 81  Temp: (!) 97.2 F (36.2 C)  SpO2: 99%  Weight: 266 lb 4.8 oz (120.8 kg)  Height: 5\' 4"  (1.626 m)   Body mass index is 45.71 kg/m.   Physical Exam Vitals and nursing note reviewed.  Constitutional:      Appearance: Normal appearance. She is well-developed. She is obese.  HENT:     Head: Normocephalic.  Eyes:     Pupils: Pupils are equal, round, and reactive to light.  Cardiovascular:     Rate and Rhythm: Normal rate and regular rhythm.     Pulses: Normal pulses.     Heart sounds: Normal heart sounds.  Pulmonary:     Effort: Pulmonary effort is normal.     Breath sounds: Normal breath sounds.  Abdominal:     Palpations: Abdomen is soft.  Musculoskeletal:        General: Normal range of motion.     Cervical back: Normal range of motion and neck supple.  Lymphadenopathy:     Cervical: No cervical adenopathy.  Skin:    General: Skin is warm and dry.     Capillary Refill: Capillary refill takes less than 2 seconds.  Neurological:     General: No focal deficit present.     Mental Status: She is alert and oriented to person, place, and time.  Psychiatric:        Mood and Affect: Mood normal.        Behavior: Behavior normal.        Thought Content: Thought content normal.        Judgment: Judgment normal.    Assessment & Plan:  1. Encounter to establish care Appointment today to establish new primary care provider.  2. Intractable  migraine with aura without status migrainosus Refill of Maxalt 10 mg tablets.  Take as needed at onset of migraine.  May repeat in 2 hours as needed for persistent symptoms. - rizatriptan (MAXALT) 10 MG tablet; Take 1 tablet (10 mg total) by mouth as needed for migraine. May repeat in 2 hours if needed  Dispense: 10 tablet; Refill: 5  3. Body mass index (BMI) of 45.0-49.9  in adult [facility]) Patient currently seeing healthy weight and wellness clinic.  She should continue to limit calorie intake to 1500 cal/day and consume a low-fat, low-cholesterol diet.  Problem List Items Addressed This Visit       Cardiovascular and Mediastinum   Intractable migraine with aura without status migrainosus   Relevant Medications   rizatriptan (MAXALT) 10 MG tablet     Other   Encounter to establish care - Primary   Other Visit Diagnoses     Body mass index (BMI) of 45.0-49.9 in adult [facility])           Outpatient Encounter Medications as of 07/23/2021  Medication Sig   albuterol (PROVENTIL HFA;VENTOLIN HFA) 108 (90 BASE) MCG/ACT inhaler Inhale 2 puffs into the lungs every 6 (six) hours as needed for wheezing or shortness of breath.    Azelastine-Fluticasone 137-50 MCG/ACT SUSP Place 1 spray into both nostrils 2 (two) times daily.   Calcium Carbonate-Vitamin D 600-200 MG-UNIT TABS Take 1 tablet by mouth daily.   EPINEPHrine 0.3 mg/0.3 mL IJ SOAJ injection INJECT IN OUTER THIGH AS NEEDED FOR ANAPHYLAXIS   fluticasone (FLONASE) 50 MCG/ACT nasal spray Place 2 sprays into both nostrils daily.   levocetirizine (XYZAL) 5 MG tablet SMARTSIG:1 Tablet(s) By Mouth Every Evening   Magnesium 400 MG CAPS Take 1 tablet by mouth daily.   montelukast (SINGULAIR) 10 MG tablet Take 10 mg by mouth at bedtime.    norethindrone (INCASSIA) 0.35 MG tablet Take 1 tablet (0.35 mg total) by mouth daily.   primidone (MYSOLINE) 50 MG tablet TAKE 1 TABLET BY MOUTH EVERYDAY AT BEDTIME   sodium chloride (OCEAN) 0.65 % SOLN nasal spray  Place 1 spray into both nostrils as needed for congestion.   SYMBICORT 160-4.5 MCG/ACT inhaler    Vitamin D, Ergocalciferol, (DRISDOL) 1.25 MG (50000 UNIT) CAPS capsule TAKE 1 CAPSULE BY MOUTH ONCE EVERY 3 DAYS.   [DISCONTINUED] buPROPion (WELLBUTRIN SR) 150 MG 12 hr tablet Take 1 tablet (150 mg total) by mouth in the morning.   [DISCONTINUED] metFORMIN (GLUCOPHAGE) 500 MG tablet TAKE 1 TABLET BY MOUTH EVERY DAY WITH BREAKFAST   [DISCONTINUED] rizatriptan (MAXALT) 10 MG tablet Take 10 mg by mouth as needed for migraine. May repeat in 2 hours if needed   rizatriptan (MAXALT) 10 MG tablet Take 1 tablet (10 mg total) by mouth as needed for migraine. May repeat in 2 hours if needed   No facility-administered encounter medications on file as of 07/23/2021.   This note was dictated using Conservation officer, historic buildings. Rapid proofreading was performed to expedite the delivery of the information. Despite proofreading, phonetic errors will occur which are common with this voice recognition software. Please take this into consideration. If there are any concerns, please contact our office.    Follow-up: Return in about 29 days (around 08/21/2021) for health maintenance exam.   Carlean Jews, NP

## 2021-07-25 ENCOUNTER — Encounter (INDEPENDENT_AMBULATORY_CARE_PROVIDER_SITE_OTHER): Payer: Self-pay | Admitting: Family Medicine

## 2021-07-25 ENCOUNTER — Other Ambulatory Visit: Payer: Self-pay

## 2021-07-25 ENCOUNTER — Ambulatory Visit (INDEPENDENT_AMBULATORY_CARE_PROVIDER_SITE_OTHER): Payer: 59 | Admitting: Family Medicine

## 2021-07-25 VITALS — BP 120/79 | HR 86 | Temp 98.1°F | Ht 64.0 in | Wt 264.0 lb

## 2021-07-25 DIAGNOSIS — F3289 Other specified depressive episodes: Secondary | ICD-10-CM

## 2021-07-25 DIAGNOSIS — E7849 Other hyperlipidemia: Secondary | ICD-10-CM | POA: Diagnosis not present

## 2021-07-25 DIAGNOSIS — R7303 Prediabetes: Secondary | ICD-10-CM

## 2021-07-25 DIAGNOSIS — Z6841 Body Mass Index (BMI) 40.0 and over, adult: Secondary | ICD-10-CM

## 2021-07-25 DIAGNOSIS — Z9189 Other specified personal risk factors, not elsewhere classified: Secondary | ICD-10-CM

## 2021-07-25 DIAGNOSIS — E559 Vitamin D deficiency, unspecified: Secondary | ICD-10-CM

## 2021-07-25 MED ORDER — BUPROPION HCL ER (SR) 150 MG PO TB12: 150.0000 mg | ORAL_TABLET | Freq: Every morning | ORAL | 0 refills | Status: DC

## 2021-07-25 MED ORDER — METFORMIN HCL 500 MG PO TABS: ORAL_TABLET | ORAL | 0 refills | Status: DC

## 2021-07-25 NOTE — Progress Notes (Signed)
Chief Complaint:   OBESITY Sarah Fields is here to discuss her progress with her obesity treatment plan along with follow-up of her obesity related diagnoses. Sarah Fields is on the Category 3 Plan and keeping a food journal and adhering to recommended goals of 450-600 calories and 40+ grams of protein at supper daily and states she is following her eating plan approximately 95% of the time. Sarah Fields states she is walking the stairs for 3 hours 3 times per week.   Today's visit was #: 14 Starting weight: 295 lbs Starting date: 12/13/2020 Today's weight: 264 lbs Today's date: 07/25/2021 Total lbs lost to date: 31 Total lbs lost since last in-office visit: 0  Interim History: Sarah Fields continues to work on diet and weight loss. She has had some extra challenges but she has been mindful and has made better food choices. She is due for labs but she is not fasting today, so she will come back next week to have them drawn.  Subjective:   1. Other hyperlipidemia Sarah Fields continues to work on diet and exercise, and she is due for labs.  2. Pre-diabetes Sarah Fields is stable on metformin, and she denies nausea or vomiting. She is due for labs.  3. Vitamin D deficiency Sarah Fields is stable on Vit D, and she is due for labs. She has no signs of over-replacement.  4. Other depression with emotional eating Sarah Fields is doing well on bupropion. She is doing well with decreasing emotional eating behaviors.  5. At risk for heart disease Sarah Fields is at a higher than average risk for cardiovascular disease due to obesity.   Assessment/Plan:   1. Other hyperlipidemia Cardiovascular risk and specific lipid/LDL goals reviewed. We discussed several lifestyle modifications today. We will recheck labs in next week. Abeeha will continue to work on diet, exercise and weight loss efforts. Orders and follow up as documented in patient record.   - Lipid Panel With LDL/HDL Ratio  2. Pre-diabetes Sarah Fields will  continue to work on weight loss, exercise, and decreasing simple carbohydrates to help decrease the risk of diabetes. We will recheck labs next week, and we will refill metformin for 1 month.  - metFORMIN (GLUCOPHAGE) 500 MG tablet; TAKE 1 TABLET BY MOUTH EVERY DAY WITH BREAKFAST  Dispense: 30 tablet; Refill: 0 - CMP14+EGFR - Insulin, random - Hemoglobin A1c  3. Vitamin D deficiency Low Vitamin D level contributes to fatigue and are associated with obesity, breast, and colon cancer. We will recheck labs in the next week. Sarah Fields will follow-up for routine testing of Vitamin D, at least 2-3 times per year to avoid over-replacement.  - VITAMIN D 25 Hydroxy (Vit-D Deficiency, Fractures)  4. Other depression with emotional eating Behavior modification techniques were discussed today to help Sarah Fields deal with her emotional/non-hunger eating behaviors. We will refill Wellbutrin SR for 1 month. Orders and follow up as documented in patient record.   - buPROPion (WELLBUTRIN SR) 150 MG 12 hr tablet; Take 1 tablet (150 mg total) by mouth in the morning.  Dispense: 30 tablet; Refill: 0  5. At risk for heart disease Sarah Fields was given approximately 15 minutes of coronary artery disease prevention counseling today. She is 45 y.o. female and has risk factors for heart disease including obesity. We discussed intensive lifestyle modifications today with an emphasis on specific weight loss instructions and strategies.   Repetitive spaced learning was employed today to elicit superior memory formation and behavioral change.  6. Obesity with current BMI 45.4 Sarah Fields is currently in  the action stage of change. As such, her goal is to continue with weight loss efforts. She has agreed to the Category 3 Plan.   Exercise goals: As is.  Behavioral modification strategies: celebration eating strategies.  Sarah Fields has agreed to follow-up with our clinic in 2 to 3 weeks. She was informed of the importance of  frequent follow-up visits to maximize her success with intensive lifestyle modifications for her multiple health conditions.   Objective:   Blood pressure 120/79, pulse 86, temperature 98.1 F (36.7 C), height '5\' 4"'  (1.626 m), weight 264 lb (119.7 kg), last menstrual period 06/27/2021, SpO2 99 %. Body mass index is 45.32 kg/m.  General: Cooperative, alert, well developed, in no acute distress. HEENT: Conjunctivae and lids unremarkable. Cardiovascular: Regular rhythm.  Lungs: Normal work of breathing. Neurologic: No focal deficits.   Lab Results  Component Value Date   CREATININE 0.98 12/13/2020   BUN 12 12/13/2020   NA 137 12/13/2020   K 4.9 12/13/2020   CL 101 12/13/2020   CO2 26 12/13/2020   Lab Results  Component Value Date   ALT 13 12/13/2020   AST 16 12/13/2020   ALKPHOS 97 12/13/2020   BILITOT 0.3 12/13/2020   Lab Results  Component Value Date   HGBA1C 5.4 12/13/2020   HGBA1C 5.5 05/30/2015   Lab Results  Component Value Date   INSULIN 21.4 12/13/2020   Lab Results  Component Value Date   TSH 4.200 12/13/2020   Lab Results  Component Value Date   CHOL 184 12/13/2020   HDL 37 (L) 12/13/2020   LDLCALC 110 (H) 12/13/2020   TRIG 214 (H) 12/13/2020   CHOLHDL 4.5 05/30/2015   Lab Results  Component Value Date   VD25OH 28.4 (L) 12/13/2020   Lab Results  Component Value Date   WBC 10.8 12/13/2020   HGB 13.6 12/13/2020   HCT 42.1 12/13/2020   MCV 92 12/13/2020   PLT 331 12/13/2020   No results found for: IRON, TIBC, FERRITIN  Attestation Statements:   Reviewed by clinician on day of visit: allergies, medications, problem list, medical history, surgical history, family history, social history, and previous encounter notes.   I, Trixie Dredge, am acting as transcriptionist for Dennard Nip, MD.  I have reviewed the above documentation for accuracy and completeness, and I agree with the above. -  Dennard Nip, MD

## 2021-08-01 ENCOUNTER — Other Ambulatory Visit (INDEPENDENT_AMBULATORY_CARE_PROVIDER_SITE_OTHER): Payer: Self-pay | Admitting: Family Medicine

## 2021-08-01 ENCOUNTER — Encounter (INDEPENDENT_AMBULATORY_CARE_PROVIDER_SITE_OTHER): Payer: Self-pay | Admitting: Family Medicine

## 2021-08-01 DIAGNOSIS — E559 Vitamin D deficiency, unspecified: Secondary | ICD-10-CM

## 2021-08-01 LAB — CMP14+EGFR
ALT: 8 IU/L (ref 0–32)
AST: 11 IU/L (ref 0–40)
Albumin/Globulin Ratio: 1.6 (ref 1.2–2.2)
Albumin: 3.9 g/dL (ref 3.8–4.8)
Alkaline Phosphatase: 104 IU/L (ref 44–121)
BUN/Creatinine Ratio: 17 (ref 9–23)
BUN: 17 mg/dL (ref 6–24)
Bilirubin Total: 0.2 mg/dL (ref 0.0–1.2)
CO2: 22 mmol/L (ref 20–29)
Calcium: 9.3 mg/dL (ref 8.7–10.2)
Chloride: 100 mmol/L (ref 96–106)
Creatinine, Ser: 1 mg/dL (ref 0.57–1.00)
Globulin, Total: 2.5 g/dL (ref 1.5–4.5)
Glucose: 86 mg/dL (ref 65–99)
Potassium: 5.2 mmol/L (ref 3.5–5.2)
Sodium: 138 mmol/L (ref 134–144)
Total Protein: 6.4 g/dL (ref 6.0–8.5)
eGFR: 70 mL/min/{1.73_m2} (ref >59)

## 2021-08-01 LAB — LIPID PANEL WITH LDL/HDL RATIO
Cholesterol, Total: 143 mg/dL (ref 100–199)
HDL: 37 mg/dL — ABNORMAL LOW (ref >39)
LDL Chol Calc (NIH): 80 mg/dL (ref 0–99)
LDL/HDL Ratio: 2.2 ratio (ref 0.0–3.2)
Triglycerides: 149 mg/dL (ref 0–149)
VLDL Cholesterol Cal: 26 mg/dL (ref 5–40)

## 2021-08-01 LAB — INSULIN, RANDOM: INSULIN: 19.6 u[IU]/mL (ref 2.6–24.9)

## 2021-08-01 LAB — VITAMIN D 25 HYDROXY (VIT D DEFICIENCY, FRACTURES): Vit D, 25-Hydroxy: 112 ng/mL — ABNORMAL HIGH (ref 30.0–100.0)

## 2021-08-01 LAB — HEMOGLOBIN A1C
Est. average glucose Bld gHb Est-mCnc: 111 mg/dL
Hgb A1c MFr Bld: 5.5 % (ref 4.8–5.6)

## 2021-08-01 NOTE — Telephone Encounter (Signed)
Dr.Beasley 

## 2021-08-04 DIAGNOSIS — G43119 Migraine with aura, intractable, without status migrainosus: Secondary | ICD-10-CM | POA: Insufficient documentation

## 2021-08-04 DIAGNOSIS — Z7689 Persons encountering health services in other specified circumstances: Secondary | ICD-10-CM | POA: Insufficient documentation

## 2021-08-04 NOTE — Patient Instructions (Signed)

## 2021-08-06 ENCOUNTER — Encounter (INDEPENDENT_AMBULATORY_CARE_PROVIDER_SITE_OTHER): Payer: Self-pay | Admitting: Family Medicine

## 2021-08-06 ENCOUNTER — Other Ambulatory Visit: Payer: Self-pay

## 2021-08-06 ENCOUNTER — Ambulatory Visit (INDEPENDENT_AMBULATORY_CARE_PROVIDER_SITE_OTHER): Payer: 59 | Admitting: Family Medicine

## 2021-08-06 VITALS — BP 121/82 | HR 81 | Temp 98.9°F | Ht 64.0 in | Wt 261.0 lb

## 2021-08-06 DIAGNOSIS — Z6841 Body Mass Index (BMI) 40.0 and over, adult: Secondary | ICD-10-CM

## 2021-08-06 DIAGNOSIS — Z9189 Other specified personal risk factors, not elsewhere classified: Secondary | ICD-10-CM

## 2021-08-06 DIAGNOSIS — E559 Vitamin D deficiency, unspecified: Secondary | ICD-10-CM

## 2021-08-06 DIAGNOSIS — E8881 Metabolic syndrome: Secondary | ICD-10-CM | POA: Diagnosis not present

## 2021-08-07 NOTE — Progress Notes (Signed)
Chief Complaint:   OBESITY Sarah Fields is here to discuss her progress with her obesity treatment plan along with follow-up of her obesity related diagnoses. Sarah Fields is on the Category 3 Plan and states she is following her eating plan approximately 93% of the time. Sarah Fields states she is riding the stationary bike and doing arm exercise for 10 minutes 2 times per week.  Today's visit was #: 15 Starting weight: 295 lbs Starting date: 12/13/2020 Today's weight: 261 lbs Today's date: 08/06/2021 Total lbs lost to date: 34 Total lbs lost since last in-office visit: 3  Interim History: Sarah Fields has done well with weight loss. She will be traveling soon and she is working on making some good damage control strategies.  Subjective:   1. Vitamin D deficiency Jazyah has been on Vit D prescription and her level is now borderline over-replaced. She denies nausea, vomiting, or muscle weakness.  2. Insulin resistance Sarah Fields is stable on metformin, and her labs are stable. She is doing well with diet, exercise, and weight loss.  3. At risk for nausea Sarah Fields is at risk for nausea due to elevated level of Vitamin D.  Assessment/Plan:   1. Vitamin D deficiency Low Vitamin D level contributes to fatigue and are associated with obesity, breast, and colon cancer. Sarah Fields agreed to discontinue prescription Vitamin D and we will recheck labs in 3 months. She will follow-up for routine testing of Vitamin D, at least 2-3 times per year to avoid over-replacement.  2. Insulin resistance Sarah Fields will continue metformin and her eating plan, and will continue to work on weight loss, exercise, and decreasing simple carbohydrates to help decrease the risk of diabetes. Sarah Fields agreed to follow-up with [place] as directed to closely monitor her progress.  3. At risk for nausea Sarah Fields was given approximately 15 minutes of nausea prevention counseling today. Sarah Fields is at risk for nausea  due to her new or current medication. She was encouraged to titrate her medication slowly, make sure to stay hydrated, eat smaller portions throughout the day, and avoid high fat meals.   4. Obesity with current BMI 44.9 Sarah Fields is currently in the action stage of change. As such, her goal is to continue with weight loss efforts. She has agreed to the Category 3 Plan.   Exercise goals: As is.  Behavioral modification strategies: increasing lean protein intake, increasing water intake, and travel eating strategies.  Sarah Fields has agreed to follow-up with our clinic in 2 to 3 weeks. She was informed of the importance of frequent follow-up visits to maximize her success with intensive lifestyle modifications for her multiple health conditions.   Objective:   Blood pressure 121/82, pulse 81, temperature 98.9 F (37.2 C), height 5\' 4"  (1.626 m), weight 261 lb (118.4 kg), SpO2 99 %. Body mass index is 44.8 kg/m.  General: Cooperative, alert, well developed, in no acute distress. HEENT: Conjunctivae and lids unremarkable. Cardiovascular: Regular rhythm.  Lungs: Normal work of breathing. Neurologic: No focal deficits.   Lab Results  Component Value Date   CREATININE 1.00 07/31/2021   BUN 17 07/31/2021   NA 138 07/31/2021   K 5.2 07/31/2021   CL 100 07/31/2021   CO2 22 07/31/2021   Lab Results  Component Value Date   ALT 8 07/31/2021   AST 11 07/31/2021   ALKPHOS 104 07/31/2021   BILITOT 0.2 07/31/2021   Lab Results  Component Value Date   HGBA1C 5.5 07/31/2021   HGBA1C 5.4 12/13/2020  HGBA1C 5.5 05/30/2015   Lab Results  Component Value Date   INSULIN 19.6 07/31/2021   INSULIN 21.4 12/13/2020   Lab Results  Component Value Date   TSH 4.200 12/13/2020   Lab Results  Component Value Date   CHOL 143 07/31/2021   HDL 37 (L) 07/31/2021   LDLCALC 80 07/31/2021   TRIG 149 07/31/2021   CHOLHDL 4.5 05/30/2015   Lab Results  Component Value Date   VD25OH 112.0 (H)  07/31/2021   VD25OH 28.4 (L) 12/13/2020   Lab Results  Component Value Date   WBC 10.8 12/13/2020   HGB 13.6 12/13/2020   HCT 42.1 12/13/2020   MCV 92 12/13/2020   PLT 331 12/13/2020   No results found for: IRON, TIBC, FERRITIN  Attestation Statements:   Reviewed by clinician on day of visit: allergies, medications, problem list, medical history, surgical history, family history, social history, and previous encounter notes.   I, Burt Knack, am acting as transcriptionist for Quillian Quince, MD.  I have reviewed the above documentation for accuracy and completeness, and I agree with the above. -  Quillian Quince, MD

## 2021-08-19 ENCOUNTER — Other Ambulatory Visit (INDEPENDENT_AMBULATORY_CARE_PROVIDER_SITE_OTHER): Payer: Self-pay | Admitting: Family Medicine

## 2021-08-19 DIAGNOSIS — R7303 Prediabetes: Secondary | ICD-10-CM

## 2021-08-20 NOTE — Telephone Encounter (Signed)
LAST APPOINTMENT DATE: 08/06/21 NEXT APPOINTMENT DATE: 08/22/21   [id number] - [place] - [address] [place] [address] Phone: [phone] Fax: [phone]  Patient is requesting a refill of the following medications: Requested Prescriptions   Pending Prescriptions Disp Refills   metFORMIN (GLUCOPHAGE) 500 MG tablet [Pharmacy Med Name: METFORMIN HCL 500 MG TABLET] 30 tablet 0    Sig: TAKE 1 TABLET BY MOUTH EVERY DAY WITH BREAKFAST    Date last filled: 07/25/21 Previously prescribed by [facility]  Lab Results  Component Value Date   HGBA1C 5.5 07/31/2021   HGBA1C 5.4 12/13/2020   HGBA1C 5.5 05/30/2015   Lab Results  Component Value Date   LDLCALC 80 07/31/2021   CREATININE 1.00 07/31/2021   Lab Results  Component Value Date   VD25OH 112.0 (H) 07/31/2021   VD25OH 28.4 (L) 12/13/2020    BP Readings from Last 3 Encounters:  08/06/21 121/82  07/25/21 120/79  07/23/21 128/90

## 2021-08-21 ENCOUNTER — Ambulatory Visit (INDEPENDENT_AMBULATORY_CARE_PROVIDER_SITE_OTHER): Payer: 59 | Admitting: Nurse Practitioner

## 2021-08-21 ENCOUNTER — Other Ambulatory Visit: Payer: Self-pay

## 2021-08-21 ENCOUNTER — Encounter: Payer: Self-pay | Admitting: Nurse Practitioner

## 2021-08-21 VITALS — BP 127/80 | HR 89 | Temp 98.5°F | Ht 64.0 in | Wt 236.4 lb

## 2021-08-21 DIAGNOSIS — Z6841 Body Mass Index (BMI) 40.0 and over, adult: Secondary | ICD-10-CM

## 2021-08-21 DIAGNOSIS — Z0001 Encounter for general adult medical examination with abnormal findings: Secondary | ICD-10-CM

## 2021-08-21 DIAGNOSIS — Z23 Encounter for immunization: Secondary | ICD-10-CM | POA: Diagnosis not present

## 2021-08-21 DIAGNOSIS — Z1211 Encounter for screening for malignant neoplasm of colon: Secondary | ICD-10-CM | POA: Diagnosis not present

## 2021-08-21 DIAGNOSIS — E786 Lipoprotein deficiency: Secondary | ICD-10-CM

## 2021-08-21 NOTE — Progress Notes (Signed)
Established Patient Office Visit  Subjective:  Patient ID: Sarah Fields, female    DOB: 1976/04/21  Age: 45 y.o. MRN: [id number]  CC:  Chief Complaint  Patient presents with   Annual Exam    HPI Sarah Fields presents for health maintenance exam.  She is reporting increased constipation. Stools are very hard. Causing her to have bright red blood in her stools. She does have a hemorrhoid. She knows this is flared up due to the hard and large stools she is having. Denies abdominal pain. She is currently seeing provider at [facility]. Currently taking wellbutrin and metformin t help with weight. She states that as weight comes off, she notes the constipation getting worse. She states that program is workingfor her. Has lost nearly 30 pounds since starting the program in 11/2020. She has add stool softener 148m daily. States that this is not helping too much right now.  She did have routine, fasting labs done prior to this visit. Lipid panel showed low HDL, but remainder of lipid panel was improved from prior studies.   Lipid Panel     Component Value Date/Time   CHOL 143 07/31/2021 0912   TRIG 149 07/31/2021 0912   HDL 37 (L) 07/31/2021 0912   CHOLHDL 4.5 05/30/2015 0922   VLDL 59 (H) 05/30/2015 0922   LDLCALC 80 07/31/2021 0912   LABVLDL 26 07/31/2021 0912    Vitamin d level was mildly elevated. This is being managed per [facility]. Cuting back dose of vitamin d from twice weekly to one time weekly.  She has no further concerns or complaints today. She denies chest pain, chest pressure, or shortness of breath. She denies headaches or visual disturbances. She denies abdominal pain, nausea, vomiting, or changes in bowel or bladder habits.   She would like ot get flu shot and Tdap during today's visit .  Past Medical History:  Diagnosis Date   Allergy    Anxiety    Asthma    Broken foot    Depression    Essential tremor     Gestational diabetes    h/o   Joint pain    Migraines    with aura   Other fatigue    Shortness of breath    Shortness of breath on exertion    Tenosynovitis, de Quervain    Vitamin D deficiency     Past Surgical History:  Procedure Laterality Date   CESAREAN SECTION     2001, 2003   KNEE SURGERY Bilateral    arthroscopic   TUBAL LIGATION     WISDOM TOOTH EXTRACTION      Family History  Problem Relation Age of Onset   Hyperlipidemia Mother    Migraines Mother    Thyroid disease Mother    Glaucoma Mother    Osteoporosis Mother    Anxiety disorder Mother    Seizures Father    Hypertension Father    Cancer Father [phone]      Prostate Ca   Glaucoma Father    Stroke Maternal Grandmother    Cancer Maternal Grandfather    Migraines Maternal Grandfather    Stroke Paternal Grandfather     Social History   Socioeconomic History   Marital status: Divorced    Spouse name: Not on file   Number of children: 2   Years of education: Not on file   Highest education level: Master's degree (e.g., MA, MS, MEng, MEd, MSW, MBA)  Occupational History   Occupation: infant toddler Artist [employer]  Tobacco Use   Smoking status: Never   Smokeless tobacco: Never  Vaping Use   Vaping Use: Never used  Substance and Sexual Activity   Alcohol use: Never    Alcohol/week: 0.0 standard drinks   Drug use: Never   Sexual activity: Not Currently    Partners: Male    Birth control/protection: Surgical    Comment: Tubal  Other Topics Concern   Not on file  Social History Narrative   Lives at home with her children   Right handed   Caffeine: 1 glass of tea per day?   Social Determinants of Health   Financial Resource Strain: Not on file  Food Insecurity: Not on file  Transportation Needs: Not on file  Physical Activity: Not on file  Stress: Not on file  Social Connections: Not on file  Intimate Partner Violence: Not on file    Outpatient Medications Prior to  Visit  Medication Sig Dispense Refill   albuterol (PROVENTIL HFA;VENTOLIN HFA) 108 (90 BASE) MCG/ACT inhaler Inhale 2 puffs into the lungs every 6 (six) hours as needed for wheezing or shortness of breath.      Azelastine-Fluticasone 137-50 MCG/ACT SUSP Place 1 spray into both nostrils 2 (two) times daily.     Calcium Carbonate-Vitamin D 600-200 MG-UNIT TABS Take 1 tablet by mouth daily.     EPINEPHrine 0.3 mg/0.3 mL IJ SOAJ injection INJECT IN OUTER THIGH AS NEEDED FOR ANAPHYLAXIS     fluticasone (FLONASE) 50 MCG/ACT nasal spray Place 2 sprays into both nostrils daily.     levocetirizine (XYZAL) 5 MG tablet SMARTSIG:1 Tablet(s) By Mouth Every Evening     Magnesium 400 MG CAPS Take 1 tablet by mouth daily.     montelukast (SINGULAIR) 10 MG tablet Take 10 mg by mouth at bedtime.      norethindrone (INCASSIA) 0.35 MG tablet Take 1 tablet (0.35 mg total) by mouth daily. 84 tablet 3   primidone (MYSOLINE) 50 MG tablet TAKE 1 TABLET BY MOUTH EVERYDAY AT BEDTIME 90 tablet 4   rizatriptan (MAXALT) 10 MG tablet Take 1 tablet (10 mg total) by mouth as needed for migraine. May repeat in 2 hours if needed 10 tablet 5   sodium chloride (OCEAN) 0.65 % SOLN nasal spray Place 1 spray into both nostrils as needed for congestion.  0   SYMBICORT 160-4.5 MCG/ACT inhaler      Vitamin D, Ergocalciferol, (DRISDOL) 1.25 MG (50000 UNIT) CAPS capsule TAKE 1 CAPSULE BY MOUTH ONCE EVERY 3 DAYS. 10 capsule 0   buPROPion (WELLBUTRIN SR) 150 MG 12 hr tablet Take 1 tablet (150 mg total) by mouth in the morning. 30 tablet 0   metFORMIN (GLUCOPHAGE) 500 MG tablet TAKE 1 TABLET BY MOUTH EVERY DAY WITH BREAKFAST 30 tablet 0   No facility-administered medications prior to visit.    Allergies  Allergen Reactions   Banana    Hydrocodone Other (See Comments)   Latex    Other     Pine apples, strawberries, banannas  "one of the 'codones makes me itch" but patient states she has been prescribed them   Penicillins Swelling     Patient only has allergy to the penicillin shot, she can take it orally with no reactions.   Pineapple    Propranolol     Worsening asthma   Strawberry Extract    Adhesive [Tape] Rash   Bactrim [Sulfamethoxazole-Trimethoprim] Rash    ROS Review  of Systems  Constitutional:  Negative for activity change, appetite change, chills, fatigue and fever.       Patient doing well with program at [facility].  Has lost approximately 30 pounds since starting on program.  HENT:  Negative for congestion, postnasal drip, rhinorrhea, sinus pressure, sinus pain, sneezing and sore throat.   Eyes: Negative.   Respiratory:  Negative for cough, chest tightness, shortness of breath and wheezing.   Cardiovascular:  Negative for chest pain and palpitations.  Gastrointestinal:  Positive for blood in stool and constipation. Negative for abdominal pain, diarrhea, nausea and vomiting.  Endocrine: Negative for cold intolerance, heat intolerance, polydipsia and polyuria.  Genitourinary:  Negative for dyspareunia, dysuria, flank pain, frequency and urgency.  Musculoskeletal:  Negative for arthralgias, back pain and myalgias.  Skin:  Negative for rash.  Allergic/Immunologic: Negative for environmental allergies.  Neurological:  Negative for dizziness, weakness and headaches.  Hematological:  Negative for adenopathy.  Psychiatric/Behavioral:  The patient is not nervous/anxious.      Objective:    Physical Exam Vitals and nursing note reviewed.  Constitutional:      Appearance: Normal appearance. She is well-developed. She is obese.  HENT:     Head: Normocephalic and atraumatic.     Right Ear: Tympanic membrane, ear canal and external ear normal.     Left Ear: Tympanic membrane, ear canal and external ear normal.     Nose: Nose normal.     Mouth/Throat:     Mouth: Mucous membranes are moist.     Pharynx: Oropharynx is clear.  Eyes:     Extraocular Movements: Extraocular movements intact.      Conjunctiva/sclera: Conjunctivae normal.     Pupils: Pupils are equal, round, and reactive to light.  Neck:     Vascular: No carotid bruit.  Cardiovascular:     Rate and Rhythm: Normal rate and regular rhythm.     Pulses: Normal pulses.     Heart sounds: Normal heart sounds.  Pulmonary:     Effort: Pulmonary effort is normal.     Breath sounds: Normal breath sounds.  Abdominal:     General: Bowel sounds are normal.     Palpations: Abdomen is soft.     Tenderness: There is no abdominal tenderness.  Musculoskeletal:        General: Normal range of motion.     Cervical back: Normal range of motion and neck supple.  Skin:    General: Skin is warm and dry.     Capillary Refill: Capillary refill takes less than 2 seconds.  Neurological:     General: No focal deficit present.     Mental Status: She is alert and oriented to person, place, and time.  Psychiatric:        Mood and Affect: Mood normal.        Behavior: Behavior normal.        Thought Content: Thought content normal.        Judgment: Judgment normal.    Today's Vitals   08/21/21 0947  BP: 127/80  Pulse: 89  Temp: 98.5 F (36.9 C)  SpO2: 97%  Weight: 236 lb 6.4 oz (107.2 kg)  Height: '5\' 4"'  (1.626 m)   Body mass index is 40.58 kg/m.   Wt Readings from Last 3 Encounters:  08/22/21 259 lb (117.5 kg)  08/21/21 236 lb 6.4 oz (107.2 kg)  08/06/21 261 lb (118.4 kg)     Health Maintenance Due  Topic Date  Due   Hepatitis C Screening  Never done   COLONOSCOPY (Pts 45-72yr Insurance coverage will need to be confirmed)  Never done   COVID-19 Vaccine (3 - Booster for Moderna series) 09/15/2020    There are no preventive care reminders to display for this patient.  Lab Results  Component Value Date   TSH 4.200 12/13/2020   Lab Results  Component Value Date   WBC 10.8 12/13/2020   HGB 13.6 12/13/2020   HCT 42.1 12/13/2020   MCV 92 12/13/2020   PLT 331 12/13/2020   Lab Results  Component Value Date   NA  138 07/31/2021   K 5.2 07/31/2021   CO2 22 07/31/2021   GLUCOSE 86 07/31/2021   BUN 17 07/31/2021   CREATININE 1.00 07/31/2021   BILITOT 0.2 07/31/2021   ALKPHOS 104 07/31/2021   AST 11 07/31/2021   ALT 8 07/31/2021   PROT 6.4 07/31/2021   ALBUMIN 3.9 07/31/2021   CALCIUM 9.3 07/31/2021   ANIONGAP 13 01/19/2020   EGFR 70 07/31/2021   Lab Results  Component Value Date   CHOL 143 07/31/2021   Lab Results  Component Value Date   HDL 37 (L) 07/31/2021   Lab Results  Component Value Date   LDLCALC 80 07/31/2021   Lab Results  Component Value Date   TRIG 149 07/31/2021   Lab Results  Component Value Date   CHOLHDL 4.5 05/30/2015   Lab Results  Component Value Date   HGBA1C 5.5 07/31/2021      Assessment & Plan:  1. Encounter for general adult medical examination with abnormal findings Annual wellness visit today.  2. Low HDL (under 40) Reviewed labs.  HDL 37 with goal to increase to greater than 40.  Reviewed diet suggestions to help increase good cholesterol and lower back cholesterol.  We will recheck lipid panel in 1 year.  3. Body mass index (BMI) of 40.1-44.9 in adult ([facility] Patient currently in program at [facility].  Has already lost 30 pounds.  She should continue a low calorie diet, under 1500 cal/day.  She should consume a low-fat, low-cholesterol diet.  She should incorporate low impact exercise into her daily routine.  Continue with program at [facility].  4. Screening for colon cancer Refer to GI for colon cancer screening. - Ambulatory referral to Gastroenterology  5. Need for Tdap vaccination Tdap vaccine administered during today's visit.   - Tdap vaccine greater than or equal to 7yo IM  6. Need for influenza vaccination Flu shot given at today's visit.  - Flu Vaccine QUAD 6+ mos PF IM (Fluarix Quad PF)   Problem List Items Addressed This Visit       Other   Encounter for general adult medical examination  with abnormal findings - Primary   Low HDL (under 40)   Body mass index (BMI) of 40.1-44.9 in adult ([facility]   Screening for colon cancer   Relevant Orders   Ambulatory referral to Gastroenterology   Need for Tdap vaccination   Relevant Orders   Tdap vaccine greater than or equal to 7yo IM (Completed)   Need for influenza vaccination   Relevant Orders   Flu Vaccine QUAD 6+ mos PF IM (Fluarix Quad PF) (Completed)   This note was dictated using [employer] analyst Rapid proofreading was performed to expedite the delivery of the information. Despite proofreading, phonetic errors will occur which are common with this voice recognition software. Please take this into consideration.  If there are any concerns, please contact our office.    Follow-up: Return in about 6 months (around 02/18/2022) for migraines .    Ronnell Freshwater, NP

## 2021-08-22 ENCOUNTER — Encounter (INDEPENDENT_AMBULATORY_CARE_PROVIDER_SITE_OTHER): Payer: Self-pay | Admitting: Family Medicine

## 2021-08-22 ENCOUNTER — Other Ambulatory Visit: Payer: Self-pay

## 2021-08-22 ENCOUNTER — Ambulatory Visit (INDEPENDENT_AMBULATORY_CARE_PROVIDER_SITE_OTHER): Payer: 59 | Admitting: Family Medicine

## 2021-08-22 VITALS — BP 110/78 | HR 91 | Temp 99.4°F | Ht 64.0 in | Wt 259.0 lb

## 2021-08-22 DIAGNOSIS — K5909 Other constipation: Secondary | ICD-10-CM | POA: Diagnosis not present

## 2021-08-22 DIAGNOSIS — F3289 Other specified depressive episodes: Secondary | ICD-10-CM

## 2021-08-22 DIAGNOSIS — Z9189 Other specified personal risk factors, not elsewhere classified: Secondary | ICD-10-CM

## 2021-08-22 DIAGNOSIS — E8881 Metabolic syndrome: Secondary | ICD-10-CM

## 2021-08-22 DIAGNOSIS — Z6841 Body Mass Index (BMI) 40.0 and over, adult: Secondary | ICD-10-CM

## 2021-08-22 MED ORDER — BUPROPION HCL ER (SR) 150 MG PO TB12: 150.0000 mg | ORAL_TABLET | Freq: Every morning | ORAL | 0 refills | Status: DC

## 2021-08-22 MED ORDER — POLYETHYLENE GLYCOL 3350 17 G PO PACK: 17.0000 g | PACK | Freq: Every day | ORAL | 0 refills | Status: DC

## 2021-08-22 MED ORDER — METFORMIN HCL 500 MG PO TABS: 500.0000 mg | ORAL_TABLET | Freq: Every day | ORAL | 0 refills | Status: DC

## 2021-08-22 NOTE — Progress Notes (Signed)
Chief Complaint:   OBESITY Sarah Fields is here to discuss her progress with her obesity treatment plan along with follow-up of her obesity related diagnoses. Sarah Fields is on the Category 3 Plan and states she is following her eating plan approximately 80% of the time. Sarah Fields states she walked at a retreat 7 days.   Today's visit was #: 16 Starting weight: 295 lbs Starting date: 12/13/2020 Today's weight: 259 lbs Today's date: 08/22/2021 Total lbs lost to date: 36 Total lbs lost since last in-office visit: 2  Interim History: Sarah Fields continues to do well with weight loss. She was on a work retreat and she did very well with planning ahead to have better food and snacks available. Her hunger is mostly controlled.  Subjective:   1. Insulin resistance Sarah Fields is doing well with diet, weight loss, and metformin. She denies nausea or vomiting.  2. Other constipation Sarah Fields her constipation has worsened in the last few weeks.  3. Other depression with emotional eating Sarah Fields is stable on Wellbutrin, and she is doing very well with planning ahead and decreasing emotional eating behaviors.  4. At risk for heart disease Sarah Fields is at a higher than average risk for cardiovascular disease due to obesity.   Assessment/Plan:   1. Insulin resistance Sarah Fields will continue to work on weight loss, exercise, and decreasing simple carbohydrates to help decrease the risk of diabetes. We will refill metformin for 1 month. Sarah Fields agreed to follow-up with [place] as directed to closely monitor her progress.  - metFORMIN (GLUCOPHAGE) 500 MG tablet; Take 1 tablet (500 mg total) by mouth daily with breakfast.  Dispense: 30 tablet; Refill: 0  2. Other constipation Sarah Fields agreed to start miralax 17 grams q daily, and increase her water intake. She was informed that a decrease in bowel movement frequency is normal while losing weight, but stools should not be hard or painful. Orders and follow up  as documented in patient record.   - polyethylene glycol (MIRALAX / GLYCOLAX) 17 g packet; Take 17 g by mouth daily. With increased water intake.  Dispense: 30 each; Refill: 0  3. Other depression with emotional eating Behavior modification techniques were discussed today to help Sarah Fields deal with her emotional/non-hunger eating behaviors. We will refill Wellbutrin SR for 1 month. Orders and follow up as documented in patient record.   - buPROPion (WELLBUTRIN SR) 150 MG 12 hr tablet; Take 1 tablet (150 mg total) by mouth in the morning.  Dispense: 30 tablet; Refill: 0  4. At risk for heart disease Sarah Fields was given approximately 15 minutes of coronary artery disease prevention counseling today. She is 45 y.o. female and has risk factors for heart disease including obesity. We discussed intensive lifestyle modifications today with an emphasis on specific weight loss instructions and strategies.   Repetitive spaced learning was employed today to elicit superior memory formation and behavioral change.  5. Obesity with current BMI 44.5 Sarah Fields is currently in the action stage of change. As such, her goal is to continue with weight loss efforts. She has agreed to the Category 3 Plan.   Behavioral modification strategies: increasing lean protein intake.  Sarah Fields has agreed to follow-up with our clinic in 2 to 3 weeks. She was informed of the importance of frequent follow-up visits to maximize her success with intensive lifestyle modifications for her multiple health conditions.   Objective:   Blood pressure 110/78, pulse 91, temperature 99.4 F (37.4 C), height 5\' 4"  (1.626 m), weight 259 lb (117.5  kg), last menstrual period 08/08/2021, SpO2 98 %. Body mass index is 44.46 kg/m.  General: Cooperative, alert, well developed, in no acute distress. HEENT: Conjunctivae and lids unremarkable. Cardiovascular: Regular rhythm.  Lungs: Normal work of breathing. Neurologic: No focal deficits.    Lab Results  Component Value Date   CREATININE 1.00 07/31/2021   BUN 17 07/31/2021   NA 138 07/31/2021   K 5.2 07/31/2021   CL 100 07/31/2021   CO2 22 07/31/2021   Lab Results  Component Value Date   ALT 8 07/31/2021   AST 11 07/31/2021   ALKPHOS 104 07/31/2021   BILITOT 0.2 07/31/2021   Lab Results  Component Value Date   HGBA1C 5.5 07/31/2021   HGBA1C 5.4 12/13/2020   HGBA1C 5.5 05/30/2015   Lab Results  Component Value Date   INSULIN 19.6 07/31/2021   INSULIN 21.4 12/13/2020   Lab Results  Component Value Date   TSH 4.200 12/13/2020   Lab Results  Component Value Date   CHOL 143 07/31/2021   HDL 37 (L) 07/31/2021   LDLCALC 80 07/31/2021   TRIG 149 07/31/2021   CHOLHDL 4.5 05/30/2015   Lab Results  Component Value Date   VD25OH 112.0 (H) 07/31/2021   VD25OH 28.4 (L) 12/13/2020   Lab Results  Component Value Date   WBC 10.8 12/13/2020   HGB 13.6 12/13/2020   HCT 42.1 12/13/2020   MCV 92 12/13/2020   PLT 331 12/13/2020   No results found for: IRON, TIBC, FERRITIN  Attestation Statements:   Reviewed by clinician on day of visit: allergies, medications, problem list, medical history, surgical history, family history, social history, and previous encounter notes.   I, Burt Knack, am acting as transcriptionist for Quillian Quince, MD.  I have reviewed the above documentation for accuracy and completeness, and I agree with the above. -   Quillian Quince, MD

## 2021-09-01 DIAGNOSIS — Z23 Encounter for immunization: Secondary | ICD-10-CM | POA: Insufficient documentation

## 2021-09-01 DIAGNOSIS — Z6841 Body Mass Index (BMI) 40.0 and over, adult: Secondary | ICD-10-CM | POA: Insufficient documentation

## 2021-09-01 DIAGNOSIS — E786 Lipoprotein deficiency: Secondary | ICD-10-CM | POA: Insufficient documentation

## 2021-09-01 DIAGNOSIS — Z1211 Encounter for screening for malignant neoplasm of colon: Secondary | ICD-10-CM | POA: Insufficient documentation

## 2021-09-01 DIAGNOSIS — Z0001 Encounter for general adult medical examination with abnormal findings: Secondary | ICD-10-CM | POA: Insufficient documentation

## 2021-09-01 NOTE — Patient Instructions (Signed)

## 2021-09-05 ENCOUNTER — Encounter (INDEPENDENT_AMBULATORY_CARE_PROVIDER_SITE_OTHER): Payer: Self-pay | Admitting: Family Medicine

## 2021-09-05 ENCOUNTER — Ambulatory Visit (INDEPENDENT_AMBULATORY_CARE_PROVIDER_SITE_OTHER): Payer: 59 | Admitting: Family Medicine

## 2021-09-05 ENCOUNTER — Other Ambulatory Visit: Payer: Self-pay

## 2021-09-05 ENCOUNTER — Ambulatory Visit
Admission: EM | Admit: 2021-09-05 | Discharge: 2021-09-05 | Disposition: A | Discharge status: Home / Self Care | Payer: 59 | Attending: Internal Medicine | Admitting: Internal Medicine

## 2021-09-05 VITALS — BP 128/85 | HR 80 | Temp 98.2°F | Ht 64.0 in | Wt 259.0 lb

## 2021-09-05 DIAGNOSIS — L03213 Periorbital cellulitis: Secondary | ICD-10-CM | POA: Diagnosis not present

## 2021-09-05 DIAGNOSIS — L0390 Cellulitis, unspecified: Secondary | ICD-10-CM

## 2021-09-05 DIAGNOSIS — E8881 Metabolic syndrome: Secondary | ICD-10-CM

## 2021-09-05 DIAGNOSIS — Z6841 Body Mass Index (BMI) 40.0 and over, adult: Secondary | ICD-10-CM | POA: Diagnosis not present

## 2021-09-05 MED ORDER — CEFDINIR 300 MG PO CAPS: 300.0000 mg | ORAL_CAPSULE | Freq: Two times a day (BID) | ORAL | 0 refills | Status: AC

## 2021-09-05 NOTE — Discharge Instructions (Signed)
You have preseptal cellulitis which is an infection of the skin of your eye.  You have been prescribed cefdinir antibiotic to treat this.  Please go the hospital if symptoms do not improve in the next 24 to 48 hours.

## 2021-09-05 NOTE — ED Triage Notes (Signed)
Pt c/o rash to left lateral supraorbital area onset last Tuesday. Also describes "lumps" under skin around left side of face. Someone expressed to her that it may be cellulitis and pt wants to be evaluated. Pt states that the rash hurts but denies itching.

## 2021-09-05 NOTE — ED Provider Notes (Signed)
[facility]    CSN: [id number] Arrival date & time: 09/05/21  0940      History   Chief Complaint Chief Complaint  Patient presents with   Rash    HPI Sarah Fields is a 45 y.o. female.   Patient presents with rash to left eye that started approximately 2 days ago.  Patient reports that the eye is not painful but it is irritating.  Denies any itchiness or any drainage from the eye.  Denies pain with extraocular movements.  Denies any blurry vision or trauma to the eye.  Denies any foreign bodies in the eye.  Patient also reports some raised areas in front of the ear as well as on the neck that she is concerned about.  Denies any fevers, upper respiratory symptoms, sore throat, any known sick contacts.   Rash  Past Medical History:  Diagnosis Date   Allergy    Anxiety    Asthma    Broken foot    Depression    Essential tremor    Gestational diabetes    h/o   Joint pain    Migraines    with aura   Other fatigue    Shortness of breath    Shortness of breath on exertion    Tenosynovitis, de Quervain    Vitamin D deficiency     Patient Active Problem List   Diagnosis Date Noted   Encounter for general adult medical examination with abnormal findings 09/01/2021   Low HDL (under 40) 09/01/2021   Body mass index (BMI) of 40.1-44.9 in adult (HCC) 09/01/2021   Screening for colon cancer 09/01/2021   Need for Tdap vaccination 09/01/2021   Need for influenza vaccination 09/01/2021   Encounter to establish care 08/04/2021   Intractable migraine with aura without status migrainosus 08/04/2021   Insulin resistance 03/21/2021   Vitamin D deficiency 03/21/2021   At risk for malnutrition 03/21/2021   Essential tremor 03/13/2020   Asthma attack 01/19/2020   Asthma 01/19/2020   Acute respiratory failure with hypoxia (HCC)    Hypertriglyceridemia 05/31/2015    Past Surgical History:  Procedure Laterality Date   CESAREAN SECTION     2001, 2003    KNEE SURGERY Bilateral    arthroscopic   TUBAL LIGATION     WISDOM TOOTH EXTRACTION      OB History     Gravida  2   Para  2   Term  2   Preterm      AB      Living  2      SAB      IAB      Ectopic      Multiple      Live Births               Home Medications    Prior to Admission medications   Medication Sig Start Date End Date Taking? Authorizing Provider  cefdinir (OMNICEF) 300 MG capsule Take 1 capsule (300 mg total) by mouth 2 (two) times daily for 10 days. 09/05/21 09/15/21 Yes Lance Muss, FNP  albuterol (PROVENTIL HFA;VENTOLIN HFA) 108 (90 BASE) MCG/ACT inhaler Inhale 2 puffs into the lungs every 6 (six) hours as needed for wheezing or shortness of breath.     [provider]  Azelastine-Fluticasone 137-50 MCG/ACT SUSP Place 1 spray into both nostrils 2 (two) times daily. 03/09/20   [provider]  buPROPion (WELLBUTRIN SR) 150 MG 12 hr tablet Take 1  tablet (150 mg total) by mouth in the morning. 08/22/21   Quillian Quince D, MD  Calcium Carbonate-Vitamin D 600-200 MG-UNIT TABS Take 1 tablet by mouth daily.    [provider]  EPINEPHrine 0.3 mg/0.3 mL IJ SOAJ injection INJECT IN OUTER THIGH AS NEEDED FOR ANAPHYLAXIS 02/15/20   [provider]  fluticasone (FLONASE) 50 MCG/ACT nasal spray Place 2 sprays into both nostrils daily.    [provider]  levocetirizine (XYZAL) 5 MG tablet SMARTSIG:1 Tablet(s) By Mouth Every Evening 02/09/20   [provider]  Magnesium 400 MG CAPS Take 1 tablet by mouth daily.    [provider]  metFORMIN (GLUCOPHAGE) 500 MG tablet Take 1 tablet (500 mg total) by mouth daily with breakfast. 08/22/21   Quillian Quince D, MD  montelukast (SINGULAIR) 10 MG tablet Take 10 mg by mouth at bedtime.     [provider]  norethindrone (INCASSIA) 0.35 MG tablet Take 1 tablet (0.35 mg total) by mouth daily. 09/19/20   Patton Salles, MD  polyethylene glycol  (MIRALAX / GLYCOLAX) 17 g packet Take 17 g by mouth daily. With increased water intake. 08/22/21   Quillian Quince D, MD  primidone (MYSOLINE) 50 MG tablet TAKE 1 TABLET BY MOUTH EVERYDAY AT BEDTIME 03/09/21   Anson Fret, MD  rizatriptan (MAXALT) 10 MG tablet Take 1 tablet (10 mg total) by mouth as needed for migraine. May repeat in 2 hours if needed 07/23/21   Carlean Jews, NP  sodium chloride (OCEAN) 0.65 % SOLN nasal spray Place 1 spray into both nostrils as needed for congestion. 01/20/20   Joseph Art, DO  SYMBICORT 160-4.5 MCG/ACT inhaler  03/09/20   [provider]  Vitamin D, Ergocalciferol, (DRISDOL) 1.25 MG (50000 UNIT) CAPS capsule TAKE 1 CAPSULE BY MOUTH ONCE EVERY 3 DAYS. 07/10/21   Wilder Glade, MD    Family History Family History  Problem Relation Age of Onset   Hyperlipidemia Mother    Migraines Mother    Thyroid disease Mother    Glaucoma Mother    Osteoporosis Mother    Anxiety disorder Mother    Seizures Father    Hypertension Father    Cancer Father 26       Prostate Ca   Glaucoma Father    Stroke Maternal Grandmother    Cancer Maternal Grandfather    Migraines Maternal Grandfather    Stroke Paternal Grandfather     Social History Social History   Tobacco Use   Smoking status: Never   Smokeless tobacco: Never  Vaping Use   Vaping Use: Never used  Substance Use Topics   Alcohol use: Never    Alcohol/week: 0.0 standard drinks   Drug use: Never     Allergies   Banana, Hydrocodone, Latex, Other, Penicillins, Pineapple, Propranolol, Strawberry extract, Adhesive [tape], and Bactrim [sulfamethoxazole-trimethoprim]   Review of Systems Review of Systems Per HPI  Physical Exam Triage Vital Signs ED Triage Vitals  Enc Vitals Group     BP 09/05/21 0948 (!) 136/103     Pulse Rate 09/05/21 0948 86     Resp 09/05/21 0948 18     Temp 09/05/21 0948 98.8 F (37.1 C)     Temp Source 09/05/21 0948 Oral     SpO2 09/05/21 0948 98 %      Weight --      Height --      Head Circumference --      Peak  Flow --      Pain Score 09/05/21 0949 3     Pain Loc --      Pain Edu? --      Excl. in GC? --    No data found.  Updated Vital Signs BP (!) 136/103 (BP Location: Left Arm)   Pulse 86   Temp 98.8 F (37.1 C) (Oral)   Resp 18   LMP 08/08/2021   SpO2 98%   Visual Acuity Right Eye Distance: 20/25 Left Eye Distance: 20/30 Bilateral Distance: 20/20  Right Eye Near:   Left Eye Near:    Bilateral Near:     Physical Exam Constitutional:      General: She is not in acute distress.    Appearance: Normal appearance. She is not toxic-appearing or diaphoretic.  HENT:     Head: Normocephalic and atraumatic.     Right Ear: Tympanic membrane and ear canal normal.     Left Ear: Tympanic membrane and ear canal normal.     Nose: Nose normal.     Mouth/Throat:     Mouth: Mucous membranes are moist.     Pharynx: No posterior oropharyngeal erythema.  Eyes:     General: Lids are everted, no foreign bodies appreciated. Vision grossly intact. Gaze aligned appropriately.        Left eye: No foreign body, discharge or hordeolum.     Extraocular Movements: Extraocular movements intact.     Conjunctiva/sclera: Conjunctivae normal.     Pupils: Pupils are equal, round, and reactive to light.     Comments: Swelling and redness to left upper eyelid as well as area of skin directly above eyebrow to the left.  Normal conjunctivae.  No drainage noted from eye.  No scleral redness.  No drainage from skin.  Cardiovascular:     Rate and Rhythm: Normal rate and regular rhythm.     Pulses: Normal pulses.     Heart sounds: Normal heart sounds.  Pulmonary:     Effort: Pulmonary effort is normal.     Breath sounds: Normal breath sounds.  Lymphadenopathy:     Head:     Left side of head: Preauricular adenopathy present.     Cervical: Cervical adenopathy present.     Left cervical: Superficial cervical adenopathy present.  Skin:    General:  Skin is warm and dry.  Neurological:     General: No focal deficit present.     Mental Status: She is alert and oriented to person, place, and time. Mental status is at baseline.  Psychiatric:        Mood and Affect: Mood normal.        Behavior: Behavior normal.        Thought Content: Thought content normal.        Judgment: Judgment normal.     UC Treatments / Results  Labs (all labs ordered are listed, but only abnormal results are displayed) Labs Reviewed - No data to display  EKG   Radiology No results found.  Procedures Procedures (including critical care time)  Medications Ordered in UC Medications - No data to display  Initial Impression / Assessment and Plan / UC Course  I have reviewed the triage vital signs and the nursing notes.  Pertinent labs & imaging results that were available during my care of the patient were reviewed by me and considered in my medical decision making (see chart for details).     Physical exam seems consistent with preseptal cellulitis.  Patient also has lymphadenopathy at preauricular and cervical lymph nodes.  Will treat with cefdinir antibiotic x10 days.  Patient given strict ER precautions if symptoms do not improve in the next 24 to 48 hours. No red flags on exam.  Visual acuity was fairly normal.  Low suspicion for orbital cellulitis.Discussed strict return precautions. Patient verbalized understanding and is agreeable with plan.  Final Clinical Impressions(s) / UC Diagnoses   Final diagnoses:  Preseptal cellulitis of left eye     Discharge Instructions      You have preseptal cellulitis which is an infection of the skin of your eye.  You have been prescribed cefdinir antibiotic to treat this.  Please go the hospital if symptoms do not improve in the next 24 to 48 hours.     ED Prescriptions     Medication Sig Dispense Auth. Provider   cefdinir (OMNICEF) 300 MG capsule Take 1 capsule (300 mg total) by mouth 2 (two)  times daily for 10 days. 20 capsule Lance Muss, FNP      PDMP not reviewed this encounter.   Lance Muss, FNP 09/05/21 1042

## 2021-09-05 NOTE — Progress Notes (Signed)
Chief Complaint:   OBESITY Leonilda is here to discuss her progress with her obesity treatment plan along with follow-up of her obesity related diagnoses. Inda is on the Category 3 Plan and states she is following her eating plan approximately 90% of the time. Pema states she is riding the bike for 5 minutes 3 times per week.  Today's visit was #: 17 Starting weight: 295 lbs Starting date: 12/13/2020 Today's weight: 259 lbs Today's date: 09/05/2021 Total lbs lost to date: 36 Total lbs lost since last in-office visit: 0  Interim History: Dung has done well maintaining her weight loss. She is working on [employer] and prepping, and making healthier choices. She is retaining a little bit of fluid today.  Subjective:   1. Insulin resistance Charon is stable on metformin, and she is working on her diet. No nausea, vomiting, or hypoglycemia noted.  2. Cellulitis, unspecified cellulitis site Jenaya has 2 days of erythema and swelling superior-lateral to her left eye. No injury was noted, but she notes induration. She has some preauricular and anterior chain LAD which is tender.  Assessment/Plan:   1. Insulin resistance Lashaunta will continue her metformin and diet, and we will recheck labs in 1 month. Nutrition was discussed today. Brianny agreed to follow-up with [place] as directed to closely monitor her progress.  2. Cellulitis, unspecified cellulitis site Shemeika was advised that this is likely cellulitis, and in light of the proximity of her eye she should see urgent care today for more in depth evaluation and treatment. She agreed to be seen today.  3. Obesity with current BMI 44.6 Sanaii is currently in the action stage of change. As such, her goal is to continue with weight loss efforts. She has agreed to the Category 3 Plan.   Exercise goals: As is.  Behavioral modification strategies: no skipping meals and meal planning and cooking  strategies.  Alyric has agreed to follow-up with our clinic in 4 weeks. She was informed of the importance of frequent follow-up visits to maximize her success with intensive lifestyle modifications for her multiple health conditions.   Objective:   Blood pressure 128/85, pulse 80, temperature 98.2 F (36.8 C), height 5\' 4"  (1.626 m), weight 259 lb (117.5 kg), last menstrual period 08/08/2021, SpO2 97 %. Body mass index is 44.46 kg/m.  General: Cooperative, alert, well developed, in no acute distress. HEENT: Conjunctivae and lids unremarkable. Cardiovascular: Regular rhythm.  Lungs: Normal work of breathing. Neurologic: No focal deficits.   Lab Results  Component Value Date   CREATININE 1.00 07/31/2021   BUN 17 07/31/2021   NA 138 07/31/2021   K 5.2 07/31/2021   CL 100 07/31/2021   CO2 22 07/31/2021   Lab Results  Component Value Date   ALT 8 07/31/2021   AST 11 07/31/2021   ALKPHOS 104 07/31/2021   BILITOT 0.2 07/31/2021   Lab Results  Component Value Date   HGBA1C 5.5 07/31/2021   HGBA1C 5.4 12/13/2020   HGBA1C 5.5 05/30/2015   Lab Results  Component Value Date   INSULIN 19.6 07/31/2021   INSULIN 21.4 12/13/2020   Lab Results  Component Value Date   TSH 4.200 12/13/2020   Lab Results  Component Value Date   CHOL 143 07/31/2021   HDL 37 (L) 07/31/2021   LDLCALC 80 07/31/2021   TRIG 149 07/31/2021   CHOLHDL 4.5 05/30/2015   Lab Results  Component Value Date   VD25OH 112.0 (H) 07/31/2021   VD25OH 28.4 (  L) 12/13/2020   Lab Results  Component Value Date   WBC 10.8 12/13/2020   HGB 13.6 12/13/2020   HCT 42.1 12/13/2020   MCV 92 12/13/2020   PLT 331 12/13/2020   No results found for: IRON, TIBC, FERRITIN  Attestation Statements:   Reviewed by clinician on day of visit: allergies, medications, problem list, medical history, surgical history, family history, social history, and previous encounter notes.  Time spent on visit including pre-visit  chart review and post-visit care and charting was 30 minutes.    I, Burt Knack, am acting as transcriptionist for Quillian Quince, MD.  I have reviewed the above documentation for accuracy and completeness, and I agree with the above. -  Quillian Quince, MD

## 2021-09-07 ENCOUNTER — Other Ambulatory Visit: Payer: Self-pay

## 2021-09-07 ENCOUNTER — Emergency Department (HOSPITAL_COMMUNITY): Payer: 59

## 2021-09-07 ENCOUNTER — Encounter (HOSPITAL_COMMUNITY): Payer: Self-pay | Admitting: Emergency Medicine

## 2021-09-07 ENCOUNTER — Emergency Department (HOSPITAL_COMMUNITY)
Admission: EM | Admit: 2021-09-07 | Discharge: 2021-09-07 | Disposition: A | Discharge status: Home / Self Care | Payer: 59 | Attending: Emergency Medicine | Admitting: Emergency Medicine

## 2021-09-07 DIAGNOSIS — R220 Localized swelling, mass and lump, head: Secondary | ICD-10-CM | POA: Insufficient documentation

## 2021-09-07 DIAGNOSIS — L03211 Cellulitis of face: Secondary | ICD-10-CM

## 2021-09-07 DIAGNOSIS — H5789 Other specified disorders of eye and adnexa: Secondary | ICD-10-CM | POA: Insufficient documentation

## 2021-09-07 DIAGNOSIS — Z91040 Latex allergy status: Secondary | ICD-10-CM | POA: Diagnosis not present

## 2021-09-07 DIAGNOSIS — J45909 Unspecified asthma, uncomplicated: Secondary | ICD-10-CM | POA: Insufficient documentation

## 2021-09-07 DIAGNOSIS — Z7984 Long term (current) use of oral hypoglycemic drugs: Secondary | ICD-10-CM | POA: Insufficient documentation

## 2021-09-07 DIAGNOSIS — Z7951 Long term (current) use of inhaled steroids: Secondary | ICD-10-CM | POA: Diagnosis not present

## 2021-09-07 LAB — CBC WITH DIFFERENTIAL/PLATELET
Abs Immature Granulocytes: 0.16 K/uL — ABNORMAL HIGH (ref 0.00–0.07)
Basophils Absolute: 0.1 K/uL (ref 0.0–0.1)
Basophils Relative: 1 %
Eosinophils Absolute: 0.3 K/uL (ref 0.0–0.5)
Eosinophils Relative: 3 %
HCT: 42.8 % (ref 36.0–46.0)
Hemoglobin: 14.2 g/dL (ref 12.0–15.0)
Immature Granulocytes: 2 %
Lymphocytes Relative: 27 %
Lymphs Abs: 2.7 K/uL (ref 0.7–4.0)
MCH: 31 pg (ref 26.0–34.0)
MCHC: 33.2 g/dL (ref 30.0–36.0)
MCV: 93.4 fL (ref 80.0–100.0)
Monocytes Absolute: 0.8 K/uL (ref 0.1–1.0)
Monocytes Relative: 8 %
Neutro Abs: 6 K/uL (ref 1.7–7.7)
Neutrophils Relative %: 59 %
Platelets: 276 K/uL (ref 150–400)
RBC: 4.58 MIL/uL (ref 3.87–5.11)
RDW: 12.5 % (ref 11.5–15.5)
WBC: 9.9 K/uL (ref 4.0–10.5)
nRBC: 0 % (ref 0.0–0.2)

## 2021-09-07 LAB — BASIC METABOLIC PANEL WITH GFR
Anion gap: 9 (ref 5–15)
BUN: 14 mg/dL (ref 6–20)
CO2: 26 mmol/L (ref 22–32)
Calcium: 9.1 mg/dL (ref 8.9–10.3)
Chloride: 103 mmol/L (ref 98–111)
Creatinine, Ser: 1.01 mg/dL — ABNORMAL HIGH (ref 0.44–1.00)
GFR, Estimated: >60 mL/min
Glucose, Bld: 88 mg/dL (ref 70–99)
Potassium: 4.1 mmol/L (ref 3.5–5.1)
Sodium: 138 mmol/L (ref 135–145)

## 2021-09-07 MED ORDER — IOHEXOL 300 MG/ML  SOLN
100.0000 mL | Freq: Once | INTRAMUSCULAR | Status: AC | PRN
  Administered 2021-09-07: 100 mL via INTRAVENOUS

## 2021-09-07 MED ORDER — CLINDAMYCIN HCL 150 MG PO CAPS: 300.0000 mg | ORAL_CAPSULE | Freq: Three times a day (TID) | ORAL | 0 refills | Status: AC

## 2021-09-07 MED ORDER — CLINDAMYCIN HCL 150 MG PO CAPS
300.0000 mg | ORAL_CAPSULE | Freq: Once | ORAL | Status: AC
  Administered 2021-09-07: 300 mg via ORAL
  Filled 2021-09-07: qty 2

## 2021-09-07 MED ORDER — FLUORESCEIN SODIUM 1 MG OP STRP
1.0000 | ORAL_STRIP | Freq: Once | OPHTHALMIC | Status: AC
  Administered 2021-09-07: 1 via OPHTHALMIC
  Filled 2021-09-07: qty 1

## 2021-09-07 MED ORDER — TETRACAINE HCL 0.5 % OP SOLN
2.0000 [drp] | Freq: Once | OPHTHALMIC | Status: AC
  Administered 2021-09-07: 2 [drp] via OPHTHALMIC
  Filled 2021-09-07: qty 4

## 2021-09-07 NOTE — Discharge Instructions (Addendum)
Your CT today showed a cellulitis, but no evidence of orbital involvement or drainable abscess.  Your antibiotic is being changed to clindamycin.  Take this as prescribed until finished.  We recommend that you take this antibiotic with an over-the-counter probiotic to prevent side effects such as diarrhea.  Have your cellulitis rechecked by your primary care doctor in 1 week.  You may return for new or concerning symptoms.

## 2021-09-07 NOTE — ED Triage Notes (Signed)
Pt states she was seen at [facility] on Thursday and diagnosed with cellulitis above L eye.  Taking antibiotics and it hasn't improved.  Symptoms started Tuesday.

## 2021-09-07 NOTE — ED Provider Notes (Signed)
Emergency Medicine Provider Triage Evaluation Note  Sarah Fields , a 45 y.o. female  was evaluated in triage.  Pt complains of preseptal cellulitis to the left eye.  Started 2 days ago, she is not on cefdinir twice daily.  No pain with eye movements, but states the swelling has not improved despite the antibiotics.  No vision changes..  Review of Systems  Positive: Left eye swelling, left eye pain Negative: Pain with EOMs, visual change  Physical Exam  BP (!) 150/108 (BP Location: Right Arm)   Pulse 91   Temp 99.4 F (37.4 C) (Oral)   Resp 16   LMP 08/08/2021   SpO2 100%  Gen:   Awake, no distress   Resp:  Normal effort  MSK:   Moves extremities without difficulty  Other:  Left eye swelling, periorbital cellulitis.  EOMI without nystagmus.  Medical Decision Making  Medically screening exam initiated at 10:27 AM.  Appropriate orders placed.  Sarah Fields was informed that the remainder of the evaluation will be completed by another provider, this initial triage assessment does not replace that evaluation, and the importance of remaining in the ED until their evaluation is complete.  Suspect preseptal cellulitis.  Does not appear to have progressed orbital, no pain with EOMs.  But is not improving with antibiotics.   Sarah Arista, PA-C 09/07/21 1032    Sarah Core, MD 09/07/21 1055

## 2021-09-07 NOTE — ED Provider Notes (Signed)
5:00 PM CT findings reviewed which show mild edema to the left temple and left preseptal orbit compatible with cellulitis.  No findings of orbital cellulitis or drainable fluid collection.  Will change antibiotic to clindamycin.  Advised use of probiotic while taking this medication to prevent diarrheal side effects.  Encouraged return for new or concerning symptoms.  Patient discharged in stable condition.   Antony Madura, PA-C 09/07/21 1752    Tegeler, Canary Brim, MD 09/07/21 2351

## 2021-09-07 NOTE — ED Provider Notes (Signed)
[facility] EMERGENCY DEPARTMENT Provider Note   CSN: [id number] Arrival date & time: 09/07/21  1021     History No chief complaint on file.   Sarah Fields is a 45 y.o. female presenting for evaluation of left eye swelling and pain.  Patient states for the past several days she has had pain and swelling around her left eye.  It began as a small bump on her temporal region, extensive spread.  A few days ago she was at urgent care, diagnosed with preseptal cellulitis and put on antibiotics.  She was told if it does not improve within 48 hours to come to the ER for further evaluation.  Patient reports symptoms are not improving, but also not worsening significantly.  No fevers or chills.  She denies vision loss, but states she occasionally has blurry vision in the left eye.  She denies pain of the eyeball itself, only in the surrounding tissues.  No pain with movement of her eyes.  She does not wear contacts.  No history of diabetes, she is not immunocompromised.  She has been taking the antibiotics as prescribed.  Not taking anything else for her symptoms.  Nothing makes the pain better or worse.  Does not radiate.  HPI     Past Medical History:  Diagnosis Date  . Allergy   . Anxiety   . Asthma   . Broken foot   . Depression   . Essential tremor   . Gestational diabetes    h/o  . Joint pain   . Migraines    with aura  . Other fatigue   . Shortness of breath   . Shortness of breath on exertion   . Tenosynovitis, de Quervain   . Vitamin D deficiency     Patient Active Problem List   Diagnosis Date Noted  . Encounter for general adult medical examination with abnormal findings 09/01/2021  . Low HDL (under 40) 09/01/2021  . Body mass index (BMI) of 40.1-44.9 in adult (HCC) 09/01/2021  . Screening for colon cancer 09/01/2021  . Need for Tdap vaccination 09/01/2021  . Need for influenza vaccination 09/01/2021  . Encounter to establish care  08/04/2021  . Intractable migraine with aura without status migrainosus 08/04/2021  . Insulin resistance 03/21/2021  . Vitamin D deficiency 03/21/2021  . At risk for malnutrition 03/21/2021  . Essential tremor 03/13/2020  . Asthma attack 01/19/2020  . Asthma 01/19/2020  . Acute respiratory failure with hypoxia (HCC)   . Hypertriglyceridemia 05/31/2015    Past Surgical History:  Procedure Laterality Date  . CESAREAN SECTION     2001, 2003  . KNEE SURGERY Bilateral    arthroscopic  . TUBAL LIGATION    . WISDOM TOOTH EXTRACTION       OB History     Gravida  2   Para  2   Term  2   Preterm      AB      Living  2      SAB      IAB      Ectopic      Multiple      Live Births              Family History  Problem Relation Age of Onset  . Hyperlipidemia Mother   . Migraines Mother   . Thyroid disease Mother   . Glaucoma Mother   . Osteoporosis Mother   . Anxiety disorder Mother   .  Seizures Father   . Hypertension Father   . Cancer Father 72       Prostate Ca  . Glaucoma Father   . Stroke Maternal Grandmother   . Cancer Maternal Grandfather   . Migraines Maternal Grandfather   . Stroke Paternal Grandfather     Social History   Tobacco Use  . Smoking status: Never  . Smokeless tobacco: Never  Vaping Use  . Vaping Use: Never used  Substance Use Topics  . Alcohol use: Never    Alcohol/week: 0.0 standard drinks  . Drug use: Never    Home Medications Prior to Admission medications   Medication Sig Start Date End Date Taking? Authorizing Provider  albuterol (PROVENTIL HFA;VENTOLIN HFA) 108 (90 BASE) MCG/ACT inhaler Inhale 2 puffs into the lungs every 6 (six) hours as needed for wheezing or shortness of breath.     [provider]  Azelastine-Fluticasone 137-50 MCG/ACT SUSP Place 1 spray into both nostrils 2 (two) times daily. 03/09/20   [provider]  buPROPion (WELLBUTRIN SR) 150 MG 12 hr tablet Take 1 tablet (150 mg  total) by mouth in the morning. 08/22/21   Quillian Quince D, MD  Calcium Carbonate-Vitamin D 600-200 MG-UNIT TABS Take 1 tablet by mouth daily.    [provider]  cefdinir (OMNICEF) 300 MG capsule Take 1 capsule (300 mg total) by mouth 2 (two) times daily for 10 days. 09/05/21 09/15/21  Lance Muss, FNP  EPINEPHrine 0.3 mg/0.3 mL IJ SOAJ injection INJECT IN OUTER THIGH AS NEEDED FOR ANAPHYLAXIS 02/15/20   [provider]  fluticasone (FLONASE) 50 MCG/ACT nasal spray Place 2 sprays into both nostrils daily.    [provider]  levocetirizine (XYZAL) 5 MG tablet SMARTSIG:1 Tablet(s) By Mouth Every Evening 02/09/20   [provider]  Magnesium 400 MG CAPS Take 1 tablet by mouth daily.    [provider]  metFORMIN (GLUCOPHAGE) 500 MG tablet Take 1 tablet (500 mg total) by mouth daily with breakfast. 08/22/21   Quillian Quince D, MD  montelukast (SINGULAIR) 10 MG tablet Take 10 mg by mouth at bedtime.     [provider]  norethindrone (INCASSIA) 0.35 MG tablet Take 1 tablet (0.35 mg total) by mouth daily. 09/19/20   Patton Salles, MD  polyethylene glycol (MIRALAX / GLYCOLAX) 17 g packet Take 17 g by mouth daily. With increased water intake. 08/22/21   Quillian Quince D, MD  primidone (MYSOLINE) 50 MG tablet TAKE 1 TABLET BY MOUTH EVERYDAY AT BEDTIME 03/09/21   Anson Fret, MD  rizatriptan (MAXALT) 10 MG tablet Take 1 tablet (10 mg total) by mouth as needed for migraine. May repeat in 2 hours if needed 07/23/21   Carlean Jews, NP  sodium chloride (OCEAN) 0.65 % SOLN nasal spray Place 1 spray into both nostrils as needed for congestion. 01/20/20   Joseph Art, DO  SYMBICORT 160-4.5 MCG/ACT inhaler  03/09/20   [provider]  Vitamin D, Ergocalciferol, (DRISDOL) 1.25 MG (50000 UNIT) CAPS capsule TAKE 1 CAPSULE BY MOUTH ONCE EVERY 3 DAYS. 07/10/21   Quillian Quince D, MD    Allergies    Banana, Hydrocodone, Latex, Other,  Penicillins, Pineapple, Propranolol, Strawberry extract, Adhesive [tape], and Bactrim [sulfamethoxazole-trimethoprim]  Review of Systems   Review of Systems  Eyes:  Positive for pain.  All other systems reviewed and are negative.  Physical Exam Updated Vital Signs BP (!) 134/117   Pulse 80   Temp  99.4 F (37.4 C) (Oral)   Resp 18   LMP 08/08/2021   SpO2 98%   Physical Exam Vitals and nursing note reviewed.  Constitutional:      General: She is not in acute distress.    Appearance: Normal appearance.  HENT:     Head: Normocephalic and atraumatic.     Comments: No scabs or lesions in the ear canal or nose. Left sided preauricular lymphadenopathy Eyes:     Conjunctiva/sclera: Conjunctivae normal.     Pupils: Pupils are equal, round, and reactive to light.     Left eye: No corneal abrasion or fluorescein uptake.     Comments: See pictures below.  Mild swelling and erythema of the periorbital tissue of the left eye.  Lesion, which appears excoriated, over the left temple.  EOMI and PERRLA.  No fluorescein stain uptake or signs of dendritic lesions.  Neck:     Comments: Left-sided cervical lymphadenopathy Cardiovascular:     Rate and Rhythm: Normal rate and regular rhythm.     Pulses: Normal pulses.  Pulmonary:     Effort: Pulmonary effort is normal. No respiratory distress.     Breath sounds: Normal breath sounds. No wheezing.     Comments: Speaking in full sentences.  Clear lung sounds in all fields. Abdominal:     General: There is no distension.     Palpations: Abdomen is soft. There is no mass.     Tenderness: There is no abdominal tenderness. There is no guarding or rebound.  Musculoskeletal:        General: Normal range of motion.     Cervical back: Normal range of motion and neck supple.  Lymphadenopathy:     Cervical: Cervical adenopathy present.  Skin:    General: Skin is warm and dry.     Capillary Refill: Capillary refill takes less than 2 seconds.   Neurological:     Mental Status: She is alert and oriented to person, place, and time.  Psychiatric:        Mood and Affect: Mood and affect normal.        Speech: Speech normal.        Behavior: Behavior normal.         ED Results / Procedures / Treatments   Labs (all labs ordered are listed, but only abnormal results are displayed) Labs Reviewed  BASIC METABOLIC PANEL  CBC WITH DIFFERENTIAL/PLATELET    EKG None  Radiology No results found.  Procedures Procedures   Medications Ordered in ED Medications  fluorescein ophthalmic strip 1 strip (1 strip Left Eye Given 09/07/21 1437)  tetracaine (PONTOCAINE) 0.5 % ophthalmic solution 2 drop (2 drops Left Eye Given 09/07/21 1437)    ED Course  I have reviewed the triage vital signs and the nursing notes.  Pertinent labs & imaging results that were available during my care of the patient were reviewed by me and considered in my medical decision making (see chart for details).    MDM Rules/Calculators/A&P                           Patient presenting for evaluation of left thigh pain and swelling.  On exam, patient appears nontoxic.  She does have some mild swelling of the periocular tissue.  No pain of the eyeball itself.  No fluorescein stain uptake or dendritic lesions.  Doubt herpes virus/shingles.  Labs and CT scan ordered from triage are pending.  Pt  signed out to Carmie Kanner, PA-C for f/u and likely d/c with different abx  Final Clinical Impression(s) / ED Diagnoses Final diagnoses:  None    Rx / DC Orders ED Discharge Orders     None        Alveria Apley, PA-C 09/07/21 1452    Milagros Loll, MD 09/09/21 [phone]

## 2021-09-14 ENCOUNTER — Other Ambulatory Visit (INDEPENDENT_AMBULATORY_CARE_PROVIDER_SITE_OTHER): Payer: Self-pay | Admitting: Family Medicine

## 2021-09-14 DIAGNOSIS — E8881 Metabolic syndrome: Secondary | ICD-10-CM

## 2021-09-16 NOTE — Telephone Encounter (Signed)
LAST APPOINTMENT DATE: 09/05/21 NEXT APPOINTMENT DATE: 09/24/21   [id number] - [place], [place] - [address] [place] [address] Phone: [phone] Fax: [phone]  Patient is requesting a refill of the following medications: Requested Prescriptions   Pending Prescriptions Disp Refills   metFORMIN (GLUCOPHAGE) 500 MG tablet [Pharmacy Med Name: METFORMIN HCL 500 MG TABLET] 30 tablet 0    Sig: TAKE 1 TABLET BY MOUTH EVERY DAY WITH BREAKFAST    Date last filled: 08/22/21 Previously prescribed by Dr. Dalbert Garnet  Lab Results  Component Value Date   HGBA1C 5.5 07/31/2021   HGBA1C 5.4 12/13/2020   HGBA1C 5.5 05/30/2015   Lab Results  Component Value Date   LDLCALC 80 07/31/2021   CREATININE 1.01 (H) 09/07/2021   Lab Results  Component Value Date   VD25OH 112.0 (H) 07/31/2021   VD25OH 28.4 (L) 12/13/2020    BP Readings from Last 3 Encounters:  09/07/21 (!) 130/93  09/05/21 (!) 136/103  09/05/21 128/85

## 2021-09-16 NOTE — Telephone Encounter (Signed)
Last OV with Dr. Beasley 

## 2021-09-23 ENCOUNTER — Other Ambulatory Visit: Payer: Self-pay

## 2021-09-23 ENCOUNTER — Encounter: Payer: Self-pay | Admitting: Obstetrics and Gynecology

## 2021-09-23 ENCOUNTER — Ambulatory Visit (INDEPENDENT_AMBULATORY_CARE_PROVIDER_SITE_OTHER): Payer: 59 | Admitting: Obstetrics and Gynecology

## 2021-09-23 VITALS — BP 118/70 | HR 84 | Ht 63.5 in | Wt 262.0 lb

## 2021-09-23 DIAGNOSIS — Z01419 Encounter for gynecological examination (general) (routine) without abnormal findings: Secondary | ICD-10-CM | POA: Diagnosis not present

## 2021-09-23 MED ORDER — NORETHINDRONE 0.35 MG PO TABS: 1.0000 | ORAL_TABLET | Freq: Every day | ORAL | 3 refills | Status: DC

## 2021-09-23 NOTE — Patient Instructions (Signed)

## 2021-09-23 NOTE — Progress Notes (Signed)
45 y.o. G39P2002 Divorced Caucasian female here for annual exam.    Lost 30+ pounds through [facility].   Doing allergy shots and feeling well.   Skipped 04/2021 menses. Taking progesterone only birth control pills.   Not sexually active.   PCP:  Vincent Gros, NP  Patient's last menstrual period was 09/08/2021 (exact date).     Period Cycle (Days): 30 Period Duration (Days): 5 Period Pattern: Regular Menstrual Flow: Light Menstrual Control: Maxi pad Menstrual Control Change Freq (Hours): changes maxi pad every 4-6 hours on heaviest day Dysmenorrhea: (!) Mild (back pain/cramps with cycle)     Sexually active: No.  The current method of family planning is tubal ligation.    Exercising: No.   Arm strengthening, some stationary bike Smoker:  no  Health Maintenance: Pap:   07/02/17 Neg:Neg HR HPV, 05-18-14 Neg:Neg HR HPV History of abnormal Pap:  Yes, 2000 hx of colposcopy but no treatment to cervix. MMG:  09-16-19 Neg/BiRads1 Colonoscopy:  n/a BMD:   n/a  Result  n/a TDaP:  PCP Gardasil:   no HIV: 01-19-20 NR Hep C:never Screening Labs: [facility].  Flu vaccine:  completed.  Covid vaccine:  x 2.    reports that she has never smoked. She has never used smokeless tobacco. She reports that she does not drink alcohol and does not use drugs.  Past Medical History:  Diagnosis Date   Allergy    Anxiety    Asthma    Broken foot    Depression    Essential tremor    Gestational diabetes    h/o   History of COVID-19 04/24/2021   Joint pain    Migraines    with aura   Other fatigue    Shortness of breath    Shortness of breath on exertion    Tenosynovitis, de Quervain    Vitamin D deficiency     Past Surgical History:  Procedure Laterality Date   CESAREAN SECTION     2001, 2003   KNEE SURGERY Bilateral    arthroscopic   TUBAL LIGATION     WISDOM TOOTH EXTRACTION      Current Outpatient Medications  Medication Sig Dispense Refill    albuterol (PROVENTIL HFA;VENTOLIN HFA) 108 (90 BASE) MCG/ACT inhaler Inhale 2 puffs into the lungs every 6 (six) hours as needed for wheezing or shortness of breath.      Azelastine-Fluticasone 137-50 MCG/ACT SUSP Place 1 spray into both nostrils 2 (two) times daily.     buPROPion (WELLBUTRIN SR) 150 MG 12 hr tablet Take 1 tablet (150 mg total) by mouth in the morning. 30 tablet 0   EPINEPHrine 0.3 mg/0.3 mL IJ SOAJ injection INJECT IN OUTER THIGH AS NEEDED FOR ANAPHYLAXIS     fluticasone (FLONASE) 50 MCG/ACT nasal spray Place 2 sprays into both nostrils daily.     levocetirizine (XYZAL) 5 MG tablet SMARTSIG:1 Tablet(s) By Mouth Every Evening     Magnesium 400 MG CAPS Take 1 tablet by mouth daily.     metFORMIN (GLUCOPHAGE) 500 MG tablet TAKE 1 TABLET BY MOUTH EVERY DAY WITH BREAKFAST 30 tablet 0   montelukast (SINGULAIR) 10 MG tablet Take 10 mg by mouth at bedtime.      norethindrone (INCASSIA) 0.35 MG tablet Take 1 tablet (0.35 mg total) by mouth daily. 84 tablet 3   polyethylene glycol (MIRALAX / GLYCOLAX) 17 g packet Take 17 g by mouth daily. With increased water intake. 30 each 0   primidone (  MYSOLINE) 50 MG tablet TAKE 1 TABLET BY MOUTH EVERYDAY AT BEDTIME 90 tablet 4   rizatriptan (MAXALT) 10 MG tablet Take 1 tablet (10 mg total) by mouth as needed for migraine. May repeat in 2 hours if needed 10 tablet 5   sodium chloride (OCEAN) 0.65 % SOLN nasal spray Place 1 spray into both nostrils as needed for congestion.  0   SYMBICORT 160-4.5 MCG/ACT inhaler      Vitamin D, Ergocalciferol, (DRISDOL) 1.25 MG (50000 UNIT) CAPS capsule TAKE 1 CAPSULE BY MOUTH ONCE EVERY 3 DAYS. 10 capsule 0   No current facility-administered medications for this visit.    Family History  Problem Relation Age of Onset   Hyperlipidemia Mother    Migraines Mother    Thyroid disease Mother    Glaucoma Mother    Osteoporosis Mother    Anxiety disorder Mother    Seizures Father    Hypertension Father    Cancer  Father 15       Prostate Ca   Glaucoma Father    Stroke Maternal Grandmother    Cancer Maternal Grandfather    Migraines Maternal Grandfather    Stroke Paternal Grandfather     Review of Systems  All other systems reviewed and are negative.  Exam:   BP 118/70   Pulse 84   Ht 5' 3.5" (1.613 m)   Wt 262 lb (118.8 kg)   LMP 09/08/2021 (Exact Date)   SpO2 98%   BMI 45.68 kg/m     General appearance: alert, cooperative and appears stated age Head: normocephalic, without obvious abnormality, atraumatic Neck: no adenopathy, supple, symmetrical, trachea midline and thyroid normal to inspection and palpation Lungs: clear to auscultation bilaterally Breasts: normal appearance, no masses or tenderness, No nipple retraction or dimpling, No nipple discharge or bleeding, No axillary adenopathy Heart: regular rate and rhythm Abdomen: soft, non-tender; no masses, no organomegaly Extremities: extremities normal, atraumatic, no cyanosis or edema Skin: skin color, texture, turgor normal. No rashes or lesions Lymph nodes: cervical, supraclavicular, and axillary nodes normal. Neurologic: grossly normal  Pelvic: External genitalia:  no lesions              No abnormal inguinal nodes palpated.              Urethra:  normal appearing urethra with no masses, tenderness or lesions              Bartholins and Skenes: normal                 Vagina: normal appearing vagina with normal color and discharge, no lesions              Cervix: no lesions.  Brown cervical mucus at os.              Pap taken: no Bimanual Exam:  Uterus:  normal size, contour, position, consistency, mobility, non-tender              Adnexa: no mass, fullness, tenderness              Rectal exam: yes.  Confirms.              Anus:  normal sphincter tone, no lesions  Chaperone was present for exam:  Marchelle Folks, CMA.  Assessment:   Well woman visit with gynecologic exam. Status post BTL.  On Micronor for menorrhagia. Hx  cryotherapy.  Migraines with aura.  HA with exercise.  Obesity.   Successful weight loss.  Plan: Mammogram screening discussed.  She will schedule a mammogram.  Self breast awareness reviewed. Pap and HR HPV 2023. Guidelines for Calcium, Vitamin D, regular exercise program including cardiovascular and weight bearing exercise. Rx for Micronor for one year.  Labs with PCP and [facility] group.  Congratulations on successful weight loss! Follow up annually and prn.   After visit summary provided.

## 2021-09-24 ENCOUNTER — Encounter (INDEPENDENT_AMBULATORY_CARE_PROVIDER_SITE_OTHER): Payer: Self-pay | Admitting: Family Medicine

## 2021-09-24 ENCOUNTER — Other Ambulatory Visit: Payer: Self-pay

## 2021-09-24 ENCOUNTER — Ambulatory Visit (INDEPENDENT_AMBULATORY_CARE_PROVIDER_SITE_OTHER): Payer: 59 | Admitting: Family Medicine

## 2021-09-24 VITALS — BP 121/83 | HR 86 | Temp 98.7°F | Ht 64.0 in | Wt 258.0 lb

## 2021-09-24 DIAGNOSIS — Z9189 Other specified personal risk factors, not elsewhere classified: Secondary | ICD-10-CM

## 2021-09-24 DIAGNOSIS — F3289 Other specified depressive episodes: Secondary | ICD-10-CM

## 2021-09-24 DIAGNOSIS — E8881 Metabolic syndrome: Secondary | ICD-10-CM

## 2021-09-24 DIAGNOSIS — Z6841 Body Mass Index (BMI) 40.0 and over, adult: Secondary | ICD-10-CM

## 2021-09-24 MED ORDER — METFORMIN HCL 500 MG PO TABS: ORAL_TABLET | ORAL | 0 refills | Status: DC

## 2021-09-24 MED ORDER — BUPROPION HCL ER (SR) 150 MG PO TB12: 150.0000 mg | ORAL_TABLET | Freq: Every morning | ORAL | 0 refills | Status: DC

## 2021-09-24 NOTE — Progress Notes (Signed)
Chief Complaint:   OBESITY Sarah Fields is here to discuss her progress with her obesity treatment plan along with follow-up of her obesity related diagnoses. Sarah Fields is on the Category 3 Plan and states she is following her eating plan approximately 90% of the time. Sarah Fields states she is doing 0 minutes 0 times per week.  Today's visit was #: 18 Starting weight: 295 lbs Starting date: 12/13/2020 Today's weight: 258 lbs Today's date: 09/24/2021 Total lbs lost to date: 37 Total lbs lost since last in-office visit: 1  Interim History: Sarah Fields continues to do well with weight loss on her eating plan. Her hunger is mostly controlled. She is working on not skipping meals, but sometimes struggles with breakfast.  Subjective:   1. Insulin resistance Sarah Fields is stable on metformin, and she is doing well with diet and exercise. She notes decreased polyphagia.  2. Other depression with emotional eating Sarah Fields is stable on Wellbutrin, and she is working on decreasing emotional eating behaviors.  3. At risk for diabetes mellitus Sarah Fields is at higher than average risk for developing diabetes due to obesity.   Assessment/Plan:   1. Insulin resistance Sarah Fields will continue to work on weight loss, exercise, and decreasing simple carbohydrates to help decrease the risk of diabetes. We will refill metformin for 1 month. Litisha agreed to follow-up with [place] as directed to closely monitor her progress.  - metFORMIN (GLUCOPHAGE) 500 MG tablet; TAKE 1 TABLET BY MOUTH EVERY DAY WITH BREAKFAST  Dispense: 30 tablet; Refill: 0  2. Other depression with emotional eating Behavior modification techniques were discussed today to help Sarah Fields deal with her emotional/non-hunger eating behaviors. We will refill Wellbutrin SR for 1 month. Orders and follow up as documented in patient record.   - buPROPion (WELLBUTRIN SR) 150 MG 12 hr tablet; Take 1 tablet (150 mg total) by mouth in the morning.   Dispense: 30 tablet; Refill: 0  3. At risk for diabetes mellitus Sarah Fields was given approximately 15 minutes of diabetes education and counseling today. We discussed intensive lifestyle modifications today with an emphasis on weight loss as well as increasing exercise and decreasing simple carbohydrates in her diet. We also reviewed medication options with an emphasis on risk versus benefit of those discussed.   Repetitive spaced learning was employed today to elicit superior memory formation and behavioral change.  4. Obesity with current BMI of 44.3 Sarah Fields is currently in the action stage of change. As such, her goal is to continue with weight loss efforts. She has agreed to the Category 3 Plan and keeping a food journal and adhering to recommended goals of 300 calories and 20+ grams of protein at breakfast daily.   Behavioral modification strategies: increasing lean protein intake and meal planning and cooking strategies.  Sarah Fields has agreed to follow-up with our clinic in 3 to 4 weeks. She was informed of the importance of frequent follow-up visits to maximize her success with intensive lifestyle modifications for her multiple health conditions.   Objective:   Blood pressure 121/83, pulse 86, temperature 98.7 F (37.1 C), height 5\' 4"  (1.626 m), weight 258 lb (117 kg), last menstrual period 09/08/2021, SpO2 98 %. Body mass index is 44.29 kg/m.  General: Cooperative, alert, well developed, in no acute distress. HEENT: Conjunctivae and lids unremarkable. Cardiovascular: Regular rhythm.  Lungs: Normal work of breathing. Neurologic: No focal deficits.   Lab Results  Component Value Date   CREATININE 1.01 (H) 09/07/2021   BUN 14 09/07/2021   NA  138 09/07/2021   K 4.1 09/07/2021   CL 103 09/07/2021   CO2 26 09/07/2021   Lab Results  Component Value Date   ALT 8 07/31/2021   AST 11 07/31/2021   ALKPHOS 104 07/31/2021   BILITOT 0.2 07/31/2021   Lab Results  Component Value  Date   HGBA1C 5.5 07/31/2021   HGBA1C 5.4 12/13/2020   HGBA1C 5.5 05/30/2015   Lab Results  Component Value Date   INSULIN 19.6 07/31/2021   INSULIN 21.4 12/13/2020   Lab Results  Component Value Date   TSH 4.200 12/13/2020   Lab Results  Component Value Date   CHOL 143 07/31/2021   HDL 37 (L) 07/31/2021   LDLCALC 80 07/31/2021   TRIG 149 07/31/2021   CHOLHDL 4.5 05/30/2015   Lab Results  Component Value Date   VD25OH 112.0 (H) 07/31/2021   VD25OH 28.4 (L) 12/13/2020   Lab Results  Component Value Date   WBC 9.9 09/07/2021   HGB 14.2 09/07/2021   HCT 42.8 09/07/2021   MCV 93.4 09/07/2021   PLT 276 09/07/2021   No results found for: IRON, TIBC, FERRITIN  Attestation Statements:   Reviewed by clinician on day of visit: allergies, medications, problem list, medical history, surgical history, family history, social history, and previous encounter notes.   I, Burt Knack, am acting as transcriptionist for Quillian Quince, MD.  I have reviewed the above documentation for accuracy and completeness, and I agree with the above. -  Quillian Quince, MD

## 2021-09-30 ENCOUNTER — Other Ambulatory Visit: Payer: Self-pay | Admitting: Obstetrics and Gynecology

## 2021-09-30 DIAGNOSIS — Z1231 Encounter for screening mammogram for malignant neoplasm of breast: Secondary | ICD-10-CM

## 2021-10-20 ENCOUNTER — Other Ambulatory Visit (INDEPENDENT_AMBULATORY_CARE_PROVIDER_SITE_OTHER): Payer: Self-pay | Admitting: Family Medicine

## 2021-10-20 DIAGNOSIS — E8881 Metabolic syndrome: Secondary | ICD-10-CM

## 2021-10-21 ENCOUNTER — Encounter (INDEPENDENT_AMBULATORY_CARE_PROVIDER_SITE_OTHER): Payer: Self-pay | Admitting: Family Medicine

## 2021-10-21 ENCOUNTER — Ambulatory Visit (INDEPENDENT_AMBULATORY_CARE_PROVIDER_SITE_OTHER): Payer: 59 | Admitting: Family Medicine

## 2021-10-21 ENCOUNTER — Other Ambulatory Visit: Payer: Self-pay

## 2021-10-21 VITALS — BP 109/77 | HR 93 | Temp 98.7°F | Ht 64.0 in | Wt 257.0 lb

## 2021-10-21 DIAGNOSIS — F3289 Other specified depressive episodes: Secondary | ICD-10-CM | POA: Diagnosis not present

## 2021-10-21 DIAGNOSIS — Z9189 Other specified personal risk factors, not elsewhere classified: Secondary | ICD-10-CM

## 2021-10-21 DIAGNOSIS — R7303 Prediabetes: Secondary | ICD-10-CM | POA: Diagnosis not present

## 2021-10-21 DIAGNOSIS — Z6841 Body Mass Index (BMI) 40.0 and over, adult: Secondary | ICD-10-CM

## 2021-10-21 MED ORDER — METFORMIN HCL 500 MG PO TABS: ORAL_TABLET | ORAL | 0 refills | Status: DC

## 2021-10-21 MED ORDER — BUPROPION HCL ER (SR) 150 MG PO TB12: 150.0000 mg | ORAL_TABLET | Freq: Every morning | ORAL | 0 refills | Status: DC

## 2021-10-21 NOTE — Telephone Encounter (Signed)
Pt last seen by Dr. Beasley.  

## 2021-10-21 NOTE — Progress Notes (Signed)
Chief Complaint:   OBESITY Sarah Fields is here to discuss her progress with her obesity treatment plan along with follow-up of her obesity related diagnoses. [name] is on the Category 3 Plan and keeping a food journal and adhering to recommended goals of 300 calories and 20+ grams of protein at breakfast daily and states she is following her eating plan approximately 90% of the time. [name] states she is doing 0 minutes 0 times per week.  Today's visit was #: 19 Starting weight: 295 lbs Starting date: 12/13/2020 Today's weight: 257 lbs Today's date: 10/21/2021 Total lbs lost to date: 38 Total lbs lost since last in-office visit: 1  Interim History: Sarah Fields continues to do well with diet and weight loss even over Thanksgiving. Her hunger is mostly controlled and she did well with portion control without feeling deprived. She would like to discuss "crunchy" snack ideas.  Subjective:   1. Pre-diabetes Sarah Fields continues to work on decreasing simple carbohydrates and portion control to help prevent diabetes mellitus. She has no problems with metformin.  2. Other depression with emotional eating Sarah Fields is doing well on her medications. She denies feeling deprived, and she has done well with minimizing emotional eating behaviors.  3. At risk for diabetes mellitus Sarah Fields is at higher than average risk for developing diabetes due to obesity.   Assessment/Plan:   1. Pre-diabetes Zanita will continue to work on weight loss, exercise, and decreasing simple carbohydrates to help decrease the risk of diabetes. We will refill metformin for 1 month.  - metFORMIN (GLUCOPHAGE) 500 MG tablet; TAKE 1 TABLET BY MOUTH EVERY DAY WITH BREAKFAST  Dispense: 30 tablet; Refill: 0  2. Other depression with emotional eating Behavior modification techniques were discussed today to help Nadean deal with her emotional/non-hunger eating behaviors. We will refill Wellbutrin SR for 1 month. Orders  and follow up as documented in patient record.   - buPROPion (WELLBUTRIN SR) 150 MG 12 hr tablet; Take 1 tablet (150 mg total) by mouth in the morning.  Dispense: 30 tablet; Refill: 0  3. At risk for diabetes mellitus Sarah Fields was given approximately 15 minutes of diabetes education and counseling today. We discussed intensive lifestyle modifications today with an emphasis on weight loss as well as increasing exercise and decreasing simple carbohydrates in her diet. We also reviewed medication options with an emphasis on risk versus benefit of those discussed.   Repetitive spaced learning was employed today to elicit superior memory formation and behavioral change.  4. Obesity BMI today is 57 Sarah Fields is currently in the action stage of change. As such, her goal is to continue with weight loss efforts. She has agreed to the Category 3 Plan.   I recommended 100 calorie microwave popcorn, rice cakes, raw veggies, and freezer dried okra as crunchy snack options. She agreed to try some of these options.  Behavioral modification strategies: better snacking choices.  Sarah Fields has agreed to follow-up with our clinic in 3 weeks. She was informed of the importance of frequent follow-up visits to maximize her success with intensive lifestyle modifications for her multiple health conditions.   Objective:   Blood pressure 109/77, pulse 93, temperature 98.7 F (37.1 C), height 5\' 4"  (1.626 m), weight 257 lb (116.6 kg), SpO2 98 %. Body mass index is 44.11 kg/m.  General: Cooperative, alert, well developed, in no acute distress. HEENT: Conjunctivae and lids unremarkable. Cardiovascular: Regular rhythm.  Lungs: Normal work of breathing. Neurologic: No focal deficits.   Lab Results  Component  Value Date   CREATININE 1.01 (H) 09/07/2021   BUN 14 09/07/2021   NA 138 09/07/2021   K 4.1 09/07/2021   CL 103 09/07/2021   CO2 26 09/07/2021   Lab Results  Component Value Date   ALT 8 07/31/2021    AST 11 07/31/2021   ALKPHOS 104 07/31/2021   BILITOT 0.2 07/31/2021   Lab Results  Component Value Date   HGBA1C 5.5 07/31/2021   HGBA1C 5.4 12/13/2020   HGBA1C 5.5 05/30/2015   Lab Results  Component Value Date   INSULIN 19.6 07/31/2021   INSULIN 21.4 12/13/2020   Lab Results  Component Value Date   TSH 4.200 12/13/2020   Lab Results  Component Value Date   CHOL 143 07/31/2021   HDL 37 (L) 07/31/2021   LDLCALC 80 07/31/2021   TRIG 149 07/31/2021   CHOLHDL 4.5 05/30/2015   Lab Results  Component Value Date   VD25OH 112.0 (H) 07/31/2021   VD25OH 28.4 (L) 12/13/2020   Lab Results  Component Value Date   WBC 9.9 09/07/2021   HGB 14.2 09/07/2021   HCT 42.8 09/07/2021   MCV 93.4 09/07/2021   PLT 276 09/07/2021   No results found for: IRON, TIBC, FERRITIN  Attestation Statements:   Reviewed by clinician on day of visit: allergies, medications, problem list, medical history, surgical history, family history, social history, and previous encounter notes.   I, Burt Knack, am acting as transcriptionist for Quillian Quince, MD.  I have reviewed the above documentation for accuracy and completeness, and I agree with the above. -  Quillian Quince, MD

## 2021-10-26 ENCOUNTER — Other Ambulatory Visit (INDEPENDENT_AMBULATORY_CARE_PROVIDER_SITE_OTHER): Payer: Self-pay | Admitting: Family Medicine

## 2021-10-26 DIAGNOSIS — R7303 Prediabetes: Secondary | ICD-10-CM

## 2021-10-28 NOTE — Telephone Encounter (Signed)
Pt requesting a 90 day refill

## 2021-11-12 ENCOUNTER — Other Ambulatory Visit: Payer: Self-pay

## 2021-11-12 ENCOUNTER — Encounter (INDEPENDENT_AMBULATORY_CARE_PROVIDER_SITE_OTHER): Payer: Self-pay | Admitting: Family Medicine

## 2021-11-12 ENCOUNTER — Ambulatory Visit (INDEPENDENT_AMBULATORY_CARE_PROVIDER_SITE_OTHER): Payer: 59 | Admitting: Family Medicine

## 2021-11-12 ENCOUNTER — Encounter

## 2021-11-12 VITALS — BP 122/81 | HR 92 | Temp 98.4°F | Ht 64.0 in | Wt 255.0 lb

## 2021-11-12 DIAGNOSIS — K5909 Other constipation: Secondary | ICD-10-CM | POA: Diagnosis not present

## 2021-11-12 DIAGNOSIS — F3289 Other specified depressive episodes: Secondary | ICD-10-CM

## 2021-11-12 DIAGNOSIS — Z9189 Other specified personal risk factors, not elsewhere classified: Secondary | ICD-10-CM

## 2021-11-12 DIAGNOSIS — Z6841 Body Mass Index (BMI) 40.0 and over, adult: Secondary | ICD-10-CM

## 2021-11-12 DIAGNOSIS — R7303 Prediabetes: Secondary | ICD-10-CM

## 2021-11-12 MED ORDER — BUPROPION HCL ER (SR) 150 MG PO TB12: 150.0000 mg | ORAL_TABLET | Freq: Every morning | ORAL | 0 refills | Status: DC

## 2021-11-12 MED ORDER — POLYETHYLENE GLYCOL 3350 17 G PO PACK: 17.0000 g | PACK | Freq: Every day | ORAL | 0 refills | Status: DC

## 2021-11-12 MED ORDER — METFORMIN HCL 500 MG PO TABS: ORAL_TABLET | ORAL | 0 refills | Status: DC

## 2021-11-12 NOTE — Progress Notes (Signed)
Chief Complaint:   OBESITY Sarah Fields is here to discuss her progress with her obesity treatment plan along with follow-up of her obesity related diagnoses. Sarah Fields is on the Category 3 Plan and states she is following her eating plan approximately 80% of the time. Sarah Fields states she is doing 0 minutes 0 times per week.  Today's visit was #: 20 Starting weight: 295 lbs Starting date: 12/13/2020 Today's weight: 255 lbs Today's date: 11/12/2021 Total lbs lost to date: 40 Total lbs lost since last in-office visit: 2  Interim History: Sarah Fields continues to do well with weight loss despite extra challenges. She notes work has been hectic and meal planning has been difficult.  Subjective:   1. Pre-diabetes Sarah Fields continues to work on diet and exercise. She has no problems with nutrition. No side effects were noted.  2. Other constipation Sarah Fields is stable on miralax and her symptoms have improved. No side effects were noted.  3. Other depression with emotional eating Sarah Fields has had increased stress with making deadlines, but she has been able to minimizing emotional eating behaviors. She is stable on Wellbutrin.  4. At risk for heart disease Sarah Fields is at a higher than average risk for cardiovascular disease due to obesity.   Assessment/Plan:   1. Pre-diabetes We will refill metformin for 1 month. Sarah Fields will continue to work on weight loss, exercise, and decreasing simple carbohydrates to help decrease the risk of diabetes.   - metFORMIN (GLUCOPHAGE) 500 MG tablet; TAKE 1 TABLET BY MOUTH EVERY DAY WITH BREAKFAST  Dispense: 30 tablet; Refill: 0  2. Other constipation We will refill miralax for 1 month. Sarah Fields was informed that a decrease in bowel movement frequency is normal while losing weight, but stools should not be hard or painful. Orders and follow up as documented in patient record.   - polyethylene glycol (MIRALAX / GLYCOLAX) 17 g packet; Take 17 g by mouth  daily. With increased water intake.  Dispense: 30 each; Refill: 0  3. Other depression with emotional eating Behavior modification techniques were discussed today to help San deal with her emotional/non-hunger eating behaviors. We will refill Wellbutrin SR for 1 month. Orders and follow up as documented in patient record.   - buPROPion (WELLBUTRIN SR) 150 MG 12 hr tablet; Take 1 tablet (150 mg total) by mouth in the morning.  Dispense: 30 tablet; Refill: 0  4. At risk for heart disease Sarah Fields was given approximately 15 minutes of coronary artery disease prevention counseling today. She is 45 y.o. female and has risk factors for heart disease including obesity. We discussed intensive lifestyle modifications today with an emphasis on specific weight loss instructions and strategies.   Repetitive spaced learning was employed today to elicit superior memory formation and behavioral change.  5. Obesity with current BMI of 43.8 Sarah Fields is currently in the action stage of change. As such, her goal is to continue with weight loss efforts. She has agreed to the Category 3 Plan.   Behavioral modification strategies: increasing lean protein intake, meal planning and cooking strategies, and holiday eating strategies .  Sarah Fields has agreed to follow-up with our clinic in 3 weeks. She was informed of the importance of frequent follow-up visits to maximize her success with intensive lifestyle modifications for her multiple health conditions.   Objective:   Blood pressure 122/81, pulse 92, temperature 98.4 F (36.9 C), height 5\' 4"  (1.626 m), weight 255 lb (115.7 kg), SpO2 99 %. Body mass index is 43.77 kg/m.  General: Cooperative, alert, well developed, in no acute distress. HEENT: Conjunctivae and lids unremarkable. Cardiovascular: Regular rhythm.  Lungs: Normal work of breathing. Neurologic: No focal deficits.   Lab Results  Component Value Date   CREATININE 1.01 (H) 09/07/2021   BUN 14  09/07/2021   NA 138 09/07/2021   K 4.1 09/07/2021   CL 103 09/07/2021   CO2 26 09/07/2021   Lab Results  Component Value Date   ALT 8 07/31/2021   AST 11 07/31/2021   ALKPHOS 104 07/31/2021   BILITOT 0.2 07/31/2021   Lab Results  Component Value Date   HGBA1C 5.5 07/31/2021   HGBA1C 5.4 12/13/2020   HGBA1C 5.5 05/30/2015   Lab Results  Component Value Date   INSULIN 19.6 07/31/2021   INSULIN 21.4 12/13/2020   Lab Results  Component Value Date   TSH 4.200 12/13/2020   Lab Results  Component Value Date   CHOL 143 07/31/2021   HDL 37 (L) 07/31/2021   LDLCALC 80 07/31/2021   TRIG 149 07/31/2021   CHOLHDL 4.5 05/30/2015   Lab Results  Component Value Date   VD25OH 112.0 (H) 07/31/2021   VD25OH 28.4 (L) 12/13/2020   Lab Results  Component Value Date   WBC 9.9 09/07/2021   HGB 14.2 09/07/2021   HCT 42.8 09/07/2021   MCV 93.4 09/07/2021   PLT 276 09/07/2021   No results found for: IRON, TIBC, FERRITIN  Attestation Statements:   Reviewed by clinician on day of visit: allergies, medications, problem list, medical history, surgical history, family history, social history, and previous encounter notes.   I, Burt Knack, am acting as transcriptionist for Quillian Quince, MD.  I have reviewed the above documentation for accuracy and completeness, and I agree with the above. -  Quillian Quince, MD

## 2021-11-19 ENCOUNTER — Ambulatory Visit
Admission: RE | Admit: 2021-11-19 | Discharge: 2021-11-19 | Disposition: A | Discharge status: Home / Self Care | Payer: 59 | Source: Ambulatory Visit | Attending: Obstetrics and Gynecology | Admitting: Obstetrics and Gynecology

## 2021-11-19 ENCOUNTER — Other Ambulatory Visit: Payer: Self-pay

## 2021-11-19 DIAGNOSIS — Z1231 Encounter for screening mammogram for malignant neoplasm of breast: Secondary | ICD-10-CM

## 2021-12-04 ENCOUNTER — Other Ambulatory Visit: Payer: Self-pay

## 2021-12-04 ENCOUNTER — Encounter (INDEPENDENT_AMBULATORY_CARE_PROVIDER_SITE_OTHER): Payer: Self-pay | Admitting: Family Medicine

## 2021-12-04 ENCOUNTER — Ambulatory Visit (INDEPENDENT_AMBULATORY_CARE_PROVIDER_SITE_OTHER): Payer: 59 | Admitting: Family Medicine

## 2021-12-04 VITALS — BP 126/79 | HR 107 | Temp 99.4°F | Ht 64.0 in | Wt 255.0 lb

## 2021-12-04 DIAGNOSIS — R7303 Prediabetes: Secondary | ICD-10-CM | POA: Diagnosis not present

## 2021-12-04 DIAGNOSIS — E669 Obesity, unspecified: Secondary | ICD-10-CM

## 2021-12-04 DIAGNOSIS — Z6841 Body Mass Index (BMI) 40.0 and over, adult: Secondary | ICD-10-CM

## 2021-12-04 DIAGNOSIS — Z9189 Other specified personal risk factors, not elsewhere classified: Secondary | ICD-10-CM

## 2021-12-04 DIAGNOSIS — F3289 Other specified depressive episodes: Secondary | ICD-10-CM | POA: Diagnosis not present

## 2021-12-04 MED ORDER — BUPROPION HCL ER (SR) 150 MG PO TB12: 150.0000 mg | ORAL_TABLET | Freq: Every morning | ORAL | 0 refills | Status: DC

## 2021-12-04 MED ORDER — METFORMIN HCL 500 MG PO TABS: ORAL_TABLET | ORAL | 0 refills | Status: DC

## 2021-12-05 NOTE — Progress Notes (Signed)
Chief Complaint:   OBESITY Sarah Fields is here to discuss her progress with her obesity treatment plan along with follow-up of her obesity related diagnoses. Sarah Fields is on the Category 3 Plan and states she is following her eating plan approximately 80% of the time. Sarah Fields states she is doing 0 minutes 0 times per week.  Today's visit was #: 21 Starting weight: 295 lbs Starting date: 12/13/2020 Today's weight: 255 lbs Today's date: 12/04/2021 Total lbs lost to date: 40 Total lbs lost since last in-office visit: 0  Interim History: Sarah Fields has done well with maintaining her weight even over Christmas. She is ready to get back on track and she is open to looking at other eating plan options.  Subjective:   1. Pre-diabetes Sarah Fields is stable on metformin. She is working on decreasing simple carbohydrates in her diet. No side effects were noted.  2. Other depression with emotional eating Sarah Fields is stable on her medications. She is working on decreasing emotional eating behaviors. No problems noted.  3. At risk for dehydration Sarah Fields is at risk for dehydration due to decreasing simple carbohydrates.  Assessment/Plan:   1. Pre-diabetes We will refill metformin for 1 month. Sarah Fields will continue to work on weight loss, exercise, and decreasing simple carbohydrates to help decrease the risk of diabetes.   - metFORMIN (GLUCOPHAGE) 500 MG tablet; TAKE 1 TABLET BY MOUTH EVERY DAY WITH BREAKFAST  Dispense: 30 tablet; Refill: 0  2. Other depression with emotional eating Behavior modification techniques were discussed today to help Sarah Fields deal with her emotional/non-hunger eating behaviors. We will refill Wellbutrin SR for 1 month. Orders and follow up as documented in patient record.   - buPROPion (WELLBUTRIN SR) 150 MG 12 hr tablet; Take 1 tablet (150 mg total) by mouth in the morning.  Dispense: 30 tablet; Refill: 0  3. At risk for dehydration Sarah Fields was given  approximately 15 minutes dehydration prevention counseling today. Sarah Fields is at risk for dehydration due to weight loss and current medication(s). She was encouraged to hydrate and monitor fluid status to avoid dehydration as well as weight loss plateaus.   4. Obesity with current BMI of 43.8 Sarah Fields is currently in the action stage of change. As such, her goal is to continue with weight loss efforts. She has agreed to change to following a lower carbohydrate, vegetable and lean protein rich diet plan.   Behavioral modification strategies: increasing lean protein intake.  Sarah Fields has agreed to follow-up with our clinic in 3 weeks. She was informed of the importance of frequent follow-up visits to maximize her success with intensive lifestyle modifications for her multiple health conditions.   Objective:   Blood pressure 126/79, pulse (!) 107, temperature 99.4 F (37.4 C), height 5\' 4"  (1.626 m), weight 255 lb (115.7 kg), last menstrual period 11/07/2021, SpO2 98 %. Body mass index is 43.77 kg/m.  General: Cooperative, alert, well developed, in no acute distress. HEENT: Conjunctivae and lids unremarkable. Cardiovascular: Regular rhythm.  Lungs: Normal work of breathing. Neurologic: No focal deficits.   Lab Results  Component Value Date   CREATININE 1.01 (H) 09/07/2021   BUN 14 09/07/2021   NA 138 09/07/2021   K 4.1 09/07/2021   CL 103 09/07/2021   CO2 26 09/07/2021   Lab Results  Component Value Date   ALT 8 07/31/2021   AST 11 07/31/2021   ALKPHOS 104 07/31/2021   BILITOT 0.2 07/31/2021   Lab Results  Component Value Date   HGBA1C 5.5  07/31/2021   HGBA1C 5.4 12/13/2020   HGBA1C 5.5 05/30/2015   Lab Results  Component Value Date   INSULIN 19.6 07/31/2021   INSULIN 21.4 12/13/2020   Lab Results  Component Value Date   TSH 4.200 12/13/2020   Lab Results  Component Value Date   CHOL 143 07/31/2021   HDL 37 (L) 07/31/2021   LDLCALC 80 07/31/2021   TRIG 149  07/31/2021   CHOLHDL 4.5 05/30/2015   Lab Results  Component Value Date   VD25OH 112.0 (H) 07/31/2021   VD25OH 28.4 (L) 12/13/2020   Lab Results  Component Value Date   WBC 9.9 09/07/2021   HGB 14.2 09/07/2021   HCT 42.8 09/07/2021   MCV 93.4 09/07/2021   PLT 276 09/07/2021   No results found for: IRON, TIBC, FERRITIN  Attestation Statements:   Reviewed by clinician on day of visit: allergies, medications, problem list, medical history, surgical history, family history, social history, and previous encounter notes.   I, Burt Knack, am acting as transcriptionist for Quillian Quince, MD.  I have reviewed the above documentation for accuracy and completeness, and I agree with the above. -  Quillian Quince, MD

## 2021-12-16 ENCOUNTER — Other Ambulatory Visit (INDEPENDENT_AMBULATORY_CARE_PROVIDER_SITE_OTHER): Payer: Self-pay | Admitting: Family Medicine

## 2021-12-16 DIAGNOSIS — R7303 Prediabetes: Secondary | ICD-10-CM

## 2021-12-24 NOTE — Progress Notes (Signed)
[facility]    Provider:  Dr Jaynee Eagles Primary Care Provider:  Ronnell Freshwater, NP  CC:  tremor  Patient is doing well, no problems. will no charge, appt in error as Patient can have primary care refill primidone. NO CHARGE.   HPI:  Sarah Fields is a 46 y.o. female here as requested by Donald Prose, MD for tremors.  I reviewed Dr. Lynnda Child notes, patient has a past medical history of migraine, anxiety, insomnia, essential tremor, asthma, morbid obesity, unspecified leukocytosis, gestational diabetes, vitamin D deficiency.  Appears that she was tried on propranolol but worsened her asthma, propranolol was for tremors.  She was recently admitted in February for respiratory symptoms admitted for asthma exacerbation.  She was advised to stop the propranolol upon discharge from the hospital, with this change her tremors have become significantly worse, she also has a history of anxiety but is not on any treatment for it, she is not able to differentiate if the shaking is from her tremors or her mood with feeling nervous.  Of note, when she was seen in January 2021 she had not had a migraine in over 4 months but was having some headaches, experiencing light sensitivity and nausea with the migraines occasionally vomiting, Maxalt works acutely, he can trigger the migraines, her allergies are triggered by dust pollen mold and grass.  She apparently did see a neurologist in the past to evaluate her essential tremor and she was put on propranolol.  Tremor started in 2018. Still mostly the right arm occasionally to the left, action when she is writing and postural. She catches herself making fists because she can't see the tremor as much. She can feel the shaking inside. Propranolol helped but had asthma attack, she tried it again and had asthma symptoms. She is here alone. She has essential tremor, was treated with propranolol but can no longer tolerate due to asthma.Since she has  been off of the medication it is worsening, problems writing and typing. Dad had tremors too, he also has epilepsy. No weakness, no numbness or paresthesias. She has not noticed it in her voice or head but son noticed it in the past . Progressive and worsening. No significant caffeine. TSH checked in the past. She has migraines, they have been better lately, maxalt helps but she has to go to sleep, triggers are allergies and dust and she is seeing an asthma doctor/allergist for that now started allergy shots 2 weeks ago and feeling better. She snores, she did a sleep study and was negative, she thinks it is the allergies as well because since switching allergy meds the snoring is reduced. No other focal neurologic deficits, associated symptoms, inciting events or modifiable factors.    Reviewed notes, labs and imaging from outside physicians, which showed:  THS normal 01/19/2020, bmp bun/cr 14/0.95  Review of Systems: Patient complains of symptoms per HPI as well as the following symptoms: tremor. Pertinent negatives and positives per HPI. All others negative.   Social History   Socioeconomic History   Marital status: Divorced    Spouse name: Not on file   Number of children: 2   Years of education: Not on file   Highest education level: Master's degree (e.g., MA, MS, MEng, MEd, MSW, MBA)  Occupational History   Occupation: infant toddler Artist [employer]  Tobacco Use   Smoking status: Never   Smokeless tobacco: Never  Vaping Use   Vaping Use: Never used  Substance and Sexual  Activity   Alcohol use: Never    Alcohol/week: 0.0 standard drinks   Drug use: Never   Sexual activity: Not Currently    Partners: Male    Birth control/protection: Surgical    Comment: Tubal  Other Topics Concern   Not on file  Social History Narrative   Lives at home with her children   Right handed   Caffeine: 1 glass of tea per day?   Social Determinants of Health   Financial  Resource Strain: Not on file  Food Insecurity: Not on file  Transportation Needs: Not on file  Physical Activity: Not on file  Stress: Not on file  Social Connections: Not on file  Intimate Partner Violence: Not on file    Family History  Problem Relation Age of Onset   Hyperlipidemia Mother    Migraines Mother    Thyroid disease Mother    Glaucoma Mother    Osteoporosis Mother    Anxiety disorder Mother    Seizures Father    Hypertension Father    Cancer Father 24       Prostate Ca   Glaucoma Father    Stroke Maternal Grandmother    Cancer Maternal Grandfather    Migraines Maternal Grandfather    Stroke Paternal Grandfather    Tremor Neg Hx     Past Medical History:  Diagnosis Date   Allergy    Anxiety    Asthma    Broken foot    Depression    Essential tremor    Gestational diabetes    h/o   History of COVID-19 04/24/2021   Joint pain    Migraines    with aura   Other fatigue    Shortness of breath    Shortness of breath on exertion    Tenosynovitis, de Quervain    Vitamin D deficiency     Patient Active Problem List   Diagnosis Date Noted   Encounter for general adult medical examination with abnormal findings 09/01/2021   Low HDL (under 40) 09/01/2021   Body mass index (BMI) of 40.1-44.9 in adult ([place]) 09/01/2021   Screening for colon cancer 09/01/2021   Need for Tdap vaccination 09/01/2021   Need for influenza vaccination 09/01/2021   Encounter to establish care 08/04/2021   Intractable migraine with aura without status migrainosus 08/04/2021   Insulin resistance 03/21/2021   Vitamin D deficiency 03/21/2021   At risk for malnutrition 03/21/2021   Essential tremor 03/13/2020   Asthma attack 01/19/2020   Asthma 01/19/2020   Acute respiratory failure with hypoxia ([place])    Hypertriglyceridemia 05/31/2015    Past Surgical History:  Procedure Laterality Date   CESAREAN SECTION     2001, 2003   KNEE SURGERY Bilateral    arthroscopic   TUBAL  LIGATION     WISDOM TOOTH EXTRACTION      Current Outpatient Medications  Medication Sig Dispense Refill   albuterol (PROVENTIL HFA;VENTOLIN HFA) 108 (90 BASE) MCG/ACT inhaler Inhale 2 puffs into the lungs every 6 (six) hours as needed for wheezing or shortness of breath.      Azelastine-Fluticasone 137-50 MCG/ACT SUSP Place 1 spray into both nostrils 2 (two) times daily.     buPROPion (WELLBUTRIN SR) 150 MG 12 hr tablet Take 1 tablet (150 mg total) by mouth in the morning. 30 tablet 0   EPINEPHrine 0.3 mg/0.3 mL IJ SOAJ injection INJECT IN OUTER THIGH AS NEEDED FOR ANAPHYLAXIS     fluticasone (FLONASE) 50 MCG/ACT nasal spray  Place 2 sprays into both nostrils daily.     levocetirizine (XYZAL) 5 MG tablet SMARTSIG:1 Tablet(s) By Mouth Every Evening     Magnesium 400 MG CAPS Take 1 tablet by mouth daily.     metFORMIN (GLUCOPHAGE) 500 MG tablet TAKE 1 TABLET BY MOUTH EVERY DAY WITH BREAKFAST 30 tablet 0   montelukast (SINGULAIR) 10 MG tablet Take 10 mg by mouth at bedtime.      norethindrone (INCASSIA) 0.35 MG tablet Take 1 tablet (0.35 mg total) by mouth daily. 84 tablet 3   polyethylene glycol (MIRALAX / GLYCOLAX) 17 g packet Take 17 g by mouth daily. With increased water intake. 30 each 0   rizatriptan (MAXALT) 10 MG tablet Take 1 tablet (10 mg total) by mouth as needed for migraine. May repeat in 2 hours if needed 10 tablet 5   sodium chloride (OCEAN) 0.65 % SOLN nasal spray Place 1 spray into both nostrils as needed for congestion.  0   SYMBICORT 160-4.5 MCG/ACT inhaler      Vitamin D, Ergocalciferol, (DRISDOL) 1.25 MG (50000 UNIT) CAPS capsule TAKE 1 CAPSULE BY MOUTH ONCE EVERY 3 DAYS. 10 capsule 0   primidone (MYSOLINE) 50 MG tablet TAKE 1 TABLET BY MOUTH EVERYDAY AT BEDTIME 90 tablet 4   No current facility-administered medications for this visit.    Allergies as of 12/25/2021 - Review Complete 12/25/2021  Allergen Reaction Noted   Banana  12/13/2020   Hydrocodone Other (See  Comments) 10/12/2020   Latex  02/04/2014   Other  02/04/2014   Penicillins Swelling 02/04/2014   Pineapple  12/13/2020   Propranolol  03/13/2020   Strawberry extract  12/13/2020   Adhesive [tape] Rash 10/03/2015   Bactrim [sulfamethoxazole-trimethoprim] Rash 06/13/2016    Vitals: BP (!) 136/95    Pulse 92    Ht 5\' 4"  (1.626 m)    Wt 259 lb 8 oz (117.7 kg)    BMI 44.54 kg/m  Last Weight:  Wt Readings from Last 1 Encounters:  12/25/21 259 lb 8 oz (117.7 kg)   Last Height:   Ht Readings from Last 1 Encounters:  12/25/21 5\' 4"  (1.626 m)     No postural or action tremor today(improved with primidone)  Assessment/Plan:  [age] year old with improved essential tremor.  Was doing extremely well on propranolol but started affecting her allergies and asthma.  Last appointment we had a long talk about essential tremor, progression including to the other arm/head/voice as well as feeling tremors internally, no signs of parkinsonism in this patient.  We had a long talk about possibilities last time including primidone, Neurontin and Topamax (Topamax may be best for her migraines but she feels her migraines are improving with allergy medications).  Doing great on Primidone  If she develops spotting then we may have to switch as primidone may decrease effectiveness of birth control, watch for spotting and breakthrough bleeding.  Also watch for sedation if 50 mg makes her too sedated she can cut the pill in half at night.  We can also slowly increase up to 250 mg.  Primidone, 25-mg works great. Patient is doing well, no problems. will no charge, appt in error as Patient can have primary care refill primidone. NO CHARGE.    Meds ordered this encounter  Medications   primidone (MYSOLINE) 50 MG tablet    Sig: TAKE 1 TABLET BY MOUTH EVERYDAY AT BEDTIME    Dispense:  90 tablet    Refill:  4    Cc:  Donald Prose, MD,    Sarina Ill, MD  [facility] [address] [place], [place] [address]  Phone [phone] Fax [phone]

## 2021-12-25 ENCOUNTER — Encounter: Payer: Self-pay | Admitting: Neurology

## 2021-12-25 ENCOUNTER — Other Ambulatory Visit: Payer: Self-pay

## 2021-12-25 ENCOUNTER — Ambulatory Visit (INDEPENDENT_AMBULATORY_CARE_PROVIDER_SITE_OTHER): Payer: Self-pay | Admitting: Neurology

## 2021-12-25 VITALS — BP 136/95 | HR 92 | Ht 64.0 in | Wt 259.5 lb

## 2021-12-25 DIAGNOSIS — G250 Essential tremor: Secondary | ICD-10-CM

## 2021-12-25 MED ORDER — PRIMIDONE 50 MG PO TABS: ORAL_TABLET | ORAL | 4 refills | Status: DC

## 2021-12-26 ENCOUNTER — Ambulatory Visit (INDEPENDENT_AMBULATORY_CARE_PROVIDER_SITE_OTHER): Payer: 59 | Admitting: Family Medicine

## 2021-12-26 ENCOUNTER — Other Ambulatory Visit: Payer: Self-pay

## 2021-12-26 ENCOUNTER — Encounter (INDEPENDENT_AMBULATORY_CARE_PROVIDER_SITE_OTHER): Payer: Self-pay | Admitting: Family Medicine

## 2021-12-26 VITALS — BP 138/79 | HR 87 | Temp 99.2°F | Ht 64.0 in | Wt 253.0 lb

## 2021-12-26 DIAGNOSIS — R7303 Prediabetes: Secondary | ICD-10-CM | POA: Diagnosis not present

## 2021-12-26 DIAGNOSIS — E669 Obesity, unspecified: Secondary | ICD-10-CM

## 2021-12-26 DIAGNOSIS — F3289 Other specified depressive episodes: Secondary | ICD-10-CM | POA: Diagnosis not present

## 2021-12-26 DIAGNOSIS — Z9189 Other specified personal risk factors, not elsewhere classified: Secondary | ICD-10-CM

## 2021-12-26 DIAGNOSIS — Z6841 Body Mass Index (BMI) 40.0 and over, adult: Secondary | ICD-10-CM | POA: Diagnosis not present

## 2021-12-26 MED ORDER — METFORMIN HCL 500 MG PO TABS: ORAL_TABLET | ORAL | 0 refills | Status: DC

## 2021-12-26 MED ORDER — BUPROPION HCL ER (SR) 150 MG PO TB12: 150.0000 mg | ORAL_TABLET | Freq: Every morning | ORAL | 0 refills | Status: DC

## 2021-12-26 NOTE — Progress Notes (Signed)
Chief Complaint:   OBESITY Sarah Fields is here to discuss her progress with her obesity treatment plan along with follow-up of her obesity related diagnoses. Sarah Fields is on following a lower carbohydrate, vegetable and lean protein rich diet plan and states she is following her eating plan approximately 50% of the time. Sarah Fields states she is doing chair exercise for 12 minutes 2 times per week.  Today's visit was #: 22 Starting weight: 295 lbs Starting date: 12/13/2020 Today's weight: 253 lbs Today's date: 12/26/2021 Total lbs lost to date: 42 Total lbs lost since last in-office visit: 2  Interim History: Sarah Fields struggled to follow the Low carbohydrate plan. She struggled with meal planning. She wants to try the Low carbohydrate plan again, but she has questions about planning and eating out.  Subjective:   1. Pre-diabetes Sarah Fields's last A1c was 5.5. She is doing well with her diet and exercise, with no side effects noted.  2. Other depression with emotional eating Sarah Fields is stable on her medications. No side effects were noted. Her work stress has been higher. She had a tearful day yesterday, but she is getting good support from her friends.   3. At risk for heart disease Sarah Fields is at higher than average risk for cardiovascular disease due to obesity.  Assessment/Plan:   1. Pre-diabetes Sarah Fields will continue to work on weight loss, exercise, and decreasing simple carbohydrates to help decrease the risk of diabetes. We will refill metformin 500 mg q AM #30 for 1 month.  2. Other depression with emotional eating Behavior modification techniques were discussed today to help Sarah Fields deal with her emotional/non-hunger eating behaviors. We will refill Wellbutrin SR 150 mg q AM #30 for 1 month. Orders and follow up as documented in patient record.   3. At risk for heart disease Sarah Fields was given approximately 15 minutes of coronary artery disease prevention counseling today.  She is 46 y.o. female and has risk factors for heart disease including obesity. We discussed intensive lifestyle modifications today with an emphasis on specific weight loss instructions and strategies.  Repetitive spaced learning was employed today to elicit superior memory formation and behavioral change.   4. Obesity with current BMI of 43.6 Sarah Fields is currently in the action stage of change. As such, her goal is to continue with weight loss efforts. She has agreed to following a lower carbohydrate, vegetable and lean protein rich diet plan.   Exercise goals: As is.  Behavioral modification strategies: decreasing eating out, meal planning and cooking strategies, and emotional eating strategies.  Sarah Fields has agreed to follow-up with our clinic in 3 to 4 weeks. She was informed of the importance of frequent follow-up visits to maximize her success with intensive lifestyle modifications for her multiple health conditions.   Objective:   Blood pressure 138/79, pulse 87, temperature 99.2 F (37.3 C), height 5\' 4"  (1.626 m), weight 253 lb (114.8 kg), SpO2 98 %. Body mass index is 43.43 kg/m.  General: Cooperative, alert, well developed, in no acute distress. HEENT: Conjunctivae and lids unremarkable. Cardiovascular: Regular rhythm.  Lungs: Normal work of breathing. Neurologic: No focal deficits.   Lab Results  Component Value Date   CREATININE 1.01 (H) 09/07/2021   BUN 14 09/07/2021   NA 138 09/07/2021   K 4.1 09/07/2021   CL 103 09/07/2021   CO2 26 09/07/2021   Lab Results  Component Value Date   ALT 8 07/31/2021   AST 11 07/31/2021   ALKPHOS 104 07/31/2021  BILITOT 0.2 07/31/2021   Lab Results  Component Value Date   HGBA1C 5.5 07/31/2021   HGBA1C 5.4 12/13/2020   HGBA1C 5.5 05/30/2015   Lab Results  Component Value Date   INSULIN 19.6 07/31/2021   INSULIN 21.4 12/13/2020   Lab Results  Component Value Date   TSH 4.200 12/13/2020   Lab Results  Component  Value Date   CHOL 143 07/31/2021   HDL 37 (L) 07/31/2021   LDLCALC 80 07/31/2021   TRIG 149 07/31/2021   CHOLHDL 4.5 05/30/2015   Lab Results  Component Value Date   VD25OH 112.0 (H) 07/31/2021   VD25OH 28.4 (L) 12/13/2020   Lab Results  Component Value Date   WBC 9.9 09/07/2021   HGB 14.2 09/07/2021   HCT 42.8 09/07/2021   MCV 93.4 09/07/2021   PLT 276 09/07/2021   No results found for: IRON, TIBC, FERRITIN  Attestation Statements:   Reviewed by clinician on day of visit: allergies, medications, problem list, medical history, surgical history, family history, social history, and previous encounter notes.   I, Burt Knack, am acting as transcriptionist for Quillian Quince, MD.  I have reviewed the above documentation for accuracy and completeness, and I agree with the above. -  Quillian Quince, MD

## 2022-01-18 ENCOUNTER — Other Ambulatory Visit (INDEPENDENT_AMBULATORY_CARE_PROVIDER_SITE_OTHER): Payer: Self-pay | Admitting: Family Medicine

## 2022-01-18 DIAGNOSIS — R7303 Prediabetes: Secondary | ICD-10-CM

## 2022-01-20 NOTE — Telephone Encounter (Signed)
Dr.Beasley 

## 2022-01-21 ENCOUNTER — Ambulatory Visit (INDEPENDENT_AMBULATORY_CARE_PROVIDER_SITE_OTHER): Payer: 59 | Admitting: Family Medicine

## 2022-01-21 ENCOUNTER — Other Ambulatory Visit: Payer: Self-pay

## 2022-01-21 ENCOUNTER — Encounter (INDEPENDENT_AMBULATORY_CARE_PROVIDER_SITE_OTHER): Payer: Self-pay | Admitting: Family Medicine

## 2022-01-21 VITALS — BP 123/83 | HR 89 | Temp 98.7°F | Ht 64.0 in

## 2022-01-21 DIAGNOSIS — R7303 Prediabetes: Secondary | ICD-10-CM

## 2022-01-21 DIAGNOSIS — F3289 Other specified depressive episodes: Secondary | ICD-10-CM | POA: Diagnosis not present

## 2022-01-21 DIAGNOSIS — E669 Obesity, unspecified: Secondary | ICD-10-CM

## 2022-01-21 DIAGNOSIS — Z6841 Body Mass Index (BMI) 40.0 and over, adult: Secondary | ICD-10-CM

## 2022-01-22 NOTE — Progress Notes (Signed)
Chief Complaint:   OBESITY Merlinda is here to discuss her progress with her obesity treatment plan along with follow-up of her obesity related diagnoses. Donah is on following a lower carbohydrate, vegetable and lean protein rich diet plan and states she is following her eating plan approximately 70% of the time. Ariahnna states she is doing some walking.   Today's visit was #: 23 Starting weight: 295 lbs Starting date: 12/13/2020 Today's weight: 254 lbs Today's date: 01/21/2022 Total lbs lost to date: 41 Total lbs lost since last in-office visit: 0  Interim History: Ryan reports over the past 3 weeks, she followed the Low carbohydrate plan for the first week, and did well. She went out of town for work and got off track due to traveling for work. She likes to follow the Low carbohydrates plan, and she is asking for help with meal planning while traveling.  Subjective:   1. Pre-diabetes Marchetta's last A1c was 5.5 on 07/31/2021. She is taking metformin 500 mg daily, and she denies side effects.  2. Other depression with emotional eating Tyneisha is taking Wellbutrin SR 150 mg, and she denies side effects. She notes some changes in her dreams recently with some increased stress. She has been on Wellbutrin for a while without side effects.   Assessment/Plan:   1. Pre-diabetes Jorie will continue working on dietary changes, exercise, and weight loss. She will continue her medications as directed.  2. Other depression with emotional eating Shambria will continue Wellbutrin SR 150 mg, and will continue to monitor for changes in dreams. If this worsens or persist, she is to let us know.  3. Obesity with current BMI of 43.7 Fatima is currently in the action stage of change. As such, her goal is to continue with weight loss efforts. She has agreed to following a lower carbohydrate, vegetable and lean protein rich diet plan.   We discussed food options/choices with  traveling.  Behavioral modification strategies: increasing lean protein intake, increasing water intake, no skipping meals, and travel eating strategies.  Delanee has agreed to follow-up with our clinic in 3 weeks. She was informed of the importance of frequent follow-up visits to maximize her success with intensive lifestyle modifications for her multiple health conditions.   Objective:   Blood pressure 123/83, pulse 89, temperature 98.7 F (37.1 C), height 5\' 4"  (1.626 m), SpO2 97 %. Body mass index is 43.43 kg/m.  General: Cooperative, alert, well developed, in no acute distress. HEENT: Conjunctivae and lids unremarkable. Cardiovascular: Regular rhythm.  Lungs: Normal work of breathing. Neurologic: No focal deficits.   Lab Results  Component Value Date   CREATININE 1.01 (H) 09/07/2021   BUN 14 09/07/2021   NA 138 09/07/2021   K 4.1 09/07/2021   CL 103 09/07/2021   CO2 26 09/07/2021   Lab Results  Component Value Date   ALT 8 07/31/2021   AST 11 07/31/2021   ALKPHOS 104 07/31/2021   BILITOT 0.2 07/31/2021   Lab Results  Component Value Date   HGBA1C 5.5 07/31/2021   HGBA1C 5.4 12/13/2020   HGBA1C 5.5 05/30/2015   Lab Results  Component Value Date   INSULIN 19.6 07/31/2021   INSULIN 21.4 12/13/2020   Lab Results  Component Value Date   TSH 4.200 12/13/2020   Lab Results  Component Value Date   CHOL 143 07/31/2021   HDL 37 (L) 07/31/2021   LDLCALC 80 07/31/2021   TRIG 149 07/31/2021   CHOLHDL 4.5 05/30/2015  Lab Results  Component Value Date   VD25OH 112.0 (H) 07/31/2021   VD25OH 28.4 (L) 12/13/2020   Lab Results  Component Value Date   WBC 9.9 09/07/2021   HGB 14.2 09/07/2021   HCT 42.8 09/07/2021   MCV 93.4 09/07/2021   PLT 276 09/07/2021   No results found for: IRON, TIBC, FERRITIN  Attestation Statements:   Reviewed by clinician on day of visit: allergies, medications, problem list, medical history, surgical history, family history,  social history, and previous encounter notes.  Time spent on visit including pre-visit chart review and post-visit care and charting was 30 minutes.    I, Trixie Dredge, am acting as transcriptionist for Dennard Nip, MD.  I have reviewed the above documentation for accuracy and completeness, and I agree with the above. -  Dennard Nip, MD

## 2022-01-29 ENCOUNTER — Other Ambulatory Visit (INDEPENDENT_AMBULATORY_CARE_PROVIDER_SITE_OTHER): Payer: Self-pay | Admitting: Family Medicine

## 2022-01-29 DIAGNOSIS — R7303 Prediabetes: Secondary | ICD-10-CM

## 2022-02-11 ENCOUNTER — Other Ambulatory Visit: Payer: Self-pay

## 2022-02-11 ENCOUNTER — Ambulatory Visit (INDEPENDENT_AMBULATORY_CARE_PROVIDER_SITE_OTHER): Payer: 59 | Admitting: Family Medicine

## 2022-02-11 ENCOUNTER — Encounter (INDEPENDENT_AMBULATORY_CARE_PROVIDER_SITE_OTHER): Payer: Self-pay | Admitting: Family Medicine

## 2022-02-11 VITALS — BP 116/83 | HR 98 | Temp 98.7°F | Ht 64.0 in | Wt 250.0 lb

## 2022-02-11 DIAGNOSIS — Z6841 Body Mass Index (BMI) 40.0 and over, adult: Secondary | ICD-10-CM

## 2022-02-11 DIAGNOSIS — R7303 Prediabetes: Secondary | ICD-10-CM | POA: Diagnosis not present

## 2022-02-11 DIAGNOSIS — Z9189 Other specified personal risk factors, not elsewhere classified: Secondary | ICD-10-CM

## 2022-02-11 DIAGNOSIS — E669 Obesity, unspecified: Secondary | ICD-10-CM

## 2022-02-11 DIAGNOSIS — F3289 Other specified depressive episodes: Secondary | ICD-10-CM | POA: Diagnosis not present

## 2022-02-11 DIAGNOSIS — E559 Vitamin D deficiency, unspecified: Secondary | ICD-10-CM

## 2022-02-11 MED ORDER — METFORMIN HCL 500 MG PO TABS: ORAL_TABLET | ORAL | 0 refills | Status: DC

## 2022-02-11 MED ORDER — BUPROPION HCL ER (SR) 150 MG PO TB12: 150.0000 mg | ORAL_TABLET | Freq: Every morning | ORAL | 0 refills | Status: DC

## 2022-02-11 MED ORDER — BUPROPION HCL ER (SR) 200 MG PO TB12: 200.0000 mg | ORAL_TABLET | Freq: Every day | ORAL | 0 refills | Status: DC

## 2022-02-12 LAB — CMP14+EGFR
ALT: 11 IU/L (ref 0–32)
AST: 17 IU/L (ref 0–40)
Albumin/Globulin Ratio: 1.5 (ref 1.2–2.2)
Albumin: 4.2 g/dL (ref 3.8–4.8)
Alkaline Phosphatase: 93 IU/L (ref 44–121)
BUN/Creatinine Ratio: 21 (ref 9–23)
BUN: 20 mg/dL (ref 6–24)
Bilirubin Total: 0.4 mg/dL (ref 0.0–1.2)
CO2: 23 mmol/L (ref 20–29)
Calcium: 9.5 mg/dL (ref 8.7–10.2)
Chloride: 101 mmol/L (ref 96–106)
Creatinine, Ser: 0.95 mg/dL (ref 0.57–1.00)
Globulin, Total: 2.8 g/dL (ref 1.5–4.5)
Glucose: 85 mg/dL (ref 70–99)
Potassium: 5.1 mmol/L (ref 3.5–5.2)
Sodium: 138 mmol/L (ref 134–144)
Total Protein: 7 g/dL (ref 6.0–8.5)
eGFR: 74 mL/min/{1.73_m2} (ref >59)

## 2022-02-12 LAB — LIPID PANEL WITH LDL/HDL RATIO
Cholesterol, Total: 189 mg/dL (ref 100–199)
HDL: 41 mg/dL (ref >39)
LDL Chol Calc (NIH): 117 mg/dL — ABNORMAL HIGH (ref 0–99)
LDL/HDL Ratio: 2.9 ratio (ref 0.0–3.2)
Triglycerides: 173 mg/dL — ABNORMAL HIGH (ref 0–149)
VLDL Cholesterol Cal: 31 mg/dL (ref 5–40)

## 2022-02-12 LAB — HEMOGLOBIN A1C
Est. average glucose Bld gHb Est-mCnc: 108 mg/dL
Hgb A1c MFr Bld: 5.4 % (ref 4.8–5.6)

## 2022-02-12 LAB — VITAMIN B12: Vitamin B-12: 383 pg/mL (ref 232–1245)

## 2022-02-12 LAB — INSULIN, RANDOM: INSULIN: 17.3 u[IU]/mL (ref 2.6–24.9)

## 2022-02-12 LAB — VITAMIN D 25 HYDROXY (VIT D DEFICIENCY, FRACTURES): Vit D, 25-Hydroxy: 64.7 ng/mL (ref 30.0–100.0)

## 2022-02-14 NOTE — Progress Notes (Signed)
? ? ? ?Chief Complaint:  ? ?OBESITY ?Sarah Fields is here to discuss her progress with her obesity treatment plan along with follow-up of her obesity related diagnoses. Sarah Fields is on following a lower carbohydrate, vegetable and lean protein rich diet plan and states she is following her eating plan approximately 85% of the time. Sarah Fields states she is doing 0 minutes 0 times per week. ? ?Today's visit was #: 24 ?Starting weight: 295 lbs ?Starting date: 12/13/2020 ?Today's weight: 250 lbs ?Today's date: 02/11/2022 ?Total lbs lost to date: 32 ?Total lbs lost since last in-office visit: 4 ? ?Interim History: Sarah Fields continues to do well with weight loss on her low  carbohydrate plan. Her hunger is mostly controlled but she notes increased temptations and some increased cravings for sugar. ? ?Subjective:  ? ?1. Pre-diabetes ?Jaz is on metformin and she is doing well with diet and weight loss. She is due for labs. ? ?2. Vitamin D deficiency ?Sarah Fields is on Vitamin D, and she is due for labs. ? ?3. Other depression with emotional eating ?Sarah Fields notes increased cravings, worse in the PM. She is open to increasing her Wellbutrin dose to help. ? ?4. At risk for diabetes mellitus ?Sarah Fields is at higher than average risk for developing diabetes due to her obesity. ? ?Assessment/Plan:  ? ?1. Pre-diabetes ?We will check labs today. Sarah Fields will continue metformin, and we will refill for 1 month. ? ?- metFORMIN (GLUCOPHAGE) 500 MG tablet; TAKE 1 TABLET BY MOUTH EVERY DAY WITH BREAKFAST  Dispense: 30 tablet; Refill: 0 ?- CMP14+EGFR ?- Lipid Panel With LDL/HDL Ratio ?- Insulin, random ?- Hemoglobin A1c ?- Vitamin B12 ? ?2. Vitamin D deficiency ?We will check labs today. Sarah Fields will follow-up for routine testing of Vitamin D, at least 2-3 times per year to avoid over-replacement. ? ?- VITAMIN D 25 Hydroxy (Vit-D Deficiency, Fractures) ? ?3. Other depression with emotional eating ?Sarah Fields agreed to increase Wellbutrin SR to  200 mg q AM, and we will refill for 1 month. ? ?- buPROPion (WELLBUTRIN SR) 200 MG 12 hr tablet; Take 1 tablet (200 mg total) by mouth daily with breakfast.  Dispense: 30 tablet; Refill: 0 ? ?4. At risk for diabetes mellitus ?Sarah Fields was given approximately 15 minutes of diabetic education and counseling today. We discussed intensive lifestyle modifications today with an emphasis on weight loss as well as increasing exercise and decreasing simple carbohydrates in her diet. We also reviewed medication options with an emphasis on risk versus benefits of those discussed. ? ?Repetitive spaced learning was employed today to elicit superior memory formation and behavioral change. ? ?5. Obesity with current BMI of 43.0 ?Sarah Fields is currently in the action stage of change. As such, her goal is to continue with weight loss efforts. She has agreed to following a lower carbohydrate, vegetable and lean protein rich diet plan.  ? ?Behavioral modification strategies: increasing lean protein intake, meal planning and cooking strategies, dealing with family or coworker sabotage, and avoiding temptations. ? ?Sarah Fields has agreed to follow-up with our clinic in 3 weeks. She was informed of the importance of frequent follow-up visits to maximize her success with intensive lifestyle modifications for her multiple health conditions.  ? ?Sarah Fields was informed we would discuss her lab results at her next visit unless there is a critical issue that needs to be addressed sooner. Sarah Fields agreed to keep her next visit at the agreed upon time to discuss these results. ? ?Objective:  ? ?Blood pressure 116/83, pulse 98, temperature 98.7 ?F (  37.1 ?C), height _0  (1.626 m), weight 250 lb (113.4 kg), SpO2 97 %. ?Body mass index is 42.91 kg/m?. ? ?General: Cooperative, alert, well developed, in no acute distress. ?HEENT: Conjunctivae and lids unremarkable. ?Cardiovascular: Regular rhythm.  ?Lungs: Normal work of breathing. ?Neurologic: No focal  deficits.  ? ?Lab Results  ?Component Value Date  ? CREATININE 0.95 02/11/2022  ? BUN 20 02/11/2022  ? NA 138 02/11/2022  ? K 5.1 02/11/2022  ? CL 101 02/11/2022  ? CO2 23 02/11/2022  ? ?Lab Results  ?Component Value Date  ? ALT 11 02/11/2022  ? AST 17 02/11/2022  ? ALKPHOS 93 02/11/2022  ? BILITOT 0.4 02/11/2022  ? ?Lab Results  ?Component Value Date  ? HGBA1C 5.4 02/11/2022  ? HGBA1C 5.5 07/31/2021  ? HGBA1C 5.4 12/13/2020  ? HGBA1C 5.5 05/30/2015  ? ?Lab Results  ?Component Value Date  ? INSULIN 17.3 02/11/2022  ? INSULIN 19.6 07/31/2021  ? INSULIN 21.4 12/13/2020  ? ?Lab Results  ?Component Value Date  ? TSH 4.200 12/13/2020  ? ?Lab Results  ?Component Value Date  ? CHOL 189 02/11/2022  ? HDL 41 02/11/2022  ? LDLCALC 117 (H) 02/11/2022  ? TRIG 173 (H) 02/11/2022  ? CHOLHDL 4.5 05/30/2015  ? ?Lab Results  ?Component Value Date  ? VD25OH 64.7 02/11/2022  ? VD25OH 112.0 (H) 07/31/2021  ? VD25OH 28.4 (L) 12/13/2020  ? ?Lab Results  ?Component Value Date  ? WBC 9.9 09/07/2021  ? HGB 14.2 09/07/2021  ? HCT 42.8 09/07/2021  ? MCV 93.4 09/07/2021  ? PLT 276 09/07/2021  ? ?No results found for: IRON, TIBC, FERRITIN ? ?Attestation Statements:  ? ?Reviewed by clinician on day of visit: allergies, medications, problem list, medical history, surgical history, family history, social history, and previous encounter notes. ? ? ?I, Trixie Dredge, am acting as transcriptionist for Dennard Nip, MD. ? ?I have reviewed the above documentation for accuracy and completeness, and I agree with the above. -  Dennard Nip, MD ? ? ?

## 2022-02-19 ENCOUNTER — Ambulatory Visit: Payer: 59 | Admitting: Nurse Practitioner

## 2022-02-19 ENCOUNTER — Other Ambulatory Visit: Payer: Self-pay

## 2022-02-19 ENCOUNTER — Encounter: Payer: Self-pay | Admitting: Nurse Practitioner

## 2022-02-19 VITALS — BP 112/79 | HR 85 | Temp 98.2°F | Ht 64.17 in | Wt 259.4 lb

## 2022-02-19 DIAGNOSIS — Z6841 Body Mass Index (BMI) 40.0 and over, adult: Secondary | ICD-10-CM

## 2022-02-19 DIAGNOSIS — J4520 Mild intermittent asthma, uncomplicated: Secondary | ICD-10-CM

## 2022-02-19 DIAGNOSIS — G43119 Migraine with aura, intractable, without status migrainosus: Secondary | ICD-10-CM | POA: Diagnosis not present

## 2022-02-19 DIAGNOSIS — G250 Essential tremor: Secondary | ICD-10-CM

## 2022-02-19 DIAGNOSIS — R7303 Prediabetes: Secondary | ICD-10-CM

## 2022-02-19 MED ORDER — RIZATRIPTAN BENZOATE 10 MG PO TABS: 10.0000 mg | ORAL_TABLET | ORAL | 5 refills | Status: DC | PRN

## 2022-02-19 NOTE — Progress Notes (Signed)
Established patient visit ? ? ?Patient: Sarah Fields   DOB: March 01, 1976   46 y.o. Female  MRN: [id number] ?Visit Date: 02/19/2022 ? ? ?Chief Complaint  ?Patient presents with  ? Follow-up  ? Migraine  ? ?Subjective  ?  ?HPI  ?The patient is here for routine follow up.  ?-she does have history of tremor. Has seen neurology. Is on primidone as she had allergic reaction to propranolol. She does take 1/2 tablet at bedtime. This has been stable for years. Neurologist has asked that I refill this until situation canges.  ?-migraines are starting again. Has had to take two Maxalt tablets the first day of last two menstrual cycles. Does resolve the headache.  ?-[facility] Recent weight gain noted.  ?-recent labs showed elevated LDL and triglyceride levels with other labs being normal.  ?-no new concerns or complaints today ? ? ?Medications: ?Outpatient Medications Prior to Visit  ?Medication Sig  ? albuterol (PROVENTIL HFA;VENTOLIN HFA) 108 (90 BASE) MCG/ACT inhaler Inhale 2 puffs into the lungs every 6 (six) hours as needed for wheezing or shortness of breath.   ? Azelastine-Fluticasone 137-50 MCG/ACT SUSP Place 1 spray into both nostrils 2 (two) times daily.  ? buPROPion (WELLBUTRIN SR) 200 MG 12 hr tablet Take 1 tablet (200 mg total) by mouth daily with breakfast.  ? EPINEPHrine 0.3 mg/0.3 mL IJ SOAJ injection INJECT IN OUTER THIGH AS NEEDED FOR ANAPHYLAXIS  ? fluticasone (FLONASE) 50 MCG/ACT nasal spray Place 2 sprays into both nostrils daily.  ? levocetirizine (XYZAL) 5 MG tablet SMARTSIG:1 Tablet(s) By Mouth Every Evening  ? Magnesium 400 MG CAPS Take 1 tablet by mouth daily.  ? metFORMIN (GLUCOPHAGE) 500 MG tablet TAKE 1 TABLET BY MOUTH EVERY DAY WITH BREAKFAST  ? montelukast (SINGULAIR) 10 MG tablet Take 10 mg by mouth at bedtime.   ? norethindrone (INCASSIA) 0.35 MG tablet Take 1 tablet (0.35 mg total) by mouth daily.  ? polyethylene glycol (MIRALAX / GLYCOLAX) 17  g packet Take 17 g by mouth daily. With increased water intake.  ? primidone (MYSOLINE) 50 MG tablet TAKE 1 TABLET BY MOUTH EVERYDAY AT BEDTIME  ? sodium chloride (OCEAN) 0.65 % SOLN nasal spray Place 1 spray into both nostrils as needed for congestion.  ? SYMBICORT 160-4.5 MCG/ACT inhaler   ? Vitamin D, Ergocalciferol, (DRISDOL) 1.25 MG (50000 UNIT) CAPS capsule TAKE 1 CAPSULE BY MOUTH ONCE EVERY 3 DAYS.  ? [DISCONTINUED] rizatriptan (MAXALT) 10 MG tablet Take 1 tablet (10 mg total) by mouth as needed for migraine. May repeat in 2 hours if needed  ? ?No facility-administered medications prior to visit.  ? ? ?Review of Systems  ?Constitutional:  Negative for activity change, appetite change, chills, fatigue and fever.  ?     Nine pound weight gain  ?HENT:  Negative for congestion, postnasal drip, rhinorrhea, sinus pressure, sinus pain, sneezing and sore throat.   ?Eyes: Negative.   ?Respiratory:  Negative for cough, chest tightness, shortness of breath and wheezing.   ?Cardiovascular:  Negative for chest pain and palpitations.  ?Gastrointestinal:  Negative for abdominal pain, constipation, diarrhea, nausea and vomiting.  ?Endocrine: Negative for cold intolerance, heat intolerance, polydipsia and polyuria.  ?Genitourinary:  Negative for dyspareunia, dysuria, flank pain, frequency and urgency.  ?Musculoskeletal:  Negative for arthralgias, back pain and myalgias.  ?Skin:  Negative for rash.  ?Allergic/Immunologic: Negative for environmental allergies.  ?Neurological:  Positive for tremors and headaches. Negative for dizziness and  weakness.  ?     Recent worsening of migraine headaches.   ?Hematological:  Negative for adenopathy.  ?Psychiatric/Behavioral:  The patient is not nervous/anxious.   ? ?Last CBC ?Lab Results  ?Component Value Date  ? WBC 9.9 09/07/2021  ? HGB 14.2 09/07/2021  ? HCT 42.8 09/07/2021  ? MCV 93.4 09/07/2021  ? MCH 31.0 09/07/2021  ? RDW 12.5 09/07/2021  ? PLT 276 09/07/2021  ? ?Last metabolic  panel ?Lab Results  ?Component Value Date  ? GLUCOSE 85 02/11/2022  ? NA 138 02/11/2022  ? K 5.1 02/11/2022  ? CL 101 02/11/2022  ? CO2 23 02/11/2022  ? BUN 20 02/11/2022  ? CREATININE 0.95 02/11/2022  ? EGFR 74 02/11/2022  ? CALCIUM 9.5 02/11/2022  ? PROT 7.0 02/11/2022  ? ALBUMIN 4.2 02/11/2022  ? LABGLOB 2.8 02/11/2022  ? AGRATIO 1.5 02/11/2022  ? BILITOT 0.4 02/11/2022  ? ALKPHOS 93 02/11/2022  ? AST 17 02/11/2022  ? ALT 11 02/11/2022  ? ANIONGAP 9 09/07/2021  ? ?Last lipids ?Lab Results  ?Component Value Date  ? CHOL 189 02/11/2022  ? HDL 41 02/11/2022  ? LDLCALC 117 (H) 02/11/2022  ? TRIG 173 (H) 02/11/2022  ? CHOLHDL 4.5 05/30/2015  ? ?Last hemoglobin A1c ?Lab Results  ?Component Value Date  ? HGBA1C 5.4 02/11/2022  ? ?Last thyroid functions ?Lab Results  ?Component Value Date  ? TSH 4.200 12/13/2020  ? T3TOTAL 126 12/13/2020  ? T4TOTAL 6.7 12/13/2020  ? ?Last vitamin D ?Lab Results  ?Component Value Date  ? VD25OH 64.7 02/11/2022  ? ?Last vitamin B12 and Folate ?Lab Results  ?Component Value Date  ? [id number] 02/11/2022  ? FOLATE 10.9 12/13/2020  ? ?  ? ? Objective  ?  ? ?Today's Vitals  ? 02/19/22 0925  ?BP: 112/79  ?Pulse: 85  ?Temp: 98.2 ?F (36.8 ?C)  ?SpO2: 97%  ?Weight: 259 lb 6.4 oz (117.7 kg)  ?Height: 5' 4.17" (1.63 m)  ? ?Body mass index is 44.29 kg/m?.  ? ?BP Readings from Last 3 Encounters:  ?02/19/22 112/79  ?02/11/22 116/83  ?01/21/22 123/83  ?  ?Wt Readings from Last 3 Encounters:  ?02/19/22 259 lb 6.4 oz (117.7 kg)  ?02/11/22 250 lb (113.4 kg)  ?12/26/21 253 lb (114.8 kg)  ?  ?Physical Exam ?Vitals and nursing note reviewed.  ?Constitutional:   ?   Appearance: Normal appearance. She is well-developed. She is obese.  ?HENT:  ?   Head: Normocephalic and atraumatic.  ?   Nose: Nose normal.  ?   Mouth/Throat:  ?   Mouth: Mucous membranes are moist.  ?   Pharynx: Oropharynx is clear.  ?Eyes:  ?   Extraocular Movements: Extraocular movements intact.  ?   Conjunctiva/sclera: Conjunctivae normal.   ?   Pupils: Pupils are equal, round, and reactive to light.  ?Cardiovascular:  ?   Rate and Rhythm: Normal rate and regular rhythm.  ?   Pulses: Normal pulses.  ?   Heart sounds: Normal heart sounds.  ?Pulmonary:  ?   Effort: Pulmonary effort is normal.  ?   Breath sounds: Normal breath sounds.  ?Abdominal:  ?   Palpations: Abdomen is soft.  ?Musculoskeletal:     ?   General: Normal range of motion.  ?   Cervical back: Normal range of motion and neck supple.  ?Lymphadenopathy:  ?   Cervical: No cervical adenopathy.  ?Skin: ?   General: Skin is warm and dry.  ?  Capillary Refill: Capillary refill takes less than 2 seconds.  ?Neurological:  ?   General: No focal deficit present.  ?   Mental Status: She is alert and oriented to person, place, and time.  ?Psychiatric:     ?   Mood and Affect: Mood normal.     ?   Behavior: Behavior normal.     ?   Thought Content: Thought content normal.     ?   Judgment: Judgment normal.  ?  ? ? Assessment & Plan  ?  ?1. Intractable migraine with aura without status migrainosus ?Patient should take Maxalt 10 mg tablets as needed and as prescribed for acute migraines.  New prescription sent to pharmacy today. ?- rizatriptan (MAXALT) 10 MG tablet; Take 1 tablet (10 mg total) by mouth as needed for migraine. May repeat in 2 hours if needed  Dispense: 10 tablet; Refill: 5 ? ?2. Essential tremor ?Patient taking primidone.  Continue regular visits with neurology as scheduled. ? ?3. Mild intermittent asthma without complication ?Continue inhalers and respiratory medications as prescribed. ? ?4. Body mass index (BMI) of 40.1-44.9 in adult [facility]) ?Discussed lowering calorie intake to 1500 calories per day and incorporating exercise into daily routine to help lose weight.  She plans to [facility]. ? ?5. Pre-diabetes ?Continue metformin as prescribed.  Also continue [facility]. ? ?Problem List Items  Addressed This Visit   ? ?  ? Cardiovascular and Mediastinum  ? Intractable migraine with aura without status migrainosus - Primary  ? Relevant Medications  ? rizatriptan (MAXALT) 10 MG tablet  ?  ? Respi

## 2022-02-20 ENCOUNTER — Other Ambulatory Visit (INDEPENDENT_AMBULATORY_CARE_PROVIDER_SITE_OTHER): Payer: Self-pay | Admitting: Family Medicine

## 2022-02-20 DIAGNOSIS — R7303 Prediabetes: Secondary | ICD-10-CM

## 2022-02-23 DIAGNOSIS — R7303 Prediabetes: Secondary | ICD-10-CM | POA: Insufficient documentation

## 2022-03-03 ENCOUNTER — Encounter (INDEPENDENT_AMBULATORY_CARE_PROVIDER_SITE_OTHER): Payer: Self-pay | Admitting: Family Medicine

## 2022-03-03 ENCOUNTER — Ambulatory Visit (INDEPENDENT_AMBULATORY_CARE_PROVIDER_SITE_OTHER): Payer: 59 | Admitting: Family Medicine

## 2022-03-03 VITALS — BP 126/86 | HR 94 | Temp 98.2°F | Ht 64.0 in | Wt 255.0 lb

## 2022-03-03 DIAGNOSIS — F3289 Other specified depressive episodes: Secondary | ICD-10-CM | POA: Diagnosis not present

## 2022-03-03 DIAGNOSIS — Z9189 Other specified personal risk factors, not elsewhere classified: Secondary | ICD-10-CM

## 2022-03-03 DIAGNOSIS — Z6841 Body Mass Index (BMI) 40.0 and over, adult: Secondary | ICD-10-CM

## 2022-03-03 DIAGNOSIS — E669 Obesity, unspecified: Secondary | ICD-10-CM | POA: Diagnosis not present

## 2022-03-03 MED ORDER — BUPROPION HCL ER (SR) 200 MG PO TB12: 200.0000 mg | ORAL_TABLET | Freq: Every day | ORAL | 0 refills | Status: DC

## 2022-03-04 ENCOUNTER — Ambulatory Visit (INDEPENDENT_AMBULATORY_CARE_PROVIDER_SITE_OTHER): Payer: 59 | Admitting: Family Medicine

## 2022-03-04 NOTE — Progress Notes (Signed)
? ? ? ?Chief Complaint:  ? ?OBESITY ?Sarah Fields is here to discuss her progress with her obesity treatment plan along with follow-up of her obesity related diagnoses. Sarah Fields is on following a lower carbohydrate, vegetable and lean protein rich diet plan and states she is following her eating plan approximately 77% of the time. [name] states she is doing some extra walking for 45 minutes 2 times per week. ? ?Today's visit was #: 25 ?Starting weight: 295 lbs ?Starting date: 12/13/2020 ?Today's weight: 255 lbs ?Today's date: 03/03/2022 ?Total lbs lost to date: 40 ?Total lbs lost since last in-office visit: 0 ? ?Interim History: Sarah Fields did more traveling recently and she got off track with her eating plan. She is working on getting back on track with her plan. ? ?Subjective:  ? ?1. Other depression with emotional eating ?[name] is working on [employer] behaviors. She is doing well with recognizing stress/comfort eating. No side effects were noted.  ? ?2. At risk for impaired metabolic function ?Sarah Fields is at increased risk for impaired metabolic function due to current nutrition and muscle mass. ? ?Assessment/Plan:  ? ?1. Other depression with emotional eating ?We will refill Wellbutrin SR for 1 month. Behavior modification techniques were discussed today to help Sarah Fields deal with her emotional/non-hunger eating behaviors.  Orders and follow up as documented in patient record.  ? ?- buPROPion (WELLBUTRIN SR) 200 MG 12 hr tablet; Take 1 tablet (200 mg total) by mouth daily with breakfast.  Dispense: 30 tablet; Refill: 0 ? ?2. At risk for impaired metabolic function ?[name] was given approximately 15 minutes of impaired  metabolic function prevention counseling today. We discussed intensive lifestyle modifications today with an emphasis on specific nutrition and exercise instructions and strategies.  ? ?Repetitive spaced learning was employed today to elicit superior memory formation and  behavioral change. ? ?3. Obesity with current BMI of 43.8 ?Sarah Fields is currently in the action stage of change. As such, her goal is to continue with weight loss efforts. She has agreed to the Category 2 Plan.  ? ?Exercise goals: As is. ? ?Behavioral modification strategies: increasing lean protein intake and keeping a strict food journal. ? ?Sarah Fields has agreed to follow-up with our clinic in 3 weeks. She was informed of the importance of frequent follow-up visits to maximize her success with intensive lifestyle modifications for her multiple health conditions.  ? ?Objective:  ? ?Blood pressure 126/86, pulse 94, temperature 98.2 ?F (36.8 ?C), height 5\' 4"  (1.626 m), weight 255 lb (115.7 kg), SpO2 98 %. ?Body mass index is 43.77 kg/m?. ? ?General: Cooperative, alert, well developed, in no acute distress. ?HEENT: Conjunctivae and lids unremarkable. ?Cardiovascular: Regular rhythm.  ?Lungs: Normal work of breathing. ?Neurologic: No focal deficits.  ? ?Lab Results  ?Component Value Date  ? CREATININE 0.95 02/11/2022  ? BUN 20 02/11/2022  ? NA 138 02/11/2022  ? K 5.1 02/11/2022  ? CL 101 02/11/2022  ? CO2 23 02/11/2022  ? ?Lab Results  ?Component Value Date  ? ALT 11 02/11/2022  ? AST 17 02/11/2022  ? ALKPHOS 93 02/11/2022  ? BILITOT 0.4 02/11/2022  ? ?Lab Results  ?Component Value Date  ? HGBA1C 5.4 02/11/2022  ? HGBA1C 5.5 07/31/2021  ? HGBA1C 5.4 12/13/2020  ? HGBA1C 5.5 05/30/2015  ? ?Lab Results  ?Component Value Date  ? INSULIN 17.3 02/11/2022  ? INSULIN 19.6 07/31/2021  ? INSULIN 21.4 12/13/2020  ? ?Lab Results  ?Component Value Date  ? TSH 4.200 12/13/2020  ? ?  Lab Results  ?Component Value Date  ? CHOL 189 02/11/2022  ? HDL 41 02/11/2022  ? LDLCALC 117 (H) 02/11/2022  ? TRIG 173 (H) 02/11/2022  ? CHOLHDL 4.5 05/30/2015  ? ?Lab Results  ?Component Value Date  ? VD25OH 64.7 02/11/2022  ? VD25OH 112.0 (H) 07/31/2021  ? VD25OH 28.4 (L) 12/13/2020  ? ?Lab Results  ?Component Value Date  ? WBC 9.9 09/07/2021  ? HGB  14.2 09/07/2021  ? HCT 42.8 09/07/2021  ? MCV 93.4 09/07/2021  ? PLT 276 09/07/2021  ? ?No results found for: IRON, TIBC, FERRITIN ? ?Attestation Statements:  ? ?Reviewed by clinician on day of visit: allergies, medications, problem list, medical history, surgical history, family history, social history, and previous encounter notes. ? ? ?I, Burt Knack, am acting as transcriptionist for Quillian Quince, MD. ? ?I have reviewed the above documentation for accuracy and completeness, and I agree with the above. -  Quillian Quince, MD ? ? ?

## 2022-03-15 ENCOUNTER — Encounter

## 2022-03-18 ENCOUNTER — Other Ambulatory Visit (INDEPENDENT_AMBULATORY_CARE_PROVIDER_SITE_OTHER): Payer: Self-pay | Admitting: Family Medicine

## 2022-03-18 DIAGNOSIS — R7303 Prediabetes: Secondary | ICD-10-CM

## 2022-03-25 ENCOUNTER — Ambulatory Visit (INDEPENDENT_AMBULATORY_CARE_PROVIDER_SITE_OTHER): Payer: 59 | Admitting: Family Medicine

## 2022-03-25 ENCOUNTER — Encounter (INDEPENDENT_AMBULATORY_CARE_PROVIDER_SITE_OTHER): Payer: Self-pay | Admitting: Family Medicine

## 2022-03-25 VITALS — BP 118/76 | HR 89 | Temp 97.9°F | Ht 64.0 in | Wt 253.0 lb

## 2022-03-25 DIAGNOSIS — R7303 Prediabetes: Secondary | ICD-10-CM

## 2022-03-25 DIAGNOSIS — F3289 Other specified depressive episodes: Secondary | ICD-10-CM | POA: Diagnosis not present

## 2022-03-25 DIAGNOSIS — Z6841 Body Mass Index (BMI) 40.0 and over, adult: Secondary | ICD-10-CM

## 2022-03-25 DIAGNOSIS — E66813 Obesity, class 3: Secondary | ICD-10-CM

## 2022-03-25 DIAGNOSIS — E669 Obesity, unspecified: Secondary | ICD-10-CM | POA: Diagnosis not present

## 2022-03-25 DIAGNOSIS — F32A Depression, unspecified: Secondary | ICD-10-CM | POA: Insufficient documentation

## 2022-03-25 MED ORDER — BUPROPION HCL ER (SR) 200 MG PO TB12: 200.0000 mg | ORAL_TABLET | Freq: Every day | ORAL | 0 refills | Status: DC

## 2022-03-25 MED ORDER — METFORMIN HCL 500 MG PO TABS: ORAL_TABLET | ORAL | 0 refills | Status: DC

## 2022-04-08 ENCOUNTER — Other Ambulatory Visit (INDEPENDENT_AMBULATORY_CARE_PROVIDER_SITE_OTHER): Payer: Self-pay | Admitting: Family Medicine

## 2022-04-08 DIAGNOSIS — R7303 Prediabetes: Secondary | ICD-10-CM

## 2022-04-09 NOTE — Progress Notes (Signed)
Chief Complaint:   OBESITY Sarah Fields is here to discuss her progress with her obesity treatment plan along with follow-up of her obesity related diagnoses. [facility] is on following a lower carbohydrate, vegetable and lean protein rich diet plan and states she is following her eating plan approximately 80% of the time. Sarah Fields states she is doing 0 minutes 0 times per week.  Today's visit was #: 26 Starting weight: 295 lbs Starting date: 12/13/2020 Today's weight: 253 lbs Today's date: 03/25/2022 Total lbs lost to date: 42 Total lbs lost since last in-office visit: 2  Interim History: Sarah Fields continues to do well with weight loss. She is doing better with lower carbohydrates overall, but she has deviated more but still doing better with portion control and making smarter choices.   Subjective:   1. Pre-diabetes Sarah Fields is stable on metformin, and she is doing well with decreasing simple carbohydrates.   2. Other depression with emotional eating Sarah Fields's mood is stable on Wellbutrin, with no side effects noted. She is doing well with decreasing emotional eating behaviors.   Assessment/Plan:   1. Pre-diabetes We will refill metformin for 1 month. Sarah Fields will continue to work on weight loss, exercise, and decreasing simple carbohydrates to help decrease the risk of diabetes.   - metFORMIN (GLUCOPHAGE) 500 MG tablet; TAKE 1 TABLET BY MOUTH EVERY DAY WITH BREAKFAST  Dispense: 30 tablet; Refill: 0  2. Other depression with emotional eating We will refill Wellbutrin SR for 1 month. Behavior modification techniques were discussed today to help Sarah Fields deal with her emotional/non-hunger eating behaviors.  Orders and follow up as documented in patient record.   - buPROPion (WELLBUTRIN SR) 200 MG 12 hr tablet; Take 1 tablet (200 mg total) by mouth daily with breakfast.  Dispense: 30 tablet; Refill: 0  3. Obesity, Current BMI 43.4 Sarah Fields is currently in the action stage of  change. As such, her goal is to continue with weight loss efforts. She has agreed to following a lower carbohydrate, vegetable and lean protein rich diet plan.   Behavioral modification strategies: increasing lean protein intake and increasing water intake.  Sarah Fields has agreed to follow-up with our clinic in 3 weeks. She was informed of the importance of frequent follow-up visits to maximize her success with intensive lifestyle modifications for her multiple health conditions.   Objective:   Blood pressure 118/76, pulse 89, temperature 97.9 F (36.6 C), height 5\' 4"  (1.626 m), weight 253 lb (114.8 kg), SpO2 97 %. Body mass index is 43.43 kg/m.  General: Cooperative, alert, well developed, in no acute distress. HEENT: Conjunctivae and lids unremarkable. Cardiovascular: Regular rhythm.  Lungs: Normal work of breathing. Neurologic: No focal deficits.   Lab Results  Component Value Date   CREATININE 0.95 02/11/2022   BUN 20 02/11/2022   NA 138 02/11/2022   K 5.1 02/11/2022   CL 101 02/11/2022   CO2 23 02/11/2022   Lab Results  Component Value Date   ALT 11 02/11/2022   AST 17 02/11/2022   ALKPHOS 93 02/11/2022   BILITOT 0.4 02/11/2022   Lab Results  Component Value Date   HGBA1C 5.4 02/11/2022   HGBA1C 5.5 07/31/2021   HGBA1C 5.4 12/13/2020   HGBA1C 5.5 05/30/2015   Lab Results  Component Value Date   INSULIN 17.3 02/11/2022   INSULIN 19.6 07/31/2021   INSULIN 21.4 12/13/2020   Lab Results  Component Value Date   TSH 4.200 12/13/2020   Lab Results  Component Value Date  CHOL 189 02/11/2022   HDL 41 02/11/2022   LDLCALC 117 (H) 02/11/2022   TRIG 173 (H) 02/11/2022   CHOLHDL 4.5 05/30/2015   Lab Results  Component Value Date   VD25OH 64.7 02/11/2022   VD25OH 112.0 (H) 07/31/2021   VD25OH 28.4 (L) 12/13/2020   Lab Results  Component Value Date   WBC 9.9 09/07/2021   HGB 14.2 09/07/2021   HCT 42.8 09/07/2021   MCV 93.4 09/07/2021   PLT 276 09/07/2021    No results found for: IRON, TIBC, FERRITIN  Attestation Statements:   Reviewed by clinician on day of visit: allergies, medications, problem list, medical history, surgical history, family history, social history, and previous encounter notes.   I, Burt Knack, am acting as transcriptionist for Quillian Quince, MD.  I have reviewed the above documentation for accuracy and completeness, and I agree with the above. -  Quillian Quince, MD

## 2022-04-15 ENCOUNTER — Encounter (INDEPENDENT_AMBULATORY_CARE_PROVIDER_SITE_OTHER): Payer: Self-pay | Admitting: Family Medicine

## 2022-04-15 ENCOUNTER — Other Ambulatory Visit (INDEPENDENT_AMBULATORY_CARE_PROVIDER_SITE_OTHER): Payer: Self-pay | Admitting: Family Medicine

## 2022-04-15 ENCOUNTER — Ambulatory Visit (INDEPENDENT_AMBULATORY_CARE_PROVIDER_SITE_OTHER): Payer: 59 | Admitting: Family Medicine

## 2022-04-15 VITALS — BP 113/74 | HR 93 | Temp 98.3°F | Ht 64.0 in | Wt 254.0 lb

## 2022-04-15 DIAGNOSIS — Z7984 Long term (current) use of oral hypoglycemic drugs: Secondary | ICD-10-CM | POA: Diagnosis not present

## 2022-04-15 DIAGNOSIS — Z6841 Body Mass Index (BMI) 40.0 and over, adult: Secondary | ICD-10-CM

## 2022-04-15 DIAGNOSIS — E669 Obesity, unspecified: Secondary | ICD-10-CM | POA: Diagnosis not present

## 2022-04-15 DIAGNOSIS — R7303 Prediabetes: Secondary | ICD-10-CM

## 2022-04-15 MED ORDER — SEMAGLUTIDE-WEIGHT MANAGEMENT 0.25 MG/0.5ML ~~LOC~~ SOAJ: 0.2500 mg | SUBCUTANEOUS | 0 refills | Status: DC

## 2022-04-15 NOTE — Telephone Encounter (Signed)
[facility] and let the patient know if they have it there

## 2022-04-15 NOTE — Telephone Encounter (Signed)
Dr.Beasley 

## 2022-04-16 ENCOUNTER — Telehealth (INDEPENDENT_AMBULATORY_CARE_PROVIDER_SITE_OTHER): Payer: Self-pay | Admitting: Family Medicine

## 2022-04-16 ENCOUNTER — Other Ambulatory Visit (HOSPITAL_COMMUNITY): Payer: Self-pay

## 2022-04-16 MED ORDER — SEMAGLUTIDE-WEIGHT MANAGEMENT 0.25 MG/0.5ML ~~LOC~~ SOAJ
0.2500 mg | SUBCUTANEOUS | 0 refills | Status: DC
  Filled 2022-04-16: qty 2, 28d supply, fill #0

## 2022-04-16 NOTE — Telephone Encounter (Signed)
Pt returned a call from Lavone Orn. Please call pt at [phone].   [facility].

## 2022-04-16 NOTE — Telephone Encounter (Signed)
Left message for patient to call back and let me know which pharmacy she would like this sent to for pick up. Also sent message via Mychart.

## 2022-04-16 NOTE — Telephone Encounter (Signed)
Patient would like Rx sent to [place].

## 2022-04-17 ENCOUNTER — Encounter (INDEPENDENT_AMBULATORY_CARE_PROVIDER_SITE_OTHER): Payer: Self-pay

## 2022-04-17 ENCOUNTER — Telehealth (INDEPENDENT_AMBULATORY_CARE_PROVIDER_SITE_OTHER): Payer: Self-pay | Admitting: Family Medicine

## 2022-04-17 NOTE — Telephone Encounter (Signed)
Duplicate message, please refer to previous encounter for details.

## 2022-04-17 NOTE — Telephone Encounter (Signed)
Dr. Beasley - Prior authorization denied for [employer]. Per insurance: not a covered benefit/plan exclusion. Patient sent denial message via mychart.  

## 2022-04-28 NOTE — Progress Notes (Signed)
Chief Complaint:   OBESITY Sarah Fields is here to discuss her progress with her obesity treatment plan along with follow-up of her obesity related diagnoses. [name] is on following a lower carbohydrate, vegetable and lean protein rich diet plan and states she is following her eating plan approximately 85% of the time. [name] states she is doing 0 minutes 0 times per week.  Today's visit was #: 27 Starting weight: 295 lbs Starting date: 12/13/2020 Today's weight: 254 lbs Today's date: 04/15/2022 Total lbs lost to date: 41 Total lbs lost since last in-office visit: 0  Interim History: [name] has been struggling with weight loss. She is open to looking at medication options.   Subjective:   1. Pre-diabetes [name] stable on medications.   Assessment/Plan:   1. Pre-diabetes [name] will continue her metformin, and start her GLP-1 and will follow up. She will continue to work on weight loss, exercise, and decreasing simple carbohydrates to help decrease the risk of diabetes.   2. Obesity, Current BMI 43.7 [name] is currently in the action stage of change. As such, her goal is to continue with weight loss efforts. She has agreed to following a lower carbohydrate, vegetable and lean protein rich diet plan.   We discussed various medication options to help [name] with her weight loss efforts and we both agreed to start Wegovy 0.25 mg q week with no refills.  Behavioral modification strategies: increasing lean protein intake and meal planning and cooking strategies.  [name] has agreed to follow-up with our clinic in 3 weeks. She was informed of the importance of frequent follow-up visits to maximize her success with intensive lifestyle modifications for her multiple health conditions.   Objective:   Blood pressure 113/74, pulse 93, temperature 98.3 F (36.8 C), height 5\' 4"  (1.626 m), weight 254 lb (115.2 kg), SpO2 98 %. Body mass index is 43.6 kg/m.  General:  Cooperative, alert, well developed, in no acute distress. HEENT: Conjunctivae and lids unremarkable. Cardiovascular: Regular rhythm.  Lungs: Normal work of breathing. Neurologic: No focal deficits.   Lab Results  Component Value Date   CREATININE 0.95 02/11/2022   BUN 20 02/11/2022   NA 138 02/11/2022   K 5.1 02/11/2022   CL 101 02/11/2022   CO2 23 02/11/2022   Lab Results  Component Value Date   ALT 11 02/11/2022   AST 17 02/11/2022   ALKPHOS 93 02/11/2022   BILITOT 0.4 02/11/2022   Lab Results  Component Value Date   HGBA1C 5.4 02/11/2022   HGBA1C 5.5 07/31/2021   HGBA1C 5.4 12/13/2020   HGBA1C 5.5 05/30/2015   Lab Results  Component Value Date   INSULIN 17.3 02/11/2022   INSULIN 19.6 07/31/2021   INSULIN 21.4 12/13/2020   Lab Results  Component Value Date   TSH 4.200 12/13/2020   Lab Results  Component Value Date   CHOL 189 02/11/2022   HDL 41 02/11/2022   LDLCALC 117 (H) 02/11/2022   TRIG 173 (H) 02/11/2022   CHOLHDL 4.5 05/30/2015   Lab Results  Component Value Date   VD25OH 64.7 02/11/2022   VD25OH 112.0 (H) 07/31/2021   VD25OH 28.4 (L) 12/13/2020   Lab Results  Component Value Date   WBC 9.9 09/07/2021   HGB 14.2 09/07/2021   HCT 42.8 09/07/2021   MCV 93.4 09/07/2021   PLT 276 09/07/2021   No results found for: IRON, TIBC, FERRITIN  Attestation Statements:   Reviewed by clinician on day of visit: allergies, medications, problem list,  medical history, surgical history, family history, social history, and previous encounter notes.  Time spent on visit including pre-visit chart review and post-visit care and charting was 40 minutes.   I, Burt Knack, am acting as transcriptionist for Quillian Quince, MD.  I have reviewed the above documentation for accuracy and completeness, and I agree with the above. -  Quillian Quince, MD

## 2022-05-06 ENCOUNTER — Other Ambulatory Visit (INDEPENDENT_AMBULATORY_CARE_PROVIDER_SITE_OTHER): Payer: Self-pay | Admitting: Family Medicine

## 2022-05-06 ENCOUNTER — Ambulatory Visit (INDEPENDENT_AMBULATORY_CARE_PROVIDER_SITE_OTHER): Payer: 59 | Admitting: Family Medicine

## 2022-05-06 ENCOUNTER — Encounter (INDEPENDENT_AMBULATORY_CARE_PROVIDER_SITE_OTHER): Payer: Self-pay | Admitting: Family Medicine

## 2022-05-06 VITALS — BP 130/84 | HR 89 | Temp 98.5°F | Ht 64.0 in | Wt 255.0 lb

## 2022-05-06 DIAGNOSIS — R7303 Prediabetes: Secondary | ICD-10-CM

## 2022-05-06 DIAGNOSIS — E559 Vitamin D deficiency, unspecified: Secondary | ICD-10-CM | POA: Diagnosis not present

## 2022-05-06 DIAGNOSIS — Z6841 Body Mass Index (BMI) 40.0 and over, adult: Secondary | ICD-10-CM

## 2022-05-06 DIAGNOSIS — Z9189 Other specified personal risk factors, not elsewhere classified: Secondary | ICD-10-CM

## 2022-05-06 DIAGNOSIS — E669 Obesity, unspecified: Secondary | ICD-10-CM

## 2022-05-06 DIAGNOSIS — F3289 Other specified depressive episodes: Secondary | ICD-10-CM | POA: Diagnosis not present

## 2022-05-06 DIAGNOSIS — Z7984 Long term (current) use of oral hypoglycemic drugs: Secondary | ICD-10-CM

## 2022-05-06 MED ORDER — METFORMIN HCL 500 MG PO TABS: 500.0000 mg | ORAL_TABLET | Freq: Two times a day (BID) | ORAL | 0 refills | Status: DC

## 2022-05-06 MED ORDER — BUPROPION HCL ER (SR) 200 MG PO TB12: 200.0000 mg | ORAL_TABLET | Freq: Every day | ORAL | 0 refills | Status: DC

## 2022-05-06 NOTE — Progress Notes (Unsigned)
Chief Complaint:   OBESITY Sarah Fields is here to discuss her progress with her obesity treatment plan along with follow-up of her obesity related diagnoses. [name] is on following a lower carbohydrate, vegetable and lean protein rich diet plan and states she is following her eating plan approximately 60% of the time. [name] states she is doing 0 minutes 0 times per week.  Today's visit was #: 28 Starting weight: 295 lbs Starting date: 12/13/2020 Today's weight: 255 lbs Today's date: 05/06/2022 Total lbs lost to date: 40 Total lbs lost since last in-office visit: 0  Interim History: Sarah Fields has increased traveling recently and she has struggled to follow her low carbohydrate plan as she strongly.  She has worked on increasing her protein and portion control.  Subjective:   1. Vitamin D deficiency Sarah Fields has been off vitamin D as her level was too high.  It is at goal now.  2. Pre-diabetes Sarah Fields notes increase PM polyphagia.  3. Other depression with emotional eating Sarah Fields is stable on her medications, and she is working on her diet and exercise.  4. At risk for impaired metabolic function Sarah Fields is at increased risk for impaired metabolic function if calories drop too low.   Assessment/Plan:   1. Vitamin D deficiency Sarah Fields agreed to discontinue prescription Vitamin D, and she will continue OTC vitamin D plus calcium.  She will follow-up for routine testing of Vitamin D, at least 2-3 times per year to avoid over-replacement.  2. Pre-diabetes Sarah Fields agreed to increase metformin to 500 mg twice daily, and we will refill for 1 month.  She will continue to work on weight loss, exercise, and decreasing simple carbohydrates to help decrease the risk of diabetes.   - metFORMIN (GLUCOPHAGE) 500 MG tablet; Take 1 tablet (500 mg total) by mouth 2 (two) times daily with a meal. TAKE 1 TABLET BY MOUTH EVERY DAY WITH BREAKFAST  Dispense: 60 tablet; Refill: 0  3. Other  depression with emotional eating We will refill Wellbutrin SR for 1 month.  Behavior modification techniques were discussed today to help Sarah Fields deal with her emotional/non-hunger eating behaviors.  Orders and follow up as documented in patient record.   - buPROPion (WELLBUTRIN SR) 200 MG 12 hr tablet; Take 1 tablet (200 mg total) by mouth daily with breakfast.  Dispense: 30 tablet; Refill: 0  4. At risk for impaired metabolic function Sarah Fields was given approximately 15 minutes of impaired  metabolic function prevention counseling today. We discussed intensive lifestyle modifications today with an emphasis on specific nutrition and exercise instructions and strategies.   Repetitive spaced learning was employed today to elicit superior memory formation and behavioral change.  5. Obesity, Current BMI 43.8 Sarah Fields is currently in the action stage of change. As such, her goal is to continue with weight loss efforts. She has agreed to change to keeping a food journal and adhering to recommended goals of 1300-1500 calories and 90+ grams of protein daily.   Behavioral modification strategies: increasing lean protein intake.  Sarah Fields has agreed to follow-up with our clinic in 3 weeks. She was informed of the importance of frequent follow-up visits to maximize her success with intensive lifestyle modifications for her multiple health conditions.   Objective:   Blood pressure 130/84, pulse 89, temperature 98.5 F (36.9 C), height 5\' 4"  (1.626 m), weight 255 lb (115.7 kg), SpO2 97 %. Body mass index is 43.77 kg/m.  General: Cooperative, alert, well developed, in no acute distress. HEENT: Conjunctivae and lids  unremarkable. Cardiovascular: Regular rhythm.  Lungs: Normal work of breathing. Neurologic: No focal deficits.   Lab Results  Component Value Date   CREATININE 0.95 02/11/2022   BUN 20 02/11/2022   NA 138 02/11/2022   K 5.1 02/11/2022   CL 101 02/11/2022   CO2 23 02/11/2022    Lab Results  Component Value Date   ALT 11 02/11/2022   AST 17 02/11/2022   ALKPHOS 93 02/11/2022   BILITOT 0.4 02/11/2022   Lab Results  Component Value Date   HGBA1C 5.4 02/11/2022   HGBA1C 5.5 07/31/2021   HGBA1C 5.4 12/13/2020   HGBA1C 5.5 05/30/2015   Lab Results  Component Value Date   INSULIN 17.3 02/11/2022   INSULIN 19.6 07/31/2021   INSULIN 21.4 12/13/2020   Lab Results  Component Value Date   TSH 4.200 12/13/2020   Lab Results  Component Value Date   CHOL 189 02/11/2022   HDL 41 02/11/2022   LDLCALC 117 (H) 02/11/2022   TRIG 173 (H) 02/11/2022   CHOLHDL 4.5 05/30/2015   Lab Results  Component Value Date   VD25OH 64.7 02/11/2022   VD25OH 112.0 (H) 07/31/2021   VD25OH 28.4 (L) 12/13/2020   Lab Results  Component Value Date   WBC 9.9 09/07/2021   HGB 14.2 09/07/2021   HCT 42.8 09/07/2021   MCV 93.4 09/07/2021   PLT 276 09/07/2021   No results found for: "IRON", "TIBC", "FERRITIN"  Attestation Statements:   Reviewed by clinician on day of visit: allergies, medications, problem list, medical history, surgical history, family history, social history, and previous encounter notes.   I, Burt Knack, am acting as transcriptionist for Quillian Quince, MD.  I have reviewed the above documentation for accuracy and completeness, and I agree with the above. -  Quillian Quince, MD

## 2022-05-28 ENCOUNTER — Encounter (INDEPENDENT_AMBULATORY_CARE_PROVIDER_SITE_OTHER): Payer: Self-pay | Admitting: Family Medicine

## 2022-05-28 ENCOUNTER — Ambulatory Visit (INDEPENDENT_AMBULATORY_CARE_PROVIDER_SITE_OTHER): Payer: 59 | Admitting: Family Medicine

## 2022-05-28 VITALS — BP 128/85 | HR 93 | Temp 98.9°F | Ht 64.0 in | Wt 256.4 lb

## 2022-05-28 DIAGNOSIS — E669 Obesity, unspecified: Secondary | ICD-10-CM | POA: Diagnosis not present

## 2022-05-28 DIAGNOSIS — R7303 Prediabetes: Secondary | ICD-10-CM

## 2022-05-28 DIAGNOSIS — F3289 Other specified depressive episodes: Secondary | ICD-10-CM | POA: Diagnosis not present

## 2022-05-28 DIAGNOSIS — Z6841 Body Mass Index (BMI) 40.0 and over, adult: Secondary | ICD-10-CM | POA: Diagnosis not present

## 2022-05-28 DIAGNOSIS — Z7984 Long term (current) use of oral hypoglycemic drugs: Secondary | ICD-10-CM

## 2022-05-28 DIAGNOSIS — E66813 Obesity, class 3: Secondary | ICD-10-CM

## 2022-05-28 MED ORDER — BUPROPION HCL ER (SR) 200 MG PO TB12: 200.0000 mg | ORAL_TABLET | Freq: Every day | ORAL | 0 refills | Status: DC

## 2022-05-28 MED ORDER — METFORMIN HCL 500 MG PO TABS: 500.0000 mg | ORAL_TABLET | Freq: Two times a day (BID) | ORAL | 0 refills | Status: DC

## 2022-05-29 NOTE — Progress Notes (Signed)
Chief Complaint:   OBESITY Sarah Fields is here to discuss her progress with her obesity treatment plan along with follow-up of her obesity related diagnoses. Sarah Fields is on keeping a food journal and adhering to recommended goals of 1300-1500 calories and 90+ grams of protein daily and states she is following her eating plan approximately 80% of the time. Sarah Fields states she is bike riding for 5 minutes 7 times per week.  Today's visit was #: 29 Starting weight: 295 lbs Starting date: 12/13/2020 Today's weight: 256 lbs Today's date: 05/28/2022 Total lbs lost to date: 39 Total lbs lost since last in-office visit: 0  Interim History: Sarah Fields continues to work on her weight loss.  She has started to exercise regularly and she feels well overall.  She is trying to journal, but she is struggling.  She may do better with a more structured plan.  Subjective:   1. Pre-diabetes Sarah Fields increase metformin to twice daily, but she struggles to take her second dose.  2. Other depression, emotional eating behaviors Sarah Fields is stable on Wellbutrin, with no side effects noted.  She requests a refill.  Assessment/Plan:   1. Pre-diabetes We will refill metformin 500 mg twice daily for 1 month (patient okay to take both doses in the a.m.). Sarah Fields will continue to work on weight loss, exercise, and decreasing simple carbohydrates to help decrease the risk of diabetes.   - metFORMIN (GLUCOPHAGE) 500 MG tablet; Take 1 tablet (500 mg total) by mouth 2 (two) times daily with a meal. TAKE 1 TABLET BY MOUTH EVERY DAY WITH BREAKFAST  Dispense: 60 tablet; Refill: 0  2. Other depression, emotional eating behaviors Sarah Fields will continue Wellbutrin SR 200 mg daily, and we will refill for 1 month.  - buPROPion (WELLBUTRIN SR) 200 MG 12 hr tablet; Take 1 tablet (200 mg total) by mouth daily with breakfast.  Dispense: 30 tablet; Refill: 0  3. Obesity, Current BMI 44.0 Sarah Fields is currently in the action  stage of change. As such, her goal is to continue with weight loss efforts. She has agreed to the Category 3 Plan or keeping a food journal and adhering to recommended goals of 1300-1500 calories and 90+ grams of protein daily.   We will recheck fasting labs at her next visit. Exercise goals: As is.  Behavioral modification strategies: increasing water intake.  Sarah Fields has agreed to follow-up with our clinic in 3 weeks. She was informed of the importance of frequent follow-up visits to maximize her success with intensive lifestyle modifications for her multiple health conditions.   Objective:   Blood pressure 128/85, pulse 93, temperature 98.9 F (37.2 C), height 5\' 4"  (1.626 m), weight 256 lb 6.4 oz (116.3 kg), SpO2 97 %. Body mass index is 44.01 kg/m.  General: Cooperative, alert, well developed, in no acute distress. HEENT: Conjunctivae and lids unremarkable. Cardiovascular: Regular rhythm.  Lungs: Normal work of breathing. Neurologic: No focal deficits.   Lab Results  Component Value Date   CREATININE 0.95 02/11/2022   BUN 20 02/11/2022   NA 138 02/11/2022   K 5.1 02/11/2022   CL 101 02/11/2022   CO2 23 02/11/2022   Lab Results  Component Value Date   ALT 11 02/11/2022   AST 17 02/11/2022   ALKPHOS 93 02/11/2022   BILITOT 0.4 02/11/2022   Lab Results  Component Value Date   HGBA1C 5.4 02/11/2022   HGBA1C 5.5 07/31/2021   HGBA1C 5.4 12/13/2020   HGBA1C 5.5 05/30/2015   Lab Results  Component Value Date   INSULIN 17.3 02/11/2022   INSULIN 19.6 07/31/2021   INSULIN 21.4 12/13/2020   Lab Results  Component Value Date   TSH 4.200 12/13/2020   Lab Results  Component Value Date   CHOL 189 02/11/2022   HDL 41 02/11/2022   LDLCALC 117 (H) 02/11/2022   TRIG 173 (H) 02/11/2022   CHOLHDL 4.5 05/30/2015   Lab Results  Component Value Date   VD25OH 64.7 02/11/2022   VD25OH 112.0 (H) 07/31/2021   VD25OH 28.4 (L) 12/13/2020   Lab Results  Component Value  Date   WBC 9.9 09/07/2021   HGB 14.2 09/07/2021   HCT 42.8 09/07/2021   MCV 93.4 09/07/2021   PLT 276 09/07/2021   No results found for: "IRON", "TIBC", "FERRITIN"  Attestation Statements:   Reviewed by clinician on day of visit: allergies, medications, problem list, medical history, surgical history, family history, social history, and previous encounter notes.   I, Burt Knack, am acting as Energy manager for Quillian Quince, MD.  I have reviewed the above documentation for accuracy and completeness, and I agree with the above. -  Quillian Quince, MD

## 2022-06-02 ENCOUNTER — Other Ambulatory Visit (INDEPENDENT_AMBULATORY_CARE_PROVIDER_SITE_OTHER): Payer: Self-pay | Admitting: Family Medicine

## 2022-06-02 DIAGNOSIS — R7303 Prediabetes: Secondary | ICD-10-CM

## 2022-06-06 ENCOUNTER — Other Ambulatory Visit (INDEPENDENT_AMBULATORY_CARE_PROVIDER_SITE_OTHER): Payer: Self-pay | Admitting: Family Medicine

## 2022-06-06 DIAGNOSIS — R7303 Prediabetes: Secondary | ICD-10-CM

## 2022-06-19 ENCOUNTER — Ambulatory Visit (INDEPENDENT_AMBULATORY_CARE_PROVIDER_SITE_OTHER): Payer: 59 | Admitting: Family Medicine

## 2022-06-19 ENCOUNTER — Encounter (INDEPENDENT_AMBULATORY_CARE_PROVIDER_SITE_OTHER): Payer: Self-pay | Admitting: Family Medicine

## 2022-06-19 VITALS — BP 134/80 | HR 85 | Temp 98.0°F | Ht 64.0 in | Wt 257.0 lb

## 2022-06-19 DIAGNOSIS — R7303 Prediabetes: Secondary | ICD-10-CM | POA: Diagnosis not present

## 2022-06-19 DIAGNOSIS — E538 Deficiency of other specified B group vitamins: Secondary | ICD-10-CM | POA: Diagnosis not present

## 2022-06-19 DIAGNOSIS — E559 Vitamin D deficiency, unspecified: Secondary | ICD-10-CM | POA: Diagnosis not present

## 2022-06-19 DIAGNOSIS — E782 Mixed hyperlipidemia: Secondary | ICD-10-CM

## 2022-06-19 DIAGNOSIS — E669 Obesity, unspecified: Secondary | ICD-10-CM

## 2022-06-19 DIAGNOSIS — Z6841 Body Mass Index (BMI) 40.0 and over, adult: Secondary | ICD-10-CM

## 2022-06-20 LAB — LIPID PANEL WITH LDL/HDL RATIO
Cholesterol, Total: 162 mg/dL (ref 100–199)
HDL: 40 mg/dL (ref >39)
LDL Chol Calc (NIH): 93 mg/dL (ref 0–99)
LDL/HDL Ratio: 2.3 ratio (ref 0.0–3.2)
Triglycerides: 168 mg/dL — ABNORMAL HIGH (ref 0–149)
VLDL Cholesterol Cal: 29 mg/dL (ref 5–40)

## 2022-06-20 LAB — HEMOGLOBIN A1C
Est. average glucose Bld gHb Est-mCnc: 103 mg/dL
Hgb A1c MFr Bld: 5.2 % (ref 4.8–5.6)

## 2022-06-20 LAB — CMP14+EGFR
ALT: 11 IU/L (ref 0–32)
AST: 12 IU/L (ref 0–40)
Albumin/Globulin Ratio: 1.5 (ref 1.2–2.2)
Albumin: 4 g/dL (ref 3.9–4.9)
Alkaline Phosphatase: 84 IU/L (ref 44–121)
BUN/Creatinine Ratio: 14 (ref 9–23)
BUN: 15 mg/dL (ref 6–24)
Bilirubin Total: 0.3 mg/dL (ref 0.0–1.2)
CO2: 23 mmol/L (ref 20–29)
Calcium: 8.9 mg/dL (ref 8.7–10.2)
Chloride: 103 mmol/L (ref 96–106)
Creatinine, Ser: 1.07 mg/dL — ABNORMAL HIGH (ref 0.57–1.00)
Globulin, Total: 2.6 g/dL (ref 1.5–4.5)
Glucose: 69 mg/dL — ABNORMAL LOW (ref 70–99)
Potassium: 5.2 mmol/L (ref 3.5–5.2)
Sodium: 139 mmol/L (ref 134–144)
Total Protein: 6.6 g/dL (ref 6.0–8.5)
eGFR: 64 mL/min/{1.73_m2} (ref >59)

## 2022-06-20 LAB — INSULIN, RANDOM: INSULIN: 12 u[IU]/mL (ref 2.6–24.9)

## 2022-06-20 LAB — VITAMIN B12: Vitamin B-12: 327 pg/mL (ref 232–1245)

## 2022-06-20 LAB — VITAMIN D 25 HYDROXY (VIT D DEFICIENCY, FRACTURES): Vit D, 25-Hydroxy: 53.6 ng/mL (ref 30.0–100.0)

## 2022-06-25 NOTE — Progress Notes (Unsigned)
Chief Complaint:   OBESITY Sarah Fields is here to discuss her progress with her obesity treatment plan along with follow-up of her obesity related diagnoses. Sarah Fields is on the Category 3 Plan and keeping a food journal and adhering to recommended goals of 1300-1500 calories and 90+ grams of protein daily and states she is following her eating plan approximately 70% of the time. Sarah Fields states she is doing 0 minutes 0 times per week.  Today's visit was #: 50 Starting weight: 295 lbs Starting date: 12/13/2020 Today's weight: 257 lbs Today's date: 06/19/2022 Total lbs lost to date: 38 Total lbs lost since last in-office visit: 0  Interim History: Sarah Fields has been moving her daughter and she has been off her normal routine.  She has been mindful of her food choices and more active than normal, especially walking up and down stairs.  No side effects were noted from [facility].  Subjective:   1. Mixed hyperlipidemia Sarah Fields is working on her diet, exercise, and weight loss.  She is due to have labs.  2. Vitamin D deficiency Sarah Fields is stable on vitamin D, with no side effects noted.  3. B12 deficiency Sarah Fields's last B12 level was below ideal.  She is working on increasing B12 in her diet.  4. Pre-diabetes Sarah Fields is working on her diet and exercise, and she is due for labs.  She denies GI upset with GLP-1.  Assessment/Plan:   1. Mixed hyperlipidemia We will check labs today. Sarah Fields will continue to work on diet, exercise and weight loss efforts. Orders and follow up as documented in patient record.   - Lipid Panel With LDL/HDL Ratio  2. Vitamin D deficiency We will check labs today.  Sarah Fields will follow-up for routine testing of Vitamin D, at least 2-3 times per year to avoid over-replacement.  - VITAMIN D 25 Hydroxy (Vit-D Deficiency, Fractures)  3. B12 deficiency The diagnosis was reviewed with the patient.  We will check labs today, and we will follow-up at Sarah Fields's  next visit. Orders and follow up as documented in patient record.  - Vitamin B12  4. Pre-diabetes We will check labs today. Sarah Fields will continue to work on weight loss, exercise, and decreasing simple carbohydrates to help decrease the risk of diabetes.   - CMP14+EGFR - Hemoglobin A1c - Insulin, random  5. Obesity, Current BMI 44.2 Sarah Fields is currently in the action stage of change. As such, her goal is to continue with weight loss efforts. She has agreed to the Category 3 Plan and keeping a food journal and adhering to recommended goals of 1300-1500 calories and 85+ grams of protein daily.   Sarah Fields will continue Sarah Fields and we will follow-up in 3 weeks.  Behavioral modification strategies: increasing lean protein intake and increasing water intake.  Sarah Fields has agreed to follow-up with our clinic in 3 weeks. She was informed of the importance of frequent follow-up visits to maximize her success with intensive lifestyle modifications for her multiple health conditions.   Sarah Fields was informed we would discuss her lab results at her next visit unless there is a critical issue that needs to be addressed sooner. Sarah Fields agreed to keep her next visit at the agreed upon time to discuss these results.  Objective:   Blood pressure 134/80, pulse 85, temperature 98 F (36.7 C), height _0  (1.626 m), weight 257 lb (116.6 kg), SpO2 98 %. Body mass index is 44.11 kg/m.  General: Cooperative, alert, well developed, in no acute distress. HEENT: Conjunctivae and  lids unremarkable. Cardiovascular: Regular rhythm.  Lungs: Normal work of breathing. Neurologic: No focal deficits.   Lab Results  Component Value Date   CREATININE 1.07 (H) 06/19/2022   BUN 15 06/19/2022   NA 139 06/19/2022   K 5.2 06/19/2022   CL 103 06/19/2022   CO2 23 06/19/2022   Lab Results  Component Value Date   ALT 11 06/19/2022   AST 12 06/19/2022   ALKPHOS 84 06/19/2022   BILITOT 0.3 06/19/2022   Lab  Results  Component Value Date   HGBA1C 5.2 06/19/2022   HGBA1C 5.4 02/11/2022   HGBA1C 5.5 07/31/2021   HGBA1C 5.4 12/13/2020   HGBA1C 5.5 05/30/2015   Lab Results  Component Value Date   INSULIN 12.0 06/19/2022   INSULIN 17.3 02/11/2022   INSULIN 19.6 07/31/2021   INSULIN 21.4 12/13/2020   Lab Results  Component Value Date   TSH 4.200 12/13/2020   Lab Results  Component Value Date   CHOL 162 06/19/2022   HDL 40 06/19/2022   LDLCALC 93 06/19/2022   TRIG 168 (H) 06/19/2022   CHOLHDL 4.5 05/30/2015   Lab Results  Component Value Date   VD25OH 53.6 06/19/2022   VD25OH 64.7 02/11/2022   VD25OH 112.0 (H) 07/31/2021   Lab Results  Component Value Date   WBC 9.9 09/07/2021   HGB 14.2 09/07/2021   HCT 42.8 09/07/2021   MCV 93.4 09/07/2021   PLT 276 09/07/2021   No results found for: "IRON", "TIBC", "FERRITIN"  Attestation Statements:   Reviewed by clinician on day of visit: allergies, medications, problem list, medical history, surgical history, family history, social history, and previous encounter notes.   I, Trixie Dredge, am acting as transcriptionist for Dennard Nip, MD.  I have reviewed the above documentation for accuracy and completeness, and I agree with the above. -  Dennard Nip, MD

## 2022-07-02 ENCOUNTER — Encounter (INDEPENDENT_AMBULATORY_CARE_PROVIDER_SITE_OTHER): Payer: Self-pay

## 2022-07-09 ENCOUNTER — Other Ambulatory Visit (INDEPENDENT_AMBULATORY_CARE_PROVIDER_SITE_OTHER): Payer: Self-pay | Admitting: Family Medicine

## 2022-07-09 DIAGNOSIS — R7303 Prediabetes: Secondary | ICD-10-CM

## 2022-07-10 ENCOUNTER — Encounter (INDEPENDENT_AMBULATORY_CARE_PROVIDER_SITE_OTHER): Payer: Self-pay | Admitting: Family Medicine

## 2022-07-10 ENCOUNTER — Ambulatory Visit (INDEPENDENT_AMBULATORY_CARE_PROVIDER_SITE_OTHER): Payer: 59 | Admitting: Family Medicine

## 2022-07-10 VITALS — BP 123/81 | HR 80 | Temp 98.1°F | Ht 64.0 in | Wt 254.0 lb

## 2022-07-10 DIAGNOSIS — Z6841 Body Mass Index (BMI) 40.0 and over, adult: Secondary | ICD-10-CM

## 2022-07-10 DIAGNOSIS — K5909 Other constipation: Secondary | ICD-10-CM | POA: Diagnosis not present

## 2022-07-10 DIAGNOSIS — E8881 Metabolic syndrome: Secondary | ICD-10-CM | POA: Diagnosis not present

## 2022-07-10 DIAGNOSIS — E559 Vitamin D deficiency, unspecified: Secondary | ICD-10-CM

## 2022-07-10 DIAGNOSIS — Z7984 Long term (current) use of oral hypoglycemic drugs: Secondary | ICD-10-CM

## 2022-07-10 DIAGNOSIS — E669 Obesity, unspecified: Secondary | ICD-10-CM

## 2022-07-10 DIAGNOSIS — F3289 Other specified depressive episodes: Secondary | ICD-10-CM | POA: Diagnosis not present

## 2022-07-10 DIAGNOSIS — E88819 Insulin resistance, unspecified: Secondary | ICD-10-CM

## 2022-07-10 DIAGNOSIS — R7303 Prediabetes: Secondary | ICD-10-CM

## 2022-07-10 MED ORDER — BUPROPION HCL ER (SR) 200 MG PO TB12: 200.0000 mg | ORAL_TABLET | Freq: Every day | ORAL | 0 refills | Status: DC

## 2022-07-10 MED ORDER — POLYETHYLENE GLYCOL 3350 17 G PO PACK: 17.0000 g | PACK | Freq: Every day | ORAL | 0 refills | Status: DC

## 2022-07-10 MED ORDER — METFORMIN HCL 500 MG PO TABS: 500.0000 mg | ORAL_TABLET | Freq: Two times a day (BID) | ORAL | 0 refills | Status: DC

## 2022-07-20 NOTE — Progress Notes (Unsigned)
Chief Complaint:   OBESITY Sarah Fields is here to discuss her progress with her obesity treatment plan along with follow-up of her obesity related diagnoses. Sarah Fields is on the Category 3 Plan or keeping a food journal and adhering to recommended goals of 1300-1500 calories and 85+ grams of protein daily and states she is following her eating plan approximately 30% of the time. Sarah Fields states she is walking for 15 minutes 2 times per week.  Today's visit was #: 31 Starting weight: 295 lbs Starting date: 12/13/2020 Today's weight: 254 lbs Today's date: 07/10/2022 Total lbs lost to date: 41 Total lbs lost since last in-office visit: 3  Interim History: Sarah Fields has done well with weight loss.  She has had a lot of stress and she has done some stress eating.  She has had some financial issues, making meal planning difficult.  Subjective:   1. Insulin resistance Sarah Fields is working on her diet, and her A1c, glucose, and fasting insulin have all improved.  I discussed labs with the patient today.  2. Vitamin D deficiency Sarah Fields's last vitamin D level was at goal, with no side effects noted.  I discussed labs with the patient today.  3. Other constipation Sarah Fields is stable on MiraLAX, and she takes it approximately every other day.  She feels it is helping with her symptoms.  4. Other depression, emotional eating behaviors Sarah Fields has had a lot of stress at home and she has done some stress eating, but she is working on doing better.  Assessment/Plan:   1. Insulin resistance We will refill metformin for 1 month. Sarah Fields will continue to work on weight loss, exercise, and decreasing simple carbohydrates to help decrease the risk of diabetes. Sarah Fields agreed to follow-up with [place] as directed to closely monitor her progress.  - metFORMIN (GLUCOPHAGE) 500 MG tablet; Take 1 tablet (500 mg total) by mouth 2 (two) times daily with a meal. TAKE 1 TABLET BY MOUTH EVERY DAY WITH BREAKFAST   Dispense: 60 tablet; Refill: 0  2. Vitamin D deficiency Sarah Fields will continue her vitamin D as is, and we will continue to follow.  3. Other constipation Sarah Fields will continue MiraLAX 17 g, and we will refill for 1 month.  - polyethylene glycol (MIRALAX / GLYCOLAX) 17 g packet; Take 17 g by mouth daily. With increased water intake.  Dispense: 30 each; Refill: 0  4. Other depression, emotional eating behaviors Sarah Fields will continue Wellbutrin SR 200 mg once daily with breakfast, and we will refill for 1 month.  - buPROPion (WELLBUTRIN SR) 200 MG 12 hr tablet; Take 1 tablet (200 mg total) by mouth daily with breakfast.  Dispense: 30 tablet; Refill: 0  5. Obesity, Current BMI 43.6 Sarah Fields is currently in the action stage of change. As such, her goal is to continue with weight loss efforts. She has agreed to the Category 3 Plan.   Ways to find less expensive protein options were discussed today.  Exercise goals: As is.   Behavioral modification strategies: increasing lean protein intake and increasing water intake.  Sarah Fields has agreed to follow-up with our clinic in 3 weeks. She was informed of the importance of frequent follow-up visits to maximize her success with intensive lifestyle modifications for her multiple health conditions.   Objective:   Blood pressure 123/81, pulse 80, temperature 98.1 F (36.7 C), height 5\' 4"  (1.626 m), weight 254 lb (115.2 kg), SpO2 98 %. Body mass index is 43.6 kg/m.  General: Cooperative, alert, well developed,  in no acute distress. HEENT: Conjunctivae and lids unremarkable. Cardiovascular: Regular rhythm.  Lungs: Normal work of breathing. Neurologic: No focal deficits.   Lab Results  Component Value Date   CREATININE 1.07 (H) 06/19/2022   BUN 15 06/19/2022   NA 139 06/19/2022   K 5.2 06/19/2022   CL 103 06/19/2022   CO2 23 06/19/2022   Lab Results  Component Value Date   ALT 11 06/19/2022   AST 12 06/19/2022   ALKPHOS 84  06/19/2022   BILITOT 0.3 06/19/2022   Lab Results  Component Value Date   HGBA1C 5.2 06/19/2022   HGBA1C 5.4 02/11/2022   HGBA1C 5.5 07/31/2021   HGBA1C 5.4 12/13/2020   HGBA1C 5.5 05/30/2015   Lab Results  Component Value Date   INSULIN 12.0 06/19/2022   INSULIN 17.3 02/11/2022   INSULIN 19.6 07/31/2021   INSULIN 21.4 12/13/2020   Lab Results  Component Value Date   TSH 4.200 12/13/2020   Lab Results  Component Value Date   CHOL 162 06/19/2022   HDL 40 06/19/2022   LDLCALC 93 06/19/2022   TRIG 168 (H) 06/19/2022   CHOLHDL 4.5 05/30/2015   Lab Results  Component Value Date   VD25OH 53.6 06/19/2022   VD25OH 64.7 02/11/2022   VD25OH 112.0 (H) 07/31/2021   Lab Results  Component Value Date   WBC 9.9 09/07/2021   HGB 14.2 09/07/2021   HCT 42.8 09/07/2021   MCV 93.4 09/07/2021   PLT 276 09/07/2021   No results found for: "IRON", "TIBC", "FERRITIN"  Attestation Statements:   Reviewed by clinician on day of visit: allergies, medications, problem list, medical history, surgical history, family history, social history, and previous encounter notes.  Time spent on visit including pre-visit chart review and post-visit care and charting was 40 minutes.   I, Burt Knack, am acting as transcriptionist for Quillian Quince, MD.  I have reviewed the above documentation for accuracy and completeness, and I agree with the above. -  Quillian Quince, MD

## 2022-07-27 ENCOUNTER — Other Ambulatory Visit (INDEPENDENT_AMBULATORY_CARE_PROVIDER_SITE_OTHER): Payer: Self-pay | Admitting: Family Medicine

## 2022-07-27 DIAGNOSIS — E8881 Metabolic syndrome: Secondary | ICD-10-CM

## 2022-07-31 ENCOUNTER — Encounter (INDEPENDENT_AMBULATORY_CARE_PROVIDER_SITE_OTHER): Payer: Self-pay | Admitting: Family Medicine

## 2022-07-31 ENCOUNTER — Ambulatory Visit (INDEPENDENT_AMBULATORY_CARE_PROVIDER_SITE_OTHER): Payer: 59 | Admitting: Family Medicine

## 2022-07-31 VITALS — BP 128/84 | HR 82 | Temp 98.0°F | Ht 64.0 in | Wt 253.0 lb

## 2022-07-31 DIAGNOSIS — F439 Reaction to severe stress, unspecified: Secondary | ICD-10-CM

## 2022-07-31 DIAGNOSIS — Z6841 Body Mass Index (BMI) 40.0 and over, adult: Secondary | ICD-10-CM

## 2022-07-31 DIAGNOSIS — E669 Obesity, unspecified: Secondary | ICD-10-CM | POA: Diagnosis not present

## 2022-08-06 NOTE — Progress Notes (Signed)
Chief Complaint:   OBESITY Sarah Fields is here to discuss her progress with her obesity treatment plan along with follow-up of her obesity related diagnoses. Sarah Fields is on the Category 3 Plan and states she is following her eating plan approximately 70% of the time. Sarah Fields states she is walking and biking for 15 minutes 2 times per week.  Today's visit was #: 32 Starting weight: 295 lbs Starting date: 12/13/2020 Today's weight: 253 lbs Today's date: 07/31/2022 Total lbs lost to date: 42 Total lbs lost since last in-office visit: 1  Interim History: Sarah Fields continues to do well with her weight loss.  She had some extra challenges this last few weeks, but she has done well with portion control and making smarter choices.  She feels it will be easier for her to follow her plan this month.  Subjective:   1. Stress Sarah Fields notes increased work stress and some financial stress, which has made following her eating plan more difficult.  She did very well with navigating this stress and she is doing well on her medications.  Assessment/Plan:   1. Stress Sarah Fields will continue Wellbutrin, and we will refill as needed.  We will continue to follow.  2. Obesity, Current BMI 43.5 Sarah Fields is currently in the action stage of change. As such, her goal is to continue with weight loss efforts. She has agreed to the Category 3 Plan.   Exercise goals: As is.   Behavioral modification strategies: meal planning and cooking strategies, emotional eating strategies, and dealing with family or coworker sabotage.  Sarah Fields has agreed to follow-up with our clinic in 3 weeks. She was informed of the importance of frequent follow-up visits to maximize her success with intensive lifestyle modifications for her multiple health conditions.   Objective:   Blood pressure 128/84, pulse 82, temperature 98 F (36.7 C), height 5\' 4"  (1.626 m), weight 253 lb (114.8 kg), SpO2 98 %. Body mass index is 43.43  kg/m.  General: Cooperative, alert, well developed, in no acute distress. HEENT: Conjunctivae and lids unremarkable. Cardiovascular: Regular rhythm.  Lungs: Normal work of breathing. Neurologic: No focal deficits.   Lab Results  Component Value Date   CREATININE 1.07 (H) 06/19/2022   BUN 15 06/19/2022   NA 139 06/19/2022   K 5.2 06/19/2022   CL 103 06/19/2022   CO2 23 06/19/2022   Lab Results  Component Value Date   ALT 11 06/19/2022   AST 12 06/19/2022   ALKPHOS 84 06/19/2022   BILITOT 0.3 06/19/2022   Lab Results  Component Value Date   HGBA1C 5.2 06/19/2022   HGBA1C 5.4 02/11/2022   HGBA1C 5.5 07/31/2021   HGBA1C 5.4 12/13/2020   HGBA1C 5.5 05/30/2015   Lab Results  Component Value Date   INSULIN 12.0 06/19/2022   INSULIN 17.3 02/11/2022   INSULIN 19.6 07/31/2021   INSULIN 21.4 12/13/2020   Lab Results  Component Value Date   TSH 4.200 12/13/2020   Lab Results  Component Value Date   CHOL 162 06/19/2022   HDL 40 06/19/2022   LDLCALC 93 06/19/2022   TRIG 168 (H) 06/19/2022   CHOLHDL 4.5 05/30/2015   Lab Results  Component Value Date   VD25OH 53.6 06/19/2022   VD25OH 64.7 02/11/2022   VD25OH 112.0 (H) 07/31/2021   Lab Results  Component Value Date   WBC 9.9 09/07/2021   HGB 14.2 09/07/2021   HCT 42.8 09/07/2021   MCV 93.4 09/07/2021   PLT 276 09/07/2021  No results found for: "IRON", "TIBC", "FERRITIN"  Attestation Statements:   Reviewed by clinician on day of visit: allergies, medications, problem list, medical history, surgical history, family history, social history, and previous encounter notes.  Time spent on visit including pre-visit chart review and post-visit care and charting was 30 minutes.   I, Trixie Dredge, am acting as transcriptionist for Dennard Nip, MD.  I have reviewed the above documentation for accuracy and completeness, and I agree with the above. -  Dennard Nip, MD

## 2022-08-21 ENCOUNTER — Encounter (INDEPENDENT_AMBULATORY_CARE_PROVIDER_SITE_OTHER): Payer: Self-pay | Admitting: Family Medicine

## 2022-08-21 ENCOUNTER — Ambulatory Visit (INDEPENDENT_AMBULATORY_CARE_PROVIDER_SITE_OTHER): Payer: 59 | Admitting: Family Medicine

## 2022-08-21 VITALS — BP 112/78 | HR 86 | Temp 98.1°F | Ht 64.0 in | Wt 257.0 lb

## 2022-08-21 DIAGNOSIS — E8881 Metabolic syndrome: Secondary | ICD-10-CM

## 2022-08-21 DIAGNOSIS — E669 Obesity, unspecified: Secondary | ICD-10-CM | POA: Diagnosis not present

## 2022-08-21 DIAGNOSIS — F3289 Other specified depressive episodes: Secondary | ICD-10-CM

## 2022-08-21 DIAGNOSIS — Z6841 Body Mass Index (BMI) 40.0 and over, adult: Secondary | ICD-10-CM

## 2022-08-21 MED ORDER — BUPROPION HCL ER (SR) 150 MG PO TB12: 150.0000 mg | ORAL_TABLET | Freq: Two times a day (BID) | ORAL | 0 refills | Status: DC

## 2022-08-21 MED ORDER — METFORMIN HCL 500 MG PO TABS: 500.0000 mg | ORAL_TABLET | Freq: Two times a day (BID) | ORAL | 0 refills | Status: DC

## 2022-08-24 NOTE — Progress Notes (Unsigned)
Complete physical exam   Patient: Sarah Fields   DOB: [birth year]   46 y.o. Female  MRN: [id number] Visit Date: 08/25/2022    No chief complaint on file.  Subjective    Sarah Fields is a 46 y.o. female who presents today for a complete physical exam.  She reports consuming a {diet types:17450} diet. {Exercise:19826} She generally feels {well/fairly well/poorly:18703}. She {does/does not:200015} have additional problems to discuss today.   HPI  Annual physical  -migraines.- generally around the time of her menstrual period.  -followed routinely [facility].  -most recent labs done 05/2022 showed mild elevation of triglycerides  -due for colon cancer screening.   Past Medical History:  Diagnosis Date   Allergy    Anxiety    Asthma    Broken foot    Depression    Essential tremor    Gestational diabetes    h/o   History of COVID-19 04/24/2021   Joint pain    Migraines    with aura   Other fatigue    Shortness of breath    Shortness of breath on exertion    Tenosynovitis, de Quervain    Vitamin D deficiency    Past Surgical History:  Procedure Laterality Date   CESAREAN SECTION     2001, 2003   KNEE SURGERY Bilateral    arthroscopic   TUBAL LIGATION     WISDOM TOOTH EXTRACTION     Social History   Socioeconomic History   Marital status: Divorced    Spouse name: Not on file   Number of children: 2   Years of education: Not on file   Highest education level: Master's degree (e.g., MA, MS, MEng, MEd, MSW, MBA)  Occupational History   Occupation: infant toddler Artist [employer]  Tobacco Use   Smoking status: Never   Smokeless tobacco: Never  Vaping Use   Vaping Use: Never used  Substance and Sexual Activity   Alcohol use: Never    Alcohol/week: 0.0 standard drinks of alcohol   Drug use: Never   Sexual activity: Not Currently    Partners: Male    Birth control/protection: Surgical     Comment: Tubal  Other Topics Concern   Not on file  Social History Narrative   Lives at home with her children   Right handed   Caffeine: 1 glass of tea per day?   Social Determinants of Health   Financial Resource Strain: Not on file  Food Insecurity: Not on file  Transportation Needs: Not on file  Physical Activity: Not on file  Stress: Not on file  Social Connections: Not on file  Intimate Partner Violence: Not on file   Family Status  Relation Name Status   Mother  Alive   Father  Alive   Brother  Alive   MGM  Deceased   MGF  Deceased   PGM  Deceased   PGF  Deceased   Neg Hx  (Not Specified)   Family History  Problem Relation Age of Onset   Hyperlipidemia Mother    Migraines Mother    Thyroid disease Mother    Glaucoma Mother    Osteoporosis Mother    Anxiety disorder Mother    Seizures Father    Hypertension Father    Cancer Father 64       Prostate Ca   Glaucoma Father    Stroke Maternal Grandmother    Cancer Maternal Grandfather    Migraines  Maternal Grandfather    Stroke Paternal Grandfather    Tremor Neg Hx    Allergies  Allergen Reactions   Banana    Hydrocodone Other (See Comments)   Latex    Other     Pine apples, strawberries, banannas  "one of the 'codones makes me itch" but patient states she has been prescribed them   Penicillins Swelling    Patient only has allergy to the penicillin shot, she can take it orally with no reactions.   Pineapple    Propranolol     Worsening asthma   Strawberry Extract    Adhesive [Tape] Rash   Bactrim [Sulfamethoxazole-Trimethoprim] Rash    Patient Care Team: Ronnell Freshwater, NP as PCP - General (Family Medicine)   Medications: Outpatient Medications Prior to Visit  Medication Sig   albuterol (PROVENTIL HFA;VENTOLIN HFA) 108 (90 BASE) MCG/ACT inhaler Inhale 2 puffs into the lungs every 6 (six) hours as needed for wheezing or shortness of breath.    Azelastine-Fluticasone 137-50 MCG/ACT SUSP Place  1 spray into both nostrils 2 (two) times daily.   buPROPion (WELLBUTRIN SR) 150 MG 12 hr tablet Take 1 tablet (150 mg total) by mouth 2 (two) times daily.   EPINEPHrine 0.3 mg/0.3 mL IJ SOAJ injection INJECT IN OUTER THIGH AS NEEDED FOR ANAPHYLAXIS   fluticasone (FLONASE) 50 MCG/ACT nasal spray Place 2 sprays into both nostrils daily.   levocetirizine (XYZAL) 5 MG tablet SMARTSIG:1 Tablet(s) By Mouth Every Evening   Magnesium 400 MG CAPS Take 1 tablet by mouth daily.   metFORMIN (GLUCOPHAGE) 500 MG tablet Take 1 tablet (500 mg total) by mouth 2 (two) times daily with a meal. TAKE 1 TABLET BY MOUTH EVERY DAY WITH BREAKFAST   montelukast (SINGULAIR) 10 MG tablet Take 10 mg by mouth at bedtime.    norethindrone (INCASSIA) 0.35 MG tablet Take 1 tablet (0.35 mg total) by mouth daily.   polyethylene glycol (MIRALAX / GLYCOLAX) 17 g packet Take 17 g by mouth daily. With increased water intake.   primidone (MYSOLINE) 50 MG tablet TAKE 1 TABLET BY MOUTH EVERYDAY AT BEDTIME   rizatriptan (MAXALT) 10 MG tablet Take 1 tablet (10 mg total) by mouth as needed for migraine. May repeat in 2 hours if needed   sodium chloride (OCEAN) 0.65 % SOLN nasal spray Place 1 spray into both nostrils as needed for congestion.   SYMBICORT 160-4.5 MCG/ACT inhaler    Vitamin D, Ergocalciferol, (DRISDOL) 1.25 MG (50000 UNIT) CAPS capsule TAKE 1 CAPSULE BY MOUTH ONCE EVERY 3 DAYS.   No facility-administered medications prior to visit.    Review of Systems  Last CBC Lab Results  Component Value Date   WBC 9.9 09/07/2021   HGB 14.2 09/07/2021   HCT 42.8 09/07/2021   MCV 93.4 09/07/2021   MCH 31.0 09/07/2021   RDW 12.5 09/07/2021   PLT 276 40/98/1191   Last metabolic panel Lab Results  Component Value Date   GLUCOSE 69 (L) 06/19/2022   NA 139 06/19/2022   K 5.2 06/19/2022   CL 103 06/19/2022   CO2 23 06/19/2022   BUN 15 06/19/2022   CREATININE 1.07 (H) 06/19/2022   EGFR 64 06/19/2022   CALCIUM 8.9 06/19/2022    PROT 6.6 06/19/2022   ALBUMIN 4.0 06/19/2022   LABGLOB 2.6 06/19/2022   AGRATIO 1.5 06/19/2022   BILITOT 0.3 06/19/2022   ALKPHOS 84 06/19/2022   AST 12 06/19/2022   ALT 11 06/19/2022   ANIONGAP 9 09/07/2021  Last lipids Lab Results  Component Value Date   CHOL 162 06/19/2022   HDL 40 06/19/2022   LDLCALC 93 06/19/2022   TRIG 168 (H) 06/19/2022   CHOLHDL 4.5 05/30/2015   Last hemoglobin A1c Lab Results  Component Value Date   HGBA1C 5.2 06/19/2022   Last thyroid functions Lab Results  Component Value Date   TSH 4.200 12/13/2020   T3TOTAL 126 12/13/2020   T4TOTAL 6.7 12/13/2020   Last vitamin D Lab Results  Component Value Date   VD25OH 53.6 06/19/2022   Last vitamin B12 and Folate Lab Results  Component Value Date   VITAMINB12 327 06/19/2022   FOLATE 10.9 12/13/2020       Objective    There were no vitals taken for this visit. BP Readings from Last 3 Encounters:  08/21/22 112/78  07/31/22 128/84  07/10/22 123/81    Wt Readings from Last 3 Encounters:  08/21/22 257 lb (116.6 kg)  07/31/22 253 lb (114.8 kg)  07/10/22 254 lb (115.2 kg)     Physical Exam  ***  Last depression screening scores   Row Labels 02/19/2022   10:08 AM 08/21/2021    9:48 AM 07/23/2021    1:31 PM  PHQ 2/9 Scores   Section Header. No data exists in this row.     PHQ - 2 Score   0 0 0  PHQ- 9 Score   0 0 1   Last fall risk screening   Row Labels 08/21/2021    9:49 AM  Fall Risk    Section Header. No data exists in this row.   Falls in the past year?   0  Number falls in past yr:   0  Injury with Fall?   0  Follow up   Falls evaluation completed   Last Audit-C alcohol use screening   No data to display    A score of 3 or more in women, and 4 or more in men indicates increased risk for alcohol abuse, EXCEPT if all of the points are from question 1   No results found for any visits on 08/25/22.  Assessment & Plan    Routine Health Maintenance and Physical  Exam  Exercise Activities and Dietary recommendations  Goals   None     Immunization History  Administered Date(s) Administered   Influenza, High Dose Seasonal PF 10/22/2021   Influenza,inj,Quad PF,6+ Mos 08/21/2021   Influenza,inj,quad, With Preservative 08/04/2017   Moderna Sars-Covid-2 Vaccination 03/18/2020, 04/15/2020   Tdap 08/21/2021    Health Maintenance  Topic Date Due   COLONOSCOPY (Pts 45-19yr Insurance coverage will need to be confirmed)  Never done   COVID-19 Vaccine (3 - Moderna series) 06/10/2020   INFLUENZA VACCINE  06/24/2022   Hepatitis C Screening  09/01/2022 (Originally 06/15/1994)   PAP SMEAR-Modifier  09/23/2024   TETANUS/TDAP  08/22/2031   HIV Screening  Completed   HPV VACCINES  Aged Out    Discussed health benefits of physical activity, and encouraged her to engage in regular exercise appropriate for her age and condition.  Problem List Items Addressed This Visit   None    No follow-ups on file.        HRonnell Freshwater NP  [phone](phone) [phone](fax)  CSegundo

## 2022-08-25 ENCOUNTER — Encounter: Payer: Self-pay | Admitting: Nurse Practitioner

## 2022-08-25 ENCOUNTER — Ambulatory Visit (INDEPENDENT_AMBULATORY_CARE_PROVIDER_SITE_OTHER): Payer: 59 | Admitting: Nurse Practitioner

## 2022-08-25 VITALS — BP 124/84 | HR 78 | Ht 64.0 in | Wt 261.8 lb

## 2022-08-25 DIAGNOSIS — G250 Essential tremor: Secondary | ICD-10-CM

## 2022-08-25 DIAGNOSIS — Z0001 Encounter for general adult medical examination with abnormal findings: Secondary | ICD-10-CM | POA: Diagnosis not present

## 2022-08-25 DIAGNOSIS — Z6841 Body Mass Index (BMI) 40.0 and over, adult: Secondary | ICD-10-CM | POA: Diagnosis not present

## 2022-08-25 DIAGNOSIS — Z1211 Encounter for screening for malignant neoplasm of colon: Secondary | ICD-10-CM

## 2022-08-25 DIAGNOSIS — Z83719 Family history of colon polyps, unspecified: Secondary | ICD-10-CM | POA: Insufficient documentation

## 2022-08-25 DIAGNOSIS — G43909 Migraine, unspecified, not intractable, without status migrainosus: Secondary | ICD-10-CM | POA: Insufficient documentation

## 2022-08-25 DIAGNOSIS — E782 Mixed hyperlipidemia: Secondary | ICD-10-CM

## 2022-08-25 MED ORDER — NURTEC 75 MG PO TBDP: ORAL_TABLET | ORAL | 0 refills | Status: DC

## 2022-08-25 NOTE — Progress Notes (Signed)
Chief Complaint:   OBESITY Sarah Fields is here to discuss her progress with her obesity treatment plan along with follow-up of her obesity related diagnoses. Sarah Fields is on the Category 3 Plan and states she is following her eating plan approximately 75% of the time. Sarah Fields states she is doing 0 minutes 0 times per week.  Today's visit was #: 33 Starting weight: 295 lbs Starting date: 12/13/2020 Today's weight: 257 lbs Today's date: 08/21/2022 Total lbs lost to date: 38 Total lbs lost since last in-office visit: 0  Interim History: Sarah Fields is struggling with meal planning due to recent family and financial stress.   Subjective:   1. Insulin resistance Sarah Fields is struggling with her diet and weight loss.   2. Other depression, emotional eating behaviors Sarah Fields still notes stress has increased recently.   Assessment/Plan:   1. Insulin resistance We will refill metformin for 1 month. Sarah Fields will continue to work on weight loss, exercise, and decreasing simple carbohydrates to help decrease the risk of diabetes. Sarah Fields agreed to follow-up with [place] as directed to closely monitor her progress.  - metFORMIN (GLUCOPHAGE) 500 MG tablet; Take 1 tablet (500 mg total) by mouth 2 (two) times daily with a meal. TAKE 1 TABLET BY MOUTH EVERY DAY WITH BREAKFAST  Dispense: 60 tablet; Refill: 0  2. Other depression, emotional eating behaviors Sarah Fields agreed to change Wellbutrin SR to 150 mg BID with no refills.   - buPROPion (WELLBUTRIN SR) 150 MG 12 hr tablet; Take 1 tablet (150 mg total) by mouth 2 (two) times daily.  Dispense: 60 tablet; Refill: 0  3. Obesity, Current BMI 44.2 Sarah Fields is currently in the action stage of change. As such, her goal is to continue with weight loss efforts. She has agreed to the Category 3 Plan.   Sarah Fields was encouraged to use food pantry resources while she works on her financial struggles.   Behavioral modification strategies: meal planning and  cooking strategies.  Sarah Fields has agreed to follow-up with our clinic in 3 weeks. She was informed of the importance of frequent follow-up visits to maximize her success with intensive lifestyle modifications for her multiple health conditions.   Objective:   Blood pressure 112/78, pulse 86, temperature 98.1 F (36.7 C), height 5\' 4"  (1.626 m), weight 257 lb (116.6 kg), SpO2 98 %. Body mass index is 44.11 kg/m.  General: Cooperative, alert, well developed, in no acute distress. HEENT: Conjunctivae and lids unremarkable. Cardiovascular: Regular rhythm.  Lungs: Normal work of breathing. Neurologic: No focal deficits.   Lab Results  Component Value Date   CREATININE 1.07 (H) 06/19/2022   BUN 15 06/19/2022   NA 139 06/19/2022   K 5.2 06/19/2022   CL 103 06/19/2022   CO2 23 06/19/2022   Lab Results  Component Value Date   ALT 11 06/19/2022   AST 12 06/19/2022   ALKPHOS 84 06/19/2022   BILITOT 0.3 06/19/2022   Lab Results  Component Value Date   HGBA1C 5.2 06/19/2022   HGBA1C 5.4 02/11/2022   HGBA1C 5.5 07/31/2021   HGBA1C 5.4 12/13/2020   HGBA1C 5.5 05/30/2015   Lab Results  Component Value Date   INSULIN 12.0 06/19/2022   INSULIN 17.3 02/11/2022   INSULIN 19.6 07/31/2021   INSULIN 21.4 12/13/2020   Lab Results  Component Value Date   TSH 4.200 12/13/2020   Lab Results  Component Value Date   CHOL 162 06/19/2022   HDL 40 06/19/2022   LDLCALC 93 06/19/2022  TRIG 168 (H) 06/19/2022   CHOLHDL 4.5 05/30/2015   Lab Results  Component Value Date   VD25OH 53.6 06/19/2022   VD25OH 64.7 02/11/2022   VD25OH 112.0 (H) 07/31/2021   Lab Results  Component Value Date   WBC 9.9 09/07/2021   HGB 14.2 09/07/2021   HCT 42.8 09/07/2021   MCV 93.4 09/07/2021   PLT 276 09/07/2021   No results found for: "IRON", "TIBC", "FERRITIN"  Attestation Statements:   Reviewed by clinician on day of visit: allergies, medications, problem list, medical history, surgical  history, family history, social history, and previous encounter notes.   I, Trixie Dredge, am acting as transcriptionist for Dennard Nip, MD.  I have reviewed the above documentation for accuracy and completeness, and I agree with the above. -  Dennard Nip, MD

## 2022-09-02 ENCOUNTER — Encounter: Payer: Self-pay | Admitting: Nurse Practitioner

## 2022-09-02 DIAGNOSIS — Z1211 Encounter for screening for malignant neoplasm of colon: Secondary | ICD-10-CM

## 2022-09-03 ENCOUNTER — Other Ambulatory Visit: Payer: Self-pay | Admitting: Obstetrics and Gynecology

## 2022-09-04 ENCOUNTER — Other Ambulatory Visit: Payer: Self-pay | Admitting: Nurse Practitioner

## 2022-09-04 DIAGNOSIS — G43909 Migraine, unspecified, not intractable, without status migrainosus: Secondary | ICD-10-CM

## 2022-09-04 MED ORDER — NURTEC 75 MG PO TBDP: ORAL_TABLET | ORAL | 2 refills | Status: DC

## 2022-09-04 NOTE — Telephone Encounter (Signed)
Last AEX 09/23/2021--scheduled for 10/15/2022 Last mammo 11/19/2021-neg birads 1

## 2022-09-11 ENCOUNTER — Ambulatory Visit (INDEPENDENT_AMBULATORY_CARE_PROVIDER_SITE_OTHER): Payer: 59 | Admitting: Family Medicine

## 2022-09-11 ENCOUNTER — Encounter (INDEPENDENT_AMBULATORY_CARE_PROVIDER_SITE_OTHER): Payer: Self-pay | Admitting: Family Medicine

## 2022-09-11 VITALS — BP 124/79 | HR 87 | Temp 98.3°F | Ht 64.0 in | Wt 256.0 lb

## 2022-09-11 DIAGNOSIS — E559 Vitamin D deficiency, unspecified: Secondary | ICD-10-CM

## 2022-09-11 DIAGNOSIS — R7303 Prediabetes: Secondary | ICD-10-CM | POA: Diagnosis not present

## 2022-09-11 DIAGNOSIS — R519 Headache, unspecified: Secondary | ICD-10-CM | POA: Diagnosis not present

## 2022-09-11 DIAGNOSIS — E669 Obesity, unspecified: Secondary | ICD-10-CM | POA: Diagnosis not present

## 2022-09-11 DIAGNOSIS — Z6841 Body Mass Index (BMI) 40.0 and over, adult: Secondary | ICD-10-CM

## 2022-09-11 DIAGNOSIS — F3289 Other specified depressive episodes: Secondary | ICD-10-CM

## 2022-09-11 MED ORDER — METFORMIN HCL 500 MG PO TABS: 500.0000 mg | ORAL_TABLET | Freq: Two times a day (BID) | ORAL | 0 refills | Status: DC

## 2022-09-11 MED ORDER — VITAMIN D (ERGOCALCIFEROL) 1.25 MG (50000 UNIT) PO CAPS: ORAL_CAPSULE | ORAL | 0 refills | Status: DC

## 2022-09-11 MED ORDER — BUPROPION HCL ER (SR) 150 MG PO TB12: 150.0000 mg | ORAL_TABLET | Freq: Two times a day (BID) | ORAL | 0 refills | Status: DC

## 2022-09-11 NOTE — Progress Notes (Signed)
Office: [phone]  /  Fax: [phone]   STotal lbs lost to date: 47 Total lbs lost since last in-office visit: 1     BP 124/79   Pulse 87   Temp 98.3 F (36.8 C)   Ht 5\' 4"  (1.626 m)   Wt 256 lb (116.1 kg)   LMP 08/22/2022   SpO2 98%   BMI 43.94 kg/m  She was weighed on the bioimpedance scale:  Body mass index is 43.94 kg/m.  General:  Alert, oriented and cooperative. Patient is in no acute distress.  Mental Status: Normal mood and affect. Normal behavior. Normal judgment and thought content.        Patient past medical history includes:   Past Medical History:  Diagnosis Date   Allergy    Anxiety    Asthma    Broken foot    Depression    Essential tremor    Gestational diabetes    h/o   History of COVID-19 04/24/2021   Joint pain    Migraines    with aura   Other fatigue    Shortness of breath    Shortness of breath on exertion    Tenosynovitis, de Quervain    Vitamin D deficiency    History of Present Illness The patient, Sarah Fields, presents with a history of obesity and vitamin D deficiency. They report that their hunger has been manageable overall, and their cravings have improved. However, they have recently experienced cravings for sweet foods, which they are unsure of the cause. The patient has also been experiencing more migraine headaches lately, which they believe may be related to hormonal changes. They describe a desire for chocolate and other sweet foods when experiencing these headaches.  The patient has been taking metformin for prediabetes and has not reported any gastrointestinal upset. They have had some difficulty remembering to take the second dose of the medication, but they are working on improving this. They are also taking Wellbutrin and have not experienced any issues with sleep or dry mouth. The patient has been prescribed vitamin D supplements, but they have been advised to put this on hold until after their next labs are  completed.  In addition to their current medications, the patient has been working on incorporating breakfast into their daily routine and walking for at least 10 minutes per day. They have lost a pound since their last visit and are making progress in the right direction. The patient has a history of occasional constipation and is approaching the age where a colonoscopy is recommended. They have not yet scheduled the procedure but are considering it for the future.  The patient has been experiencing allergy symptoms, such as scratchiness in their throat, which they attribute to their allergies to leaves and mold. They have tried to increase their water intake to combat any potential dehydration that may be contributing to their headaches. The patient is scheduled for a follow-up appointment to check their labs and assess their vitamin D levels before the end of the year.  Assessment & Plan Prediabetes: Patient is on Metformin but has difficulty remembering the second dose. -Encouraged setting an alarm as a reminder to take the second dose.  Weight Management: Patient lost a pound since the last visit and is managing food cravings better. -Encouraged to continue with current dietary habits and exercise-follow category 3 plan  Migraines: Patient reports an increase in migraines, possibly related to hormonal changes and weather shifts. -Advised to stay hydrated and continue current  management strategies.  Vitamin D Supplementation: Previous lab results showed high Vitamin D levels, currently on hold. -Plan to recheck Vitamin D levels before the end of the year to determine if supplementation needs to be resumed.  Colonoscopy: Patient is due for a colonoscopy. -Advised to follow the prep instructions provided by the gastroenterologist, including possible dietary restrictions and medication adjustments.  Follow-up appointment scheduled for November 30th, fasting labs to be drawn.   Quillian Quince, MD

## 2022-09-23 ENCOUNTER — Ambulatory Visit: Payer: 59 | Admitting: Nurse Practitioner

## 2022-09-23 ENCOUNTER — Encounter: Payer: Self-pay | Admitting: Nurse Practitioner

## 2022-09-23 VITALS — BP 119/87 | HR 96 | Ht 64.0 in | Wt 255.1 lb

## 2022-09-23 DIAGNOSIS — B9689 Other specified bacterial agents as the cause of diseases classified elsewhere: Secondary | ICD-10-CM | POA: Diagnosis not present

## 2022-09-23 DIAGNOSIS — J028 Acute pharyngitis due to other specified organisms: Secondary | ICD-10-CM

## 2022-09-23 DIAGNOSIS — G43909 Migraine, unspecified, not intractable, without status migrainosus: Secondary | ICD-10-CM

## 2022-09-23 MED ORDER — METHYLPREDNISOLONE 4 MG PO TBPK: ORAL_TABLET | ORAL | 0 refills | Status: DC

## 2022-09-23 MED ORDER — NURTEC 75 MG PO TBDP: ORAL_TABLET | ORAL | 3 refills | Status: DC

## 2022-09-23 MED ORDER — AZITHROMYCIN 250 MG PO TABS: ORAL_TABLET | ORAL | 0 refills | Status: DC

## 2022-09-23 NOTE — Progress Notes (Signed)
Established patient visit   Patient: Sarah Fields   DOB: 08/15/1976   46 y.o. Female  MRN: [id number] Visit Date: 09/23/2022  Chief Complaint  Patient presents with   Nasal Congestion   Subjective    Sore Throat  This is a new problem. The current episode started 1 to 4 weeks ago. The problem has been gradually worsening. Neither side of throat is experiencing more pain than the other. There has been no fever. Associated symptoms include congestion, coughing, ear pain, headaches, a hoarse voice, a plugged ear sensation, neck pain, stridor, swollen glands and trouble swallowing. Pertinent negatives include no diarrhea or drooling. She has had no exposure to strep or mono. Treatments tried: has taken dayQuil, NyQuil, and mucinex along with allergy medications. The treatment provided mild relief.      Medications: Outpatient Medications Prior to Visit  Medication Sig   albuterol (PROVENTIL HFA;VENTOLIN HFA) 108 (90 BASE) MCG/ACT inhaler Inhale 2 puffs into the lungs every 6 (six) hours as needed for wheezing or shortness of breath.    Azelastine-Fluticasone 137-50 MCG/ACT SUSP Place 1 spray into both nostrils 2 (two) times daily.   buPROPion (WELLBUTRIN SR) 150 MG 12 hr tablet Take 1 tablet (150 mg total) by mouth 2 (two) times daily.   EPINEPHrine 0.3 mg/0.3 mL IJ SOAJ injection INJECT IN OUTER THIGH AS NEEDED FOR ANAPHYLAXIS   fluticasone (FLONASE) 50 MCG/ACT nasal spray Place 2 sprays into both nostrils daily.   INCASSIA 0.35 MG tablet TAKE 1 TABLET BY MOUTH EVERY DAY   levocetirizine (XYZAL) 5 MG tablet SMARTSIG:1 Tablet(s) By Mouth Every Evening   Magnesium 400 MG CAPS Take 1 tablet by mouth daily.   metFORMIN (GLUCOPHAGE) 500 MG tablet Take 1 tablet (500 mg total) by mouth 2 (two) times daily with a meal. TAKE 1 TABLET BY MOUTH EVERY DAY WITH BREAKFAST   montelukast (SINGULAIR) 10 MG tablet Take 10 mg by mouth at bedtime.    polyethylene glycol (MIRALAX / GLYCOLAX) 17 g  packet Take 17 g by mouth daily. With increased water intake.   primidone (MYSOLINE) 50 MG tablet TAKE 1 TABLET BY MOUTH EVERYDAY AT BEDTIME   rizatriptan (MAXALT) 10 MG tablet Take 1 tablet (10 mg total) by mouth as needed for migraine. May repeat in 2 hours if needed   sodium chloride (OCEAN) 0.65 % SOLN nasal spray Place 1 spray into both nostrils as needed for congestion.   SYMBICORT 160-4.5 MCG/ACT inhaler    [DISCONTINUED] Rimegepant Sulfate (NURTEC) 75 MG TBDP Take 1 tablet by mouth x 1 dose for acute headache. (Max 75 mg/day)   No facility-administered medications prior to visit.    Review of Systems  HENT:  Positive for congestion, ear pain, hoarse voice and trouble swallowing. Negative for drooling.   Respiratory:  Positive for cough and stridor.   Gastrointestinal:  Negative for diarrhea.  Musculoskeletal:  Positive for neck pain.  Neurological:  Positive for headaches.     Objective     Today's Vitals   09/23/22 1051  BP: 119/87  Pulse: 96  SpO2: 99%  Weight: 255 lb 1.9 oz (115.7 kg)  Height: 5\' 4"  (1.626 m)   Body mass index is 43.79 kg/m.   Physical Exam Vitals and nursing note reviewed.  Constitutional:      Appearance: Normal appearance. She is well-developed. She is ill-appearing.  HENT:     Head: Normocephalic and atraumatic.     Right Ear: Tympanic membrane is bulging.  Left Ear: Tympanic membrane is bulging.     Nose: Congestion present.     Right Turbinates: Swollen.     Left Turbinates: Swollen.     Right Sinus: No maxillary sinus tenderness or frontal sinus tenderness.     Left Sinus: No maxillary sinus tenderness or frontal sinus tenderness.     Mouth/Throat:     Pharynx: Pharyngeal swelling and posterior oropharyngeal erythema present.  Eyes:     Pupils: Pupils are equal, round, and reactive to light.  Cardiovascular:     Rate and Rhythm: Normal rate and regular rhythm.     Pulses: Normal pulses.     Heart sounds: Normal heart sounds.   Pulmonary:     Effort: Pulmonary effort is normal.     Breath sounds: Normal breath sounds.  Abdominal:     Palpations: Abdomen is soft.  Musculoskeletal:        General: Normal range of motion.     Cervical back: Normal range of motion and neck supple.  Lymphadenopathy:     Cervical: Cervical adenopathy present.  Skin:    General: Skin is warm and dry.     Capillary Refill: Capillary refill takes less than 2 seconds.  Neurological:     General: No focal deficit present.     Mental Status: She is alert and oriented to person, place, and time.  Psychiatric:        Mood and Affect: Mood normal.        Behavior: Behavior normal.        Thought Content: Thought content normal.        Judgment: Judgment normal.       Assessment & Plan     1. Acute bacterial pharyngitis Start z-pack. Take as directed for 5 days. Add medrol taper. Take as directed for 6 days. Rest and increase fluids. Continue using OTC medication to control symptoms.   - azithromycin (ZITHROMAX) 250 MG tablet; z-pack - take as directed for 5 days  Dispense: 6 tablet; Refill: 0 - methylPREDNISolone (MEDROL) 4 MG TBPK tablet; Take by mouth as directed for 6 days  Dispense: 21 tablet; Refill: 0  2. Acute migraine Resend prescription for nurtec 75 mg as needed for migraine headache to [facility] pharmacy.  - Rimegepant Sulfate (NURTEC) 75 MG TBDP; Take 1 tablet by mouth x 1 dose for acute headache. (Max 75 mg/day)  Dispense: 10 tablet; Refill: 3   Return for prn worsening or persistent symptoms.        Carlean Jews, NP  [facility] [phone] (phone) [phone] (fax)  [facility]

## 2022-09-24 ENCOUNTER — Ambulatory Visit: Payer: 59 | Admitting: Obstetrics and Gynecology

## 2022-10-02 ENCOUNTER — Encounter (INDEPENDENT_AMBULATORY_CARE_PROVIDER_SITE_OTHER): Payer: Self-pay | Admitting: Family Medicine

## 2022-10-02 ENCOUNTER — Ambulatory Visit: Payer: 59 | Admitting: Obstetrics and Gynecology

## 2022-10-02 ENCOUNTER — Ambulatory Visit (INDEPENDENT_AMBULATORY_CARE_PROVIDER_SITE_OTHER): Payer: 59 | Admitting: Family Medicine

## 2022-10-02 VITALS — BP 128/88 | HR 106 | Temp 98.3°F | Ht 64.0 in | Wt 255.4 lb

## 2022-10-02 DIAGNOSIS — E669 Obesity, unspecified: Secondary | ICD-10-CM | POA: Diagnosis not present

## 2022-10-02 DIAGNOSIS — Z6841 Body Mass Index (BMI) 40.0 and over, adult: Secondary | ICD-10-CM | POA: Diagnosis not present

## 2022-10-02 DIAGNOSIS — F3289 Other specified depressive episodes: Secondary | ICD-10-CM

## 2022-10-02 DIAGNOSIS — R7303 Prediabetes: Secondary | ICD-10-CM | POA: Diagnosis not present

## 2022-10-02 MED ORDER — METFORMIN HCL 500 MG PO TABS: 500.0000 mg | ORAL_TABLET | Freq: Two times a day (BID) | ORAL | 0 refills | Status: DC

## 2022-10-02 MED ORDER — BUPROPION HCL ER (SR) 150 MG PO TB12: 150.0000 mg | ORAL_TABLET | Freq: Two times a day (BID) | ORAL | 0 refills | Status: DC

## 2022-10-14 NOTE — Progress Notes (Signed)
GYNECOLOGY  VISIT   HPI: 46 y.o.   Divorced  Caucasian  female   G2P2002 with LMP: 09/06/22.  here for annual exam     Can skip 2 or 3 months of her menstrual cycles.  She is taking progesterone only pills.  Sometimes has sensation of extra heat.   Needs referral for colonoscopy.   Forgets to take her Metformin in the evening.   GYNECOLOGIC HISTORY: LMP: 09/06/22 Contraception: tubal, POPs. Menopausal hormone therapy:  n/a Last mammogram:  11/19/2021 BI-RADS CATEGORY  1: Negative. ACR Breast Density Category b Last pap smear:  07/02/17 Neg:Neg HR HPV, 05-18-14 Neg:Neg HR HPV  Screening testing: Flu vaccine:  completed.  Colonoscopy:  will schedule.         OB History     Gravida  2   Para  2   Term  2   Preterm      AB      Living  2      SAB      IAB      Ectopic      Multiple      Live Births                 Patient Active Problem List   Diagnosis Date Noted   Acute bacterial pharyngitis 09/23/2022   Nonintractable headache 09/11/2022   Acute migraine 08/25/2022   Family history of colonic polyps 08/25/2022   Stress 07/31/2022   Class 3 severe obesity with serious comorbidity and body mass index (BMI) of 50.0 to 59.9 in adult (HCC) 07/31/2022   B12 deficiency 06/19/2022   Mixed hyperlipidemia 06/19/2022   At risk for impaired metabolic function 05/06/2022   Depression 03/25/2022   Pre-diabetes 02/23/2022   Encounter for general adult medical examination with abnormal findings 09/01/2021   Low HDL (under 40) 09/01/2021   BMI 40.0-44.9, adult (HCC) 09/01/2021   Screening for colon cancer 09/01/2021   Need for Tdap vaccination 09/01/2021   Need for influenza vaccination 09/01/2021   Encounter to establish care 08/04/2021   Intractable migraine with aura without status migrainosus 08/04/2021   Insulin resistance 03/21/2021   Vitamin D deficiency 03/21/2021   At risk for malnutrition 03/21/2021   Benign essential tremor 03/13/2020   Asthma  attack 01/19/2020   Asthma 01/19/2020   Acute respiratory failure with hypoxia (HCC)    Hypertriglyceridemia 05/31/2015    Past Medical History:  Diagnosis Date   Allergy    Anxiety    Asthma    Broken foot    Depression    Essential tremor    Gestational diabetes    h/o   History of COVID-19 04/24/2021   Joint pain    Migraines    with aura   Other fatigue    Shortness of breath    Shortness of breath on exertion    Tenosynovitis, de Quervain    Vitamin D deficiency     Past Surgical History:  Procedure Laterality Date   CESAREAN SECTION     2001, 2003   KNEE SURGERY Bilateral    arthroscopic   TUBAL LIGATION     WISDOM TOOTH EXTRACTION      Current Outpatient Medications  Medication Sig Dispense Refill   albuterol (PROVENTIL HFA;VENTOLIN HFA) 108 (90 BASE) MCG/ACT inhaler Inhale 2 puffs into the lungs every 6 (six) hours as needed for wheezing or shortness of breath.      Azelastine-Fluticasone 137-50 MCG/ACT SUSP Place 1 spray into both nostrils  2 (two) times daily.     buPROPion (WELLBUTRIN SR) 150 MG 12 hr tablet Take 1 tablet (150 mg total) by mouth 2 (two) times daily. 60 tablet 0   EPINEPHrine 0.3 mg/0.3 mL IJ SOAJ injection INJECT IN OUTER THIGH AS NEEDED FOR ANAPHYLAXIS     fluticasone (FLONASE) 50 MCG/ACT nasal spray Place 2 sprays into both nostrils daily.     INCASSIA 0.35 MG tablet TAKE 1 TABLET BY MOUTH EVERY DAY 84 tablet 0   levocetirizine (XYZAL) 5 MG tablet SMARTSIG:1 Tablet(s) By Mouth Every Evening     Magnesium 400 MG CAPS Take 1 tablet by mouth daily.     metFORMIN (GLUCOPHAGE) 500 MG tablet Take 1 tablet (500 mg total) by mouth 2 (two) times daily with a meal. TAKE 1 TABLET BY MOUTH EVERY DAY WITH BREAKFAST 60 tablet 0   montelukast (SINGULAIR) 10 MG tablet Take 10 mg by mouth at bedtime.      polyethylene glycol (MIRALAX / GLYCOLAX) 17 g packet Take 17 g by mouth daily. With increased water intake. 30 each 0   primidone (MYSOLINE) 50 MG  tablet TAKE 1 TABLET BY MOUTH EVERYDAY AT BEDTIME 90 tablet 4   Rimegepant Sulfate (NURTEC) 75 MG TBDP Take 1 tablet by mouth x 1 dose for acute headache. (Max 75 mg/day) 10 tablet 3   rizatriptan (MAXALT) 10 MG tablet Take 1 tablet (10 mg total) by mouth as needed for migraine. May repeat in 2 hours if needed 10 tablet 5   sodium chloride (OCEAN) 0.65 % SOLN nasal spray Place 1 spray into both nostrils as needed for congestion.  0   SYMBICORT 160-4.5 MCG/ACT inhaler      Vitamin D, Ergocalciferol, (DRISDOL) 1.25 MG (50000 UNIT) CAPS capsule SMARTSIG:1 Capsule(s) By Mouth Every 3 Days (Patient not taking: Reported on 10/15/2022)     No current facility-administered medications for this visit.     ALLERGIES: Banana, Hydrocodone, Latex, Other, Penicillins, Pineapple, Propranolol, Strawberry extract, Adhesive [tape], and Bactrim [sulfamethoxazole-trimethoprim]  Family History  Problem Relation Age of Onset   Hyperlipidemia Mother    Migraines Mother    Thyroid disease Mother    Glaucoma Mother    Osteoporosis Mother    Anxiety disorder Mother    Seizures Father    Hypertension Father    Cancer Father 21       Prostate Ca   Glaucoma Father    Stroke Maternal Grandmother    Cancer Maternal Grandfather    Migraines Maternal Grandfather    Stroke Paternal Grandfather    Tremor Neg Hx     Social History   Socioeconomic History   Marital status: Divorced    Spouse name: Not on file   Number of children: 2   Years of education: Not on file   Highest education level: Master's degree (e.g., MA, MS, MEng, MEd, MSW, MBA)  Occupational History   Occupation: infant toddler Artist [employer]  Tobacco Use   Smoking status: Never    Passive exposure: Past   Smokeless tobacco: Never  Vaping Use   Vaping Use: Never used  Substance and Sexual Activity   Alcohol use: Never    Alcohol/week: 0.0 standard drinks of alcohol   Drug use: Never   Sexual activity: Not Currently     Partners: Male    Birth control/protection: Surgical    Comment: Tubal  Other Topics Concern   Not on file  Social History Narrative   Lives at home with  her children   Right handed   Caffeine: 1 glass of tea per day?   Social Determinants of Health   Financial Resource Strain: Not on file  Food Insecurity: Not on file  Transportation Needs: Not on file  Physical Activity: Not on file  Stress: Not on file  Social Connections: Not on file  Intimate Partner Violence: Not on file    Review of Systems  All other systems reviewed and are negative.   PHYSICAL EXAMINATION:    BP 124/76 (BP Location: Right Arm, Patient Position: Sitting, Cuff Size: Large)   Ht 5\' 4"  (1.626 m)   Wt 260 lb (117.9 kg)   LMP 09/06/2022 (Exact Date)   BMI 44.63 kg/m     General appearance: alert, cooperative and appears stated age Head: Normocephalic, without obvious abnormality, atraumatic Neck: no adenopathy, supple, symmetrical, trachea midline and thyroid normal to inspection and palpation Lungs: clear to auscultation bilaterally Breasts: normal appearance, no masses or tenderness, No nipple retraction or dimpling, No nipple discharge or bleeding, No axillary or supraclavicular adenopathy Heart: regular rate and rhythm Abdomen: soft, non-tender, no masses,  no organomegaly Extremities: extremities normal, atraumatic, no cyanosis or edema Skin: Skin color, texture, turgor normal. No rashes or lesions Lymph nodes: Cervical, supraclavicular, and axillary nodes normal. No abnormal inguinal nodes palpated Neurologic: Grossly normal  Pelvic: External genitalia:  no lesions              Urethra:  normal appearing urethra with no masses, tenderness or lesions              Bartholins and Skenes: normal                 Vagina: normal appearing vagina with normal color and discharge, no lesions              Cervix: no lesions.  Pap collected.                Bimanual Exam:  Uterus:  normal size,  contour, position, consistency, mobility, non-tender              Adnexa: no mass, fullness, tenderness              Rectal exam: yes.  Confirms.              Anus:  normal sphincter tone, no lesions  Chaperone was present for exam:  yes.  ASSESSMENT  Well woman with gynecologic visit.  Status post BTL.  On Micronor for menorrhagia. Skipping cycles.  Possible perimenopause. Hx cryotherapy.  Migraines with aura.  HA with exercise.   PLAN  Pap and HR HPV collected. Mammogram recommended.  Self breast exam encouraged.  Refill of Micronor for one year. I suggested she may want to ask for Metformin XL instead of taking it bid.  Referral for colonsocopy.  Labs through medical providers.  Follow up annually and prn.    An After Visit Summary was printed and given to the patient.

## 2022-10-15 ENCOUNTER — Encounter: Payer: Self-pay | Admitting: Obstetrics and Gynecology

## 2022-10-15 ENCOUNTER — Other Ambulatory Visit: Payer: Self-pay | Admitting: Obstetrics and Gynecology

## 2022-10-15 ENCOUNTER — Ambulatory Visit (INDEPENDENT_AMBULATORY_CARE_PROVIDER_SITE_OTHER): Payer: 59 | Admitting: Obstetrics and Gynecology

## 2022-10-15 ENCOUNTER — Other Ambulatory Visit (HOSPITAL_COMMUNITY)
Admission: RE | Admit: 2022-10-15 | Discharge: 2022-10-15 | Disposition: A | Discharge status: Home / Self Care | Payer: 59 | Source: Ambulatory Visit | Attending: Obstetrics and Gynecology | Admitting: Obstetrics and Gynecology

## 2022-10-15 VITALS — BP 124/76 | Ht 64.0 in | Wt 260.0 lb

## 2022-10-15 DIAGNOSIS — Z1211 Encounter for screening for malignant neoplasm of colon: Secondary | ICD-10-CM

## 2022-10-15 DIAGNOSIS — Z124 Encounter for screening for malignant neoplasm of cervix: Secondary | ICD-10-CM

## 2022-10-15 DIAGNOSIS — Z01419 Encounter for gynecological examination (general) (routine) without abnormal findings: Secondary | ICD-10-CM

## 2022-10-15 DIAGNOSIS — Z1231 Encounter for screening mammogram for malignant neoplasm of breast: Secondary | ICD-10-CM

## 2022-10-15 MED ORDER — NORETHINDRONE 0.35 MG PO TABS: 1.0000 | ORAL_TABLET | Freq: Every day | ORAL | 3 refills | Status: DC

## 2022-10-15 NOTE — Patient Instructions (Signed)

## 2022-10-20 LAB — CYTOLOGY - PAP
Comment: NEGATIVE
Diagnosis: NEGATIVE
High risk HPV: NEGATIVE

## 2022-10-20 NOTE — Progress Notes (Signed)
Chief Complaint:   OBESITY Sarah Fields is here to discuss her progress with her obesity treatment plan along with follow-up of her obesity related diagnoses. Sarah Fields is on the Category 3 Plan and states she is following her eating plan approximately 70% of the time. Sarah Fields states she is doing arm exercises for 5 minutes 7 times per week.  Today's visit was #: 35 Starting weight: 295 lbs Starting date: 12/13/2020 Today's weight: 255 lbs Today's date: 10/02/2022 Total lbs lost to date: 40 Total lbs lost since last in-office visit: 1  Interim History: Sarah Fields has done well with her weight loss.  She notes a recent bout with severe pharyngitis and she had a course of steroids to assist with resolution.  She is much better now and she is getting back on track with her eating plan now that she can tolerate normal food intake.  Subjective:   1. Pre-diabetes Sarah Fields's last A1c was 5.2 and insulin 12.0.  She is taking metformin 500 mg twice daily with no side effects noted.  2. Other depression, emotional eating behaviors Sarah Fields is taking Wellbutrin with no side effects noted.  Assessment/Plan:   1. Pre-diabetes Sarah Fields will continue metformin, diet, and exercise.  We will refill metformin for 1 month.  - metFORMIN (GLUCOPHAGE) 500 MG tablet; Take 1 tablet (500 mg total) by mouth 2 (two) times daily with a meal. TAKE 1 TABLET BY MOUTH EVERY DAY WITH BREAKFAST  Dispense: 60 tablet; Refill: 0  2. Other depression, emotional eating behaviors Sarah Fields will continue Wellbutrin, and emotional eating behavior strategies were discussed with the patient today.  We will refill Wellbutrin SR for 1 month.  - buPROPion (WELLBUTRIN SR) 150 MG 12 hr tablet; Take 1 tablet (150 mg total) by mouth 2 (two) times daily.  Dispense: 60 tablet; Refill: 0  3. Obesity, Current BMI 43.8 Sarah Fields is currently in the action stage of change. As such, her goal is to continue with weight loss efforts. She has  agreed to the Category 3 Plan.   We will recheck fasting labs at her next visit.   Exercise goals: As is.   Behavioral modification strategies: increasing lean protein intake, meal planning and cooking strategies, and holiday eating strategies .  Sarah Fields has agreed to follow-up with our clinic in 3 weeks. She was informed of the importance of frequent follow-up visits to maximize her success with intensive lifestyle modifications for her multiple health conditions.   Objective:   Blood pressure 128/88, pulse (!) 106, temperature 98.3 F (36.8 C), height 5\' 4"  (1.626 m), weight 255 lb 6.4 oz (115.8 kg), SpO2 97 %. Body mass index is 43.84 kg/m.  General: Cooperative, alert, well developed, in no acute distress. HEENT: Conjunctivae and lids unremarkable. Cardiovascular: Regular rhythm.  Lungs: Normal work of breathing. Neurologic: No focal deficits.   Lab Results  Component Value Date   CREATININE 1.07 (H) 06/19/2022   BUN 15 06/19/2022   NA 139 06/19/2022   K 5.2 06/19/2022   CL 103 06/19/2022   CO2 23 06/19/2022   Lab Results  Component Value Date   ALT 11 06/19/2022   AST 12 06/19/2022   ALKPHOS 84 06/19/2022   BILITOT 0.3 06/19/2022   Lab Results  Component Value Date   HGBA1C 5.2 06/19/2022   HGBA1C 5.4 02/11/2022   HGBA1C 5.5 07/31/2021   HGBA1C 5.4 12/13/2020   HGBA1C 5.5 05/30/2015   Lab Results  Component Value Date   INSULIN 12.0 06/19/2022   INSULIN  17.3 02/11/2022   INSULIN 19.6 07/31/2021   INSULIN 21.4 12/13/2020   Lab Results  Component Value Date   TSH 4.200 12/13/2020   Lab Results  Component Value Date   CHOL 162 06/19/2022   HDL 40 06/19/2022   LDLCALC 93 06/19/2022   TRIG 168 (H) 06/19/2022   CHOLHDL 4.5 05/30/2015   Lab Results  Component Value Date   VD25OH 53.6 06/19/2022   VD25OH 64.7 02/11/2022   VD25OH 112.0 (H) 07/31/2021   Lab Results  Component Value Date   WBC 9.9 09/07/2021   HGB 14.2 09/07/2021   HCT 42.8  09/07/2021   MCV 93.4 09/07/2021   PLT 276 09/07/2021   No results found for: "IRON", "TIBC", "FERRITIN"  Attestation Statements:   Reviewed by clinician on day of visit: allergies, medications, problem list, medical history, surgical history, family history, social history, and previous encounter notes.   I, Burt Knack, am acting as transcriptionist for Quillian Quince, MD.  I have reviewed the above documentation for accuracy and completeness, and I agree with the above. -  Quillian Quince, MD

## 2022-10-23 ENCOUNTER — Ambulatory Visit (INDEPENDENT_AMBULATORY_CARE_PROVIDER_SITE_OTHER): Payer: 59 | Admitting: Family Medicine

## 2022-10-23 ENCOUNTER — Encounter (INDEPENDENT_AMBULATORY_CARE_PROVIDER_SITE_OTHER): Payer: Self-pay | Admitting: Family Medicine

## 2022-10-23 VITALS — BP 130/86 | HR 90 | Temp 98.4°F | Ht 64.0 in | Wt 258.0 lb

## 2022-10-23 DIAGNOSIS — Z6841 Body Mass Index (BMI) 40.0 and over, adult: Secondary | ICD-10-CM

## 2022-10-23 DIAGNOSIS — F439 Reaction to severe stress, unspecified: Secondary | ICD-10-CM | POA: Diagnosis not present

## 2022-10-23 DIAGNOSIS — E782 Mixed hyperlipidemia: Secondary | ICD-10-CM

## 2022-10-23 DIAGNOSIS — E559 Vitamin D deficiency, unspecified: Secondary | ICD-10-CM | POA: Diagnosis not present

## 2022-10-23 DIAGNOSIS — F3289 Other specified depressive episodes: Secondary | ICD-10-CM

## 2022-10-23 DIAGNOSIS — E669 Obesity, unspecified: Secondary | ICD-10-CM

## 2022-10-23 DIAGNOSIS — R7303 Prediabetes: Secondary | ICD-10-CM

## 2022-10-23 DIAGNOSIS — E538 Deficiency of other specified B group vitamins: Secondary | ICD-10-CM

## 2022-10-23 MED ORDER — METFORMIN HCL 500 MG PO TABS: 500.0000 mg | ORAL_TABLET | Freq: Two times a day (BID) | ORAL | 0 refills | Status: DC

## 2022-10-23 MED ORDER — BUPROPION HCL ER (SR) 150 MG PO TB12: 150.0000 mg | ORAL_TABLET | Freq: Two times a day (BID) | ORAL | 0 refills | Status: DC

## 2022-10-24 LAB — CMP14+EGFR
ALT: 7 IU/L (ref 0–32)
AST: 15 IU/L (ref 0–40)
Albumin/Globulin Ratio: 1.4 (ref 1.2–2.2)
Albumin: 3.9 g/dL (ref 3.9–4.9)
Alkaline Phosphatase: 92 IU/L (ref 44–121)
BUN/Creatinine Ratio: 14 (ref 9–23)
BUN: 13 mg/dL (ref 6–24)
Bilirubin Total: 0.3 mg/dL (ref 0.0–1.2)
CO2: 23 mmol/L (ref 20–29)
Calcium: 9.4 mg/dL (ref 8.7–10.2)
Chloride: 101 mmol/L (ref 96–106)
Creatinine, Ser: 0.92 mg/dL (ref 0.57–1.00)
Globulin, Total: 2.7 g/dL (ref 1.5–4.5)
Glucose: 78 mg/dL (ref 70–99)
Potassium: 4.9 mmol/L (ref 3.5–5.2)
Sodium: 138 mmol/L (ref 134–144)
Total Protein: 6.6 g/dL (ref 6.0–8.5)
eGFR: 77 mL/min/{1.73_m2} (ref >59)

## 2022-10-24 LAB — LIPID PANEL WITH LDL/HDL RATIO
Cholesterol, Total: 156 mg/dL (ref 100–199)
HDL: 38 mg/dL — ABNORMAL LOW (ref >39)
LDL Chol Calc (NIH): 88 mg/dL (ref 0–99)
LDL/HDL Ratio: 2.3 ratio (ref 0.0–3.2)
Triglycerides: 175 mg/dL — ABNORMAL HIGH (ref 0–149)
VLDL Cholesterol Cal: 30 mg/dL (ref 5–40)

## 2022-10-24 LAB — TSH: TSH: 4.51 u[IU]/mL — ABNORMAL HIGH (ref 0.450–4.500)

## 2022-10-24 LAB — VITAMIN B12: Vitamin B-12: 396 pg/mL (ref 232–1245)

## 2022-10-24 LAB — HEMOGLOBIN A1C
Est. average glucose Bld gHb Est-mCnc: 105 mg/dL
Hgb A1c MFr Bld: 5.3 % (ref 4.8–5.6)

## 2022-10-24 LAB — VITAMIN D 25 HYDROXY (VIT D DEFICIENCY, FRACTURES): Vit D, 25-Hydroxy: 48 ng/mL (ref 30.0–100.0)

## 2022-10-24 LAB — INSULIN, RANDOM: INSULIN: 13.2 u[IU]/mL (ref 2.6–24.9)

## 2022-10-28 ENCOUNTER — Encounter: Payer: Self-pay | Admitting: Internal Medicine

## 2022-11-04 MED ORDER — VITAMIN D (ERGOCALCIFEROL) 1.25 MG (50000 UNIT) PO CAPS: 50000.0000 [IU] | ORAL_CAPSULE | ORAL | 0 refills | Status: DC

## 2022-11-04 NOTE — Progress Notes (Signed)
Chief Complaint:   OBESITY Sarah Fields is here to discuss her progress with her obesity treatment plan along with follow-up of her obesity related diagnoses. Sarah Fields is on the Category 3 Plan and states she is following her eating plan approximately 70% of the time. Tamber states she is doing arm exercises for 10 minutes 5 times per week.  Today's visit was #: 10 Starting weight: 295 lbs Starting date: 12/13/2020 Today's weight: 258 lbs Today's date: 10/23/2022 Total lbs lost to date: 37 Total lbs lost since last in-office visit: 0  Interim History: [name] is retaining a bit of fluid today, and this is likely due to increased sodium over Thanksgiving.  She did better with portion control and making smarter choices over Thanksgiving.  Subjective:   1. Vitamin D deficiency Sarah Fields is on vitamin D, and she is due for labs.  2. Pre-diabetes Sarah Fields is working on her diet, and she has no side effects with metformin.  She is due to have labs.  3. Stress [name] is stressed about her daughter who is also trying to lose weight, but she has disordered eating and she is struggling.  4. B12 deficiency Sarah Fields has a history of B12 deficiency, and she is due for labs.  5. Mixed hyperlipidemia Sarah Fields is working on decreasing cholesterol in her diet.  She denies chest pains and she is due for labs.  6. Other depression, emotional eating behaviors Sarah Fields is stable on her medications, and she notes some increased stress at home, but she is working on decreasing emotional eating behaviors.  Assessment/Plan:   1. Vitamin D deficiency We will check labs today, and we will refill prescription vitamin D 50,000 IU every week for 1 month.   - VITAMIN D 25 Hydroxy (Vit-D Deficiency, Fractures)  2. Pre-diabetes We will check labs today, and we will refill metformin 500 mg twice daily for 1 month.  - metFORMIN (GLUCOPHAGE) 500 MG tablet; Take 1 tablet (500 mg total) by mouth 2  (two) times daily with a meal. TAKE 1 TABLET BY MOUTH EVERY DAY WITH BREAKFAST  Dispense: 60 tablet; Refill: 0 - CMP14+EGFR - Hemoglobin A1c - Insulin, random  3. Stress Sarah Fields was offered encouragement and support, and solutions to help with her stress were discussed.  We will follow-up at her next visit in 1 month.  4. B12 deficiency We will check labs today, and we will follow-up at Maxima's next visit.  - Vitamin B12  5. Mixed hyperlipidemia We will check labs today. Sarah Fields will continue to work on diet, exercise and weight loss efforts. Orders and follow up as documented in patient record.   - Lipid Panel With LDL/HDL Ratio - TSH  6. Other depression, emotional eating behaviors [name] will continue Wellbutrin SR 150 mg twice daily, and we will refill for 1 month.  - buPROPion (WELLBUTRIN SR) 150 MG 12 hr tablet; Take 1 tablet (150 mg total) by mouth 2 (two) times daily.  Dispense: 60 tablet; Refill: 0  7. Obesity, Current BMI 44.3 Sarah Fields is currently in the action stage of change. As such, her goal is to continue with weight loss efforts. She has agreed to the Category 3 Plan.   Exercise goals: As is.   Behavioral modification strategies: increasing lean protein intake and holiday eating strategies .  Sarah Fields has agreed to follow-up with our clinic in 4 weeks. She was informed of the importance of frequent follow-up visits to maximize her success with intensive lifestyle modifications for her multiple  health conditions.   Sarah Fields was informed we would discuss her lab results at her next visit unless there is a critical issue that needs to be addressed sooner. Sarah Fields agreed to keep her next visit at the agreed upon time to discuss these results.  Objective:   Blood pressure 130/86, pulse 90, temperature 98.4 F (36.9 C), height _0  (1.626 m), weight 258 lb (117 kg), last menstrual period 09/06/2022, SpO2 98 %. Body mass index is 44.29 kg/m.  General:  Cooperative, alert, well developed, in no acute distress. HEENT: Conjunctivae and lids unremarkable. Cardiovascular: Regular rhythm.  Lungs: Normal work of breathing. Neurologic: No focal deficits.   Lab Results  Component Value Date   CREATININE 0.92 10/23/2022   BUN 13 10/23/2022   NA 138 10/23/2022   K 4.9 10/23/2022   CL 101 10/23/2022   CO2 23 10/23/2022   Lab Results  Component Value Date   ALT 7 10/23/2022   AST 15 10/23/2022   ALKPHOS 92 10/23/2022   BILITOT 0.3 10/23/2022   Lab Results  Component Value Date   HGBA1C 5.3 10/23/2022   HGBA1C 5.2 06/19/2022   HGBA1C 5.4 02/11/2022   HGBA1C 5.5 07/31/2021   HGBA1C 5.4 12/13/2020   Lab Results  Component Value Date   INSULIN 13.2 10/23/2022   INSULIN 12.0 06/19/2022   INSULIN 17.3 02/11/2022   INSULIN 19.6 07/31/2021   INSULIN 21.4 12/13/2020   Lab Results  Component Value Date   TSH 4.510 (H) 10/23/2022   Lab Results  Component Value Date   CHOL 156 10/23/2022   HDL 38 (L) 10/23/2022   LDLCALC 88 10/23/2022   TRIG 175 (H) 10/23/2022   CHOLHDL 4.5 05/30/2015   Lab Results  Component Value Date   VD25OH 48.0 10/23/2022   VD25OH 53.6 06/19/2022   VD25OH 64.7 02/11/2022   Lab Results  Component Value Date   WBC 9.9 09/07/2021   HGB 14.2 09/07/2021   HCT 42.8 09/07/2021   MCV 93.4 09/07/2021   PLT 276 09/07/2021   No results found for: "IRON", "TIBC", "FERRITIN"  Attestation Statements:   Reviewed by clinician on day of visit: allergies, medications, problem list, medical history, surgical history, family history, social history, and previous encounter notes.   I, Trixie Dredge, am acting as transcriptionist for Dennard Nip, MD.  I have reviewed the above documentation for accuracy and completeness, and I agree with the above. -  Dennard Nip, MD

## 2022-11-25 ENCOUNTER — Ambulatory Visit (INDEPENDENT_AMBULATORY_CARE_PROVIDER_SITE_OTHER): Payer: 59 | Admitting: Family Medicine

## 2022-11-25 ENCOUNTER — Encounter (INDEPENDENT_AMBULATORY_CARE_PROVIDER_SITE_OTHER): Payer: Self-pay | Admitting: Family Medicine

## 2022-11-25 VITALS — BP 127/84 | HR 99 | Temp 98.2°F | Ht 64.0 in | Wt 259.0 lb

## 2022-11-25 DIAGNOSIS — E669 Obesity, unspecified: Secondary | ICD-10-CM

## 2022-11-25 DIAGNOSIS — F3289 Other specified depressive episodes: Secondary | ICD-10-CM | POA: Diagnosis not present

## 2022-11-25 DIAGNOSIS — R899 Unspecified abnormal finding in specimens from other organs, systems and tissues: Secondary | ICD-10-CM | POA: Insufficient documentation

## 2022-11-25 DIAGNOSIS — Z6841 Body Mass Index (BMI) 40.0 and over, adult: Secondary | ICD-10-CM

## 2022-11-25 DIAGNOSIS — E559 Vitamin D deficiency, unspecified: Secondary | ICD-10-CM | POA: Diagnosis not present

## 2022-11-25 DIAGNOSIS — R7303 Prediabetes: Secondary | ICD-10-CM

## 2022-11-25 MED ORDER — METFORMIN HCL 500 MG PO TABS: 500.0000 mg | ORAL_TABLET | Freq: Two times a day (BID) | ORAL | 1 refills | Status: DC

## 2022-11-25 MED ORDER — VITAMIN D (ERGOCALCIFEROL) 1.25 MG (50000 UNIT) PO CAPS: 50000.0000 [IU] | ORAL_CAPSULE | ORAL | 1 refills | Status: DC

## 2022-11-25 MED ORDER — BUPROPION HCL ER (SR) 150 MG PO TB12: 150.0000 mg | ORAL_TABLET | Freq: Two times a day (BID) | ORAL | 1 refills | Status: DC

## 2022-12-08 NOTE — Progress Notes (Unsigned)
Chief Complaint:   OBESITY Sarah Fields is here to discuss her progress with her obesity treatment plan along with follow-up of her obesity related diagnoses. Sarah Fields is on the Category 3 Plan and states she is following her eating plan approximately 50% of the time. Sarah Fields states she is doing arm exercise for 5 minutes 1 time per week.  Today's visit was #: 53 Starting weight: 295 lbs Starting date: 12/13/2020 Today's weight: 259 lbs Today's date: 11/25/2022 Total lbs lost to date: 36 Total lbs lost since last in-office visit: 0  Interim History: Sarah Fields did very well with with minimizing holiday weight gain.  She notes decreased appetite and she has been skipping meals.  Subjective:   1. Pre-diabetes Sarah Fields is working on decreasing sugar in her diet.  She denies nausea or vomiting with metformin.  2. Vitamin D deficiency Sarah Fields has been off vitamin D and her levels have been slowly dropping, and she is now a little below goal.  This is likely to worsen over the winter.  3. Abnormal laboratory test Sarah Fields's TSH was mildly elevated, but she notes some increased fatigue which may be due to a variety of causes.  4. Emotional Eating Behavior Sarah Fields is stable on her medications, with no side effects noted.  Assessment/Plan:   1. Pre-diabetes Sarah Fields will continue metformin, and we will refill for 2 months.  - metFORMIN (GLUCOPHAGE) 500 MG tablet; Take 1 tablet (500 mg total) by mouth 2 (two) times daily with a meal. TAKE 1 TABLET BY MOUTH EVERY DAY WITH BREAKFAST  Dispense: 60 tablet; Refill: 1  2. Vitamin D deficiency Sarah Fields agreed to start prescription vitamin D 50,000 IU once weekly, with a 2-month supply.  - Vitamin D, Ergocalciferol, (DRISDOL) 1.25 MG (50000 UNIT) CAPS capsule; Take 1 capsule (50,000 Units total) by mouth every 7 (seven) days.  Dispense: 4 capsule; Refill: 1  3. Abnormal laboratory test We will recheck her TSH again today, Sarah Fields may need  to start Synthroid.  - TSH  4. Emotional Eating Behavior Sarah Fields will continue Wellbutrin SR, and we will refill for 2 months.  - buPROPion (WELLBUTRIN SR) 150 MG 12 hr tablet; Take 1 tablet (150 mg total) by mouth 2 (two) times daily.  Dispense: 60 tablet; Refill: 1  5. Obesity, Current BMI 44.6 Sarah Fields is currently in the action stage of change. As such, her goal is to continue with weight loss efforts. She has agreed to the Category 3 Plan.   Exercise goals: Wall pilates.  Behavioral modification strategies: increasing lean protein intake and no skipping meals.  Sarah Fields has agreed to follow-up with our clinic in 6 weeks. She was informed of the importance of frequent follow-up visits to maximize her success with intensive lifestyle modifications for her multiple health conditions.   Objective:   Blood pressure 127/84, pulse 99, temperature 98.2 F (36.8 C), height 5\' 4"  (1.626 m), weight 259 lb (117.5 kg), SpO2 97 %. Body mass index is 44.46 kg/m.  General: Cooperative, alert, well developed, in no acute distress. HEENT: Conjunctivae and lids unremarkable. Cardiovascular: Regular rhythm.  Lungs: Normal work of breathing. Neurologic: No focal deficits.   Lab Results  Component Value Date   CREATININE 0.92 10/23/2022   BUN 13 10/23/2022   NA 138 10/23/2022   K 4.9 10/23/2022   CL 101 10/23/2022   CO2 23 10/23/2022   Lab Results  Component Value Date   ALT 7 10/23/2022   AST 15 10/23/2022   ALKPHOS 92 10/23/2022  BILITOT 0.3 10/23/2022   Lab Results  Component Value Date   HGBA1C 5.3 10/23/2022   HGBA1C 5.2 06/19/2022   HGBA1C 5.4 02/11/2022   HGBA1C 5.5 07/31/2021   HGBA1C 5.4 12/13/2020   Lab Results  Component Value Date   INSULIN 13.2 10/23/2022   INSULIN 12.0 06/19/2022   INSULIN 17.3 02/11/2022   INSULIN 19.6 07/31/2021   INSULIN 21.4 12/13/2020   Lab Results  Component Value Date   TSH 4.510 (H) 10/23/2022   Lab Results  Component Value  Date   CHOL 156 10/23/2022   HDL 38 (L) 10/23/2022   LDLCALC 88 10/23/2022   TRIG 175 (H) 10/23/2022   CHOLHDL 4.5 05/30/2015   Lab Results  Component Value Date   VD25OH 48.0 10/23/2022   VD25OH 53.6 06/19/2022   VD25OH 64.7 02/11/2022   Lab Results  Component Value Date   WBC 9.9 09/07/2021   HGB 14.2 09/07/2021   HCT 42.8 09/07/2021   MCV 93.4 09/07/2021   PLT 276 09/07/2021   No results found for: "IRON", "TIBC", "FERRITIN"  Attestation Statements:   Reviewed by clinician on day of visit: allergies, medications, problem list, medical history, surgical history, family history, social history, and previous encounter notes.   I, Trixie Dredge, am acting as transcriptionist for Dennard Nip, MD.  I have reviewed the above documentation for accuracy and completeness, and I agree with the above. -  Dennard Nip, MD

## 2022-12-09 ENCOUNTER — Encounter

## 2022-12-23 ENCOUNTER — Ambulatory Visit
Admission: RE | Admit: 2022-12-23 | Discharge: 2022-12-23 | Disposition: A | Discharge status: Home / Self Care | Payer: 59 | Source: Ambulatory Visit | Attending: Obstetrics and Gynecology | Admitting: Obstetrics and Gynecology

## 2022-12-23 DIAGNOSIS — Z1231 Encounter for screening mammogram for malignant neoplasm of breast: Secondary | ICD-10-CM

## 2022-12-25 ENCOUNTER — Encounter (INDEPENDENT_AMBULATORY_CARE_PROVIDER_SITE_OTHER): Payer: Self-pay | Admitting: Family Medicine

## 2022-12-25 ENCOUNTER — Ambulatory Visit (INDEPENDENT_AMBULATORY_CARE_PROVIDER_SITE_OTHER): Payer: 59 | Admitting: Family Medicine

## 2022-12-25 VITALS — BP 127/87 | HR 88 | Temp 98.9°F | Ht 64.0 in

## 2022-12-25 DIAGNOSIS — E559 Vitamin D deficiency, unspecified: Secondary | ICD-10-CM

## 2022-12-25 DIAGNOSIS — R7303 Prediabetes: Secondary | ICD-10-CM | POA: Diagnosis not present

## 2022-12-25 DIAGNOSIS — E669 Obesity, unspecified: Secondary | ICD-10-CM

## 2022-12-25 DIAGNOSIS — R7989 Other specified abnormal findings of blood chemistry: Secondary | ICD-10-CM | POA: Insufficient documentation

## 2022-12-25 DIAGNOSIS — F3289 Other specified depressive episodes: Secondary | ICD-10-CM

## 2022-12-25 DIAGNOSIS — Z6841 Body Mass Index (BMI) 40.0 and over, adult: Secondary | ICD-10-CM

## 2022-12-25 MED ORDER — METFORMIN HCL 500 MG PO TABS: 500.0000 mg | ORAL_TABLET | Freq: Two times a day (BID) | ORAL | 1 refills | Status: DC

## 2022-12-25 MED ORDER — VITAMIN D (ERGOCALCIFEROL) 1.25 MG (50000 UNIT) PO CAPS: 50000.0000 [IU] | ORAL_CAPSULE | ORAL | 1 refills | Status: DC

## 2022-12-25 MED ORDER — BUPROPION HCL ER (SR) 150 MG PO TB12: 150.0000 mg | ORAL_TABLET | Freq: Two times a day (BID) | ORAL | 1 refills | Status: DC

## 2022-12-26 LAB — T4, FREE: Free T4: 0.91 ng/dL (ref 0.82–1.77)

## 2022-12-26 LAB — T3: T3, Total: 138 ng/dL (ref 71–180)

## 2022-12-26 LAB — TSH: TSH: 4.58 u[IU]/mL — ABNORMAL HIGH (ref 0.450–4.500)

## 2023-01-06 NOTE — Progress Notes (Unsigned)
Chief Complaint:   OBESITY Sarah Fields is here to discuss her progress with her obesity treatment plan along with follow-up of her obesity related diagnoses. Sarah Fields is on the Category 3 Plan and states she is following her eating plan approximately 80% of the time. Sarah Fields states she is bike riding for 10 minutes 1 time per week.  Today's visit was #: 26 Starting weight: 295 lbs Starting date: 12/13/2020 Today's weight: 258 lbs Today's date: 12/25/2022 Total lbs lost to date: 37 Total lbs lost since last in-office visit: 1  Interim History: Sarah Fields continues to work on her diet, and she did some traveling which made following her plan more difficult.  She started back to using her exercise bike a little bit.  Subjective:   1. Pre-diabetes Sarah Fields is stable on metformin, and she is working on her diet.  No side effects were noted.  2. Vitamin D deficiency Sarah Fields is doing well on prescription vitamin D with no side effects noted.  3. TSH elevation Sarah Fields's last TSH was elevated.  No palpitations or tremors were noted.  4. Emotional Eating Behavior Sarah Fields continues to work on [employer] behaviors.  She feels Wellbutrin is helping, and her blood pressure is stable.  Assessment/Plan:   1. Pre-diabetes Sarah Fields will continue metformin, and we will refill for 2 months.  - metFORMIN (GLUCOPHAGE) 500 MG tablet; Take 1 tablet (500 mg total) by mouth 2 (two) times daily with a meal. TAKE 1 TABLET BY MOUTH EVERY DAY WITH BREAKFAST  Dispense: 60 tablet; Refill: 1  2. Vitamin D deficiency Sarah Fields will continue prescription vitamin D, and we will refill for 2 months.  - Vitamin D, Ergocalciferol, (DRISDOL) 1.25 MG (50000 UNIT) CAPS capsule; Take 1 capsule (50,000 Units total) by mouth every 7 (seven) days.  Dispense: 4 capsule; Refill: 1  3. TSH elevation We will check labs today and we will follow-up at her next visit.  May need an endocrinology referral  depending on lab results.  - T4, free - T3 - TSH  4. Emotional Eating Behavior Sarah Fields will continue Wellbutrin SR, and we will refill for 2 months.  - buPROPion (WELLBUTRIN SR) 150 MG 12 hr tablet; Take 1 tablet (150 mg total) by mouth 2 (two) times daily.  Dispense: 60 tablet; Refill: 1  5. BMI 40.0-44.9, adult ([place])  6. Obesity, Beginning BMI 50.64 Sarah Fields is currently in the action stage of change. As such, her goal is to continue with weight loss efforts. She has agreed to the Category 3 Plan.   Exercise goals: Strategies to help motivate her exercise were discussed.   Behavioral modification strategies: increasing lean protein intake.  Sarah Fields has agreed to follow-up with our clinic in 4 weeks. She was informed of the importance of frequent follow-up visits to maximize her success with intensive lifestyle modifications for her multiple health conditions.   Sarah Fields was informed we would discuss her lab results at her next visit unless there is a critical issue that needs to be addressed sooner. Sarah Fields agreed to keep her next visit at the agreed upon time to discuss these results.  Objective:   Blood pressure 127/87, pulse 88, temperature 98.9 F (37.2 C), height 5' 4"$  (1.626 m), SpO2 98 %. Body mass index is 44.46 kg/m.  General: Cooperative, alert, well developed, in no acute distress. HEENT: Conjunctivae and lids unremarkable. Cardiovascular: Regular rhythm.  Lungs: Normal work of breathing. Neurologic: No focal deficits.   Lab Results  Component Value Date  CREATININE 0.92 10/23/2022   BUN 13 10/23/2022   NA 138 10/23/2022   K 4.9 10/23/2022   CL 101 10/23/2022   CO2 23 10/23/2022   Lab Results  Component Value Date   ALT 7 10/23/2022   AST 15 10/23/2022   ALKPHOS 92 10/23/2022   BILITOT 0.3 10/23/2022   Lab Results  Component Value Date   HGBA1C 5.3 10/23/2022   HGBA1C 5.2 06/19/2022   HGBA1C 5.4 02/11/2022   HGBA1C 5.5 07/31/2021   HGBA1C  5.4 12/13/2020   Lab Results  Component Value Date   INSULIN 13.2 10/23/2022   INSULIN 12.0 06/19/2022   INSULIN 17.3 02/11/2022   INSULIN 19.6 07/31/2021   INSULIN 21.4 12/13/2020   Lab Results  Component Value Date   TSH 4.580 (H) 12/25/2022   Lab Results  Component Value Date   CHOL 156 10/23/2022   HDL 38 (L) 10/23/2022   LDLCALC 88 10/23/2022   TRIG 175 (H) 10/23/2022   CHOLHDL 4.5 05/30/2015   Lab Results  Component Value Date   VD25OH 48.0 10/23/2022   VD25OH 53.6 06/19/2022   VD25OH 64.7 02/11/2022   Lab Results  Component Value Date   WBC 9.9 09/07/2021   HGB 14.2 09/07/2021   HCT 42.8 09/07/2021   MCV 93.4 09/07/2021   PLT 276 09/07/2021   No results found for: "IRON", "TIBC", "FERRITIN"  Attestation Statements:   Reviewed by clinician on day of visit: allergies, medications, problem list, medical history, surgical history, family history, social history, and previous encounter notes.   I, Trixie Dredge, am acting as transcriptionist for Dennard Nip, MD.  I have reviewed the above documentation for accuracy and completeness, and I agree with the above. -  Dennard Nip, MD

## 2023-01-15 ENCOUNTER — Encounter

## 2023-01-15 ENCOUNTER — Inpatient Hospital Stay: Admit: 2023-01-15 | Payer: BLUE CROSS/BLUE SHIELD | Primary: Family Medicine

## 2023-01-15 DIAGNOSIS — Z139 Encounter for screening, unspecified: Secondary | ICD-10-CM

## 2023-01-16 ENCOUNTER — Encounter: Payer: 59 | Admitting: Internal Medicine

## 2023-01-20 ENCOUNTER — Ambulatory Visit (INDEPENDENT_AMBULATORY_CARE_PROVIDER_SITE_OTHER): Payer: 59 | Admitting: Family Medicine

## 2023-01-20 ENCOUNTER — Encounter (INDEPENDENT_AMBULATORY_CARE_PROVIDER_SITE_OTHER): Payer: Self-pay | Admitting: Family Medicine

## 2023-01-20 VITALS — BP 141/95 | HR 102 | Temp 98.3°F | Ht 64.0 in | Wt 260.0 lb

## 2023-01-20 DIAGNOSIS — R7303 Prediabetes: Secondary | ICD-10-CM

## 2023-01-20 DIAGNOSIS — E669 Obesity, unspecified: Secondary | ICD-10-CM

## 2023-01-20 DIAGNOSIS — F3289 Other specified depressive episodes: Secondary | ICD-10-CM | POA: Diagnosis not present

## 2023-01-20 DIAGNOSIS — Z6841 Body Mass Index (BMI) 40.0 and over, adult: Secondary | ICD-10-CM

## 2023-01-20 DIAGNOSIS — E559 Vitamin D deficiency, unspecified: Secondary | ICD-10-CM

## 2023-01-20 MED ORDER — BUPROPION HCL ER (SR) 150 MG PO TB12: 150.0000 mg | ORAL_TABLET | Freq: Two times a day (BID) | ORAL | 1 refills | Status: DC

## 2023-01-20 MED ORDER — VITAMIN D (ERGOCALCIFEROL) 1.25 MG (50000 UNIT) PO CAPS: 50000.0000 [IU] | ORAL_CAPSULE | ORAL | 1 refills | Status: DC

## 2023-01-20 MED ORDER — METFORMIN HCL 500 MG PO TABS: 500.0000 mg | ORAL_TABLET | Freq: Two times a day (BID) | ORAL | 1 refills | Status: DC

## 2023-01-30 ENCOUNTER — Inpatient Hospital Stay: Admit: 2023-01-30 | Payer: BLUE CROSS/BLUE SHIELD | Primary: Family Medicine

## 2023-01-30 DIAGNOSIS — R928 Other abnormal and inconclusive findings on diagnostic imaging of breast: Secondary | ICD-10-CM

## 2023-01-31 ENCOUNTER — Other Ambulatory Visit (INDEPENDENT_AMBULATORY_CARE_PROVIDER_SITE_OTHER): Payer: Self-pay | Admitting: Family Medicine

## 2023-01-31 DIAGNOSIS — R7303 Prediabetes: Secondary | ICD-10-CM

## 2023-02-09 NOTE — Progress Notes (Signed)
Chief Complaint:   OBESITY Sarah Fields is here to discuss her progress with her obesity treatment plan along with follow-up of her obesity related diagnoses. Sarah Fields is on the Category 3 Plan and states she is following her eating plan approximately 80% of the time. Sarah Fields states she is doing 0 minutes 0 times per week.  Today's visit was #: 102 Starting weight: 295 lbs Starting date: 12/13/2020 Today's weight: 260 lbs Today's date: 01/20/2023 Total lbs lost to date: 35 Total lbs lost since last in-office visit: 0  Interim History: Sarah Fields did some celebration eating recently and her water intake has increased.  She is getting bored with some of her food choices and her finances have been tight lately.  Subjective:   1. Pre-diabetes Sarah Fields is stable on metformin and she is doing well with remembering to take both doses.  2. Vitamin D deficiency Sarah Fields is stable on vitamin D, and she requests a refill today.  3. Emotional Eating Behavior Sarah Fields is stable on Wellbutrin, and she has had some episodes of restlessness at night but she is generally getting 7 hours of sleep.  Assessment/Plan:   1. Pre-diabetes Sarah Fields will continue metformin, and we will refill for 2 months.  - metFORMIN (GLUCOPHAGE) 500 MG tablet; Take 1 tablet (500 mg total) by mouth 2 (two) times daily with a meal. TAKE 1 TABLET BY MOUTH EVERY DAY WITH BREAKFAST  Dispense: 60 tablet; Refill: 1  2. Vitamin D deficiency Sarah Fields will continue prescription vitamin D, and we will refill for 2 months.  - Vitamin D, Ergocalciferol, (DRISDOL) 1.25 MG (50000 UNIT) CAPS capsule; Take 1 capsule (50,000 Units total) by mouth every 7 (seven) days.  Dispense: 4 capsule; Refill: 1  3. Emotional Eating Behavior Sarah Fields will continue Wellbutrin SR, and we will refill for 2 months.  - buPROPion (WELLBUTRIN SR) 150 MG 12 hr tablet; Take 1 tablet (150 mg total) by mouth 2 (two) times daily.  Dispense: 60 tablet;  Refill: 1  4. BMI 40.0-44.9, adult ([place])  5. Obesity, Beginning BMI 50.64 Sarah Fields is currently in the action stage of change. As such, her goal is to continue with weight loss efforts. She has agreed to the Category 3 Plan.   Shredded [employer] were discussed and given.  Patient is to maybe look at [facility] for somewhat lower prices.  Behavioral modification strategies: increasing lean protein intake.  Sarah Fields has agreed to follow-up with our clinic in 3 to 4 weeks. She was informed of the importance of frequent follow-up visits to maximize her success with intensive lifestyle modifications for her multiple health conditions.   Objective:   Blood pressure (!) 141/95, pulse (!) 102, temperature 98.3 F (36.8 C), height 5\' 4"  (1.626 m), weight 260 lb (117.9 kg), SpO2 95 %. Body mass index is 44.63 kg/m.  Lab Results  Component Value Date   CREATININE 0.92 10/23/2022   BUN 13 10/23/2022   NA 138 10/23/2022   K 4.9 10/23/2022   CL 101 10/23/2022   CO2 23 10/23/2022   Lab Results  Component Value Date   ALT 7 10/23/2022   AST 15 10/23/2022   ALKPHOS 92 10/23/2022   BILITOT 0.3 10/23/2022   Lab Results  Component Value Date   HGBA1C 5.3 10/23/2022   HGBA1C 5.2 06/19/2022   HGBA1C 5.4 02/11/2022   HGBA1C 5.5 07/31/2021   HGBA1C 5.4 12/13/2020   Lab Results  Component Value Date   INSULIN 13.2 10/23/2022   INSULIN 12.0 06/19/2022  INSULIN 17.3 02/11/2022   INSULIN 19.6 07/31/2021   INSULIN 21.4 12/13/2020   Lab Results  Component Value Date   TSH 4.580 (H) 12/25/2022   Lab Results  Component Value Date   CHOL 156 10/23/2022   HDL 38 (L) 10/23/2022   LDLCALC 88 10/23/2022   TRIG 175 (H) 10/23/2022   CHOLHDL 4.5 05/30/2015   Lab Results  Component Value Date   VD25OH 48.0 10/23/2022   VD25OH 53.6 06/19/2022   VD25OH 64.7 02/11/2022   Lab Results  Component Value Date   WBC 9.9 09/07/2021   HGB 14.2 09/07/2021   HCT 42.8 09/07/2021   MCV 93.4  09/07/2021   PLT 276 09/07/2021   No results found for: "IRON", "TIBC", "FERRITIN"  Attestation Statements:   Reviewed by clinician on day of visit: allergies, medications, problem list, medical history, surgical history, family history, social history, and previous encounter notes.   I, Trixie Dredge, am acting as transcriptionist for Dennard Nip, MD.  I have reviewed the above documentation for accuracy and completeness, and I agree with the above. -  Dennard Nip, MD

## 2023-02-10 ENCOUNTER — Encounter (INDEPENDENT_AMBULATORY_CARE_PROVIDER_SITE_OTHER): Payer: Self-pay | Admitting: Family Medicine

## 2023-02-10 ENCOUNTER — Ambulatory Visit (INDEPENDENT_AMBULATORY_CARE_PROVIDER_SITE_OTHER): Payer: 59 | Admitting: Family Medicine

## 2023-02-10 VITALS — BP 136/81 | HR 90 | Temp 98.0°F | Ht 64.0 in | Wt 259.0 lb

## 2023-02-10 DIAGNOSIS — E669 Obesity, unspecified: Secondary | ICD-10-CM

## 2023-02-10 DIAGNOSIS — E559 Vitamin D deficiency, unspecified: Secondary | ICD-10-CM

## 2023-02-10 DIAGNOSIS — Z6841 Body Mass Index (BMI) 40.0 and over, adult: Secondary | ICD-10-CM | POA: Diagnosis not present

## 2023-02-10 DIAGNOSIS — R7303 Prediabetes: Secondary | ICD-10-CM | POA: Diagnosis not present

## 2023-02-16 NOTE — Progress Notes (Unsigned)
Chief Complaint:   OBESITY Nykerria is here to discuss her progress with her obesity treatment plan along with follow-up of her obesity related diagnoses. Milia is on the Category 3 Plan and states she is following her eating plan approximately 80% of the time. Sofya states she is riding the bike for 1 time per week.  Today's visit was #: 74 Starting weight: 295 lbs Starting date: 12/13/2020 Today's weight: 259 lbs Today's date: 02/10/2023 Total lbs lost to date: 36 Total lbs lost since last in-office visit: 1  Interim History: Mayuko continues to work on her weight loss. She has had increased stress and she has skipped meals. She is working on increasing her activities now. She has lost anther pound despite her stress level.   Subjective:   1. Pre-diabetes Hewan is on metformin and she is working on her diet and trying to increase her exercise. She struggles to meet her protein goals in time.   2. Vitamin D deficiency Delinda is stable on Vitamin D, and she is due to have labs rechecked soon.   Assessment/Plan:   1. Pre-diabetes Vickiana will continue metformin, and will work on decreasing simple carbohydrates. We will recheck labs in 1-2 months. We reviewed higher protein and lower calorie foods.   2. Vitamin D deficiency Sheylin will continue Vitamin D prescription, and we will recheck labs in 1 month.   3. BMI 40.0-44.9, adult ([place])  4. Obesity, Beginning BMI 50.64 Ammanda is currently in the action stage of change. As such, her goal is to continue with weight loss efforts. She has agreed to the Category 3 Plan.   Exercise goals: All adults should avoid inactivity. Some physical activity is better than none, and adults who participate in any amount of physical activity gain some health benefits.  Behavioral modification strategies: increasing lean protein intake and no skipping meals.  Bekka has agreed to follow-up with our clinic in 4 weeks. She was  informed of the importance of frequent follow-up visits to maximize her success with intensive lifestyle modifications for her multiple health conditions.   Objective:   Blood pressure 136/81, pulse 90, temperature 98 F (36.7 C), height 5\' 4"  (1.626 m), weight 259 lb (117.5 kg), SpO2 97 %. Body mass index is 44.46 kg/m.  Lab Results  Component Value Date   CREATININE 0.92 10/23/2022   BUN 13 10/23/2022   NA 138 10/23/2022   K 4.9 10/23/2022   CL 101 10/23/2022   CO2 23 10/23/2022   Lab Results  Component Value Date   ALT 7 10/23/2022   AST 15 10/23/2022   ALKPHOS 92 10/23/2022   BILITOT 0.3 10/23/2022   Lab Results  Component Value Date   HGBA1C 5.3 10/23/2022   HGBA1C 5.2 06/19/2022   HGBA1C 5.4 02/11/2022   HGBA1C 5.5 07/31/2021   HGBA1C 5.4 12/13/2020   Lab Results  Component Value Date   INSULIN 13.2 10/23/2022   INSULIN 12.0 06/19/2022   INSULIN 17.3 02/11/2022   INSULIN 19.6 07/31/2021   INSULIN 21.4 12/13/2020   Lab Results  Component Value Date   TSH 4.580 (H) 12/25/2022   Lab Results  Component Value Date   CHOL 156 10/23/2022   HDL 38 (L) 10/23/2022   LDLCALC 88 10/23/2022   TRIG 175 (H) 10/23/2022   CHOLHDL 4.5 05/30/2015   Lab Results  Component Value Date   VD25OH 48.0 10/23/2022   VD25OH 53.6 06/19/2022   VD25OH 64.7 02/11/2022   Lab Results  Component Value Date   WBC 9.9 09/07/2021   HGB 14.2 09/07/2021   HCT 42.8 09/07/2021   MCV 93.4 09/07/2021   PLT 276 09/07/2021   No results found for: "IRON", "TIBC", "FERRITIN"  Attestation Statements:   Reviewed by clinician on day of visit: allergies, medications, problem list, medical history, surgical history, family history, social history, and previous encounter notes.  Time spent on visit including pre-visit chart review and post-visit care and charting was 30 minutes.   I, Trixie Dredge, am acting as transcriptionist for Dennard Nip, MD.  I have reviewed the above  documentation for accuracy and completeness, and I agree with the above. -  Dennard Nip, MD

## 2023-02-24 ENCOUNTER — Ambulatory Visit: Payer: 59 | Admitting: Nurse Practitioner

## 2023-02-24 ENCOUNTER — Encounter: Payer: Self-pay | Admitting: Nurse Practitioner

## 2023-02-24 VITALS — BP 120/86 | HR 100 | Ht 64.0 in | Wt 265.4 lb

## 2023-02-24 DIAGNOSIS — G250 Essential tremor: Secondary | ICD-10-CM

## 2023-02-24 DIAGNOSIS — G43909 Migraine, unspecified, not intractable, without status migrainosus: Secondary | ICD-10-CM | POA: Diagnosis not present

## 2023-02-24 DIAGNOSIS — E88819 Insulin resistance, unspecified: Secondary | ICD-10-CM

## 2023-02-24 DIAGNOSIS — R7303 Prediabetes: Secondary | ICD-10-CM | POA: Diagnosis not present

## 2023-02-24 NOTE — Assessment & Plan Note (Signed)
Doing well on nurtec. Take as needed for acute migraine. May also take maxalt if needed. Refills to be provided PRN.

## 2023-02-24 NOTE — Assessment & Plan Note (Signed)
Well managed. Continue current medication.

## 2023-02-24 NOTE — Assessment & Plan Note (Signed)
Continue metformin as prescribed. Regular visits with [facility] as scheduled.

## 2023-02-24 NOTE — Progress Notes (Signed)
Established patient visit   Patient: Sarah Fields   DOB: 13-Feb-1976   47 y.o. Female  MRN: [id number] Visit Date: 02/24/2023   Chief Complaint  Patient presents with   Medical Management of Chronic Issues   Subjective    HPI  Follow up  -migraines  --more severe around menstrual cycles  --trial of nurtec at last visit.  -has been able to get this filled -states that the nurtec really works well. Had no negative side effects.  --continued maxalt as needed.  Has not needed this since starting on Nurtec.  -sees provider at [facility] --doing well and losing weight on Metformin.   -does have essential tremor.  -worse when cold, tired, or hungry.  -doing well on current dose Primidone.   -She denies chest pain, chest pressure, or shortness of breath. she denies headaches or visual disturbances. She denies abdominal pain, nausea, vomiting, or changes in bowel or bladder habits.     Medications: Outpatient Medications Prior to Visit  Medication Sig   albuterol (PROVENTIL HFA;VENTOLIN HFA) 108 (90 BASE) MCG/ACT inhaler Inhale 2 puffs into the lungs every 6 (six) hours as needed for wheezing or shortness of breath.    Azelastine-Fluticasone 137-50 MCG/ACT SUSP Place 1 spray into both nostrils 2 (two) times daily.   buPROPion (WELLBUTRIN SR) 150 MG 12 hr tablet Take 1 tablet (150 mg total) by mouth 2 (two) times daily.   EPINEPHrine 0.3 mg/0.3 mL IJ SOAJ injection INJECT IN OUTER THIGH AS NEEDED FOR ANAPHYLAXIS   fluticasone (FLONASE) 50 MCG/ACT nasal spray Place 2 sprays into both nostrils daily.   levocetirizine (XYZAL) 5 MG tablet SMARTSIG:1 Tablet(s) By Mouth Every Evening   Magnesium 400 MG CAPS Take 1 tablet by mouth daily.   metFORMIN (GLUCOPHAGE) 500 MG tablet Take 1 tablet (500 mg total) by mouth 2 (two) times daily with a meal. TAKE 1 TABLET BY MOUTH EVERY DAY WITH BREAKFAST   montelukast (SINGULAIR) 10 MG tablet Take 10 mg by mouth at bedtime.     norethindrone (INCASSIA) 0.35 MG tablet Take 1 tablet (0.35 mg total) by mouth daily.   polyethylene glycol (MIRALAX / GLYCOLAX) 17 g packet Take 17 g by mouth daily. With increased water intake.   primidone (MYSOLINE) 50 MG tablet TAKE 1 TABLET BY MOUTH EVERYDAY AT BEDTIME   Rimegepant Sulfate (NURTEC) 75 MG TBDP Take 1 tablet by mouth x 1 dose for acute headache. (Max 75 mg/day)   rizatriptan (MAXALT) 10 MG tablet Take 1 tablet (10 mg total) by mouth as needed for migraine. May repeat in 2 hours if needed   sodium chloride (OCEAN) 0.65 % SOLN nasal spray Place 1 spray into both nostrils as needed for congestion.   SYMBICORT 160-4.5 MCG/ACT inhaler    Vitamin D, Ergocalciferol, (DRISDOL) 1.25 MG (50000 UNIT) CAPS capsule Take 1 capsule (50,000 Units total) by mouth every 7 (seven) days.   No facility-administered medications prior to visit.    Review of Systems See HPI    Last CBC Lab Results  Component Value Date   WBC 9.9 09/07/2021   HGB 14.2 09/07/2021   HCT 42.8 09/07/2021   MCV 93.4 09/07/2021   MCH 31.0 09/07/2021   RDW 12.5 09/07/2021   PLT 276 [id number]   Last metabolic panel Lab Results  Component Value Date   GLUCOSE 78 10/23/2022   NA 138 10/23/2022   K 4.9 10/23/2022   CL 101 10/23/2022   CO2 23 10/23/2022   BUN  13 10/23/2022   CREATININE 0.92 10/23/2022   EGFR 77 10/23/2022   CALCIUM 9.4 10/23/2022   PROT 6.6 10/23/2022   ALBUMIN 3.9 10/23/2022   LABGLOB 2.7 10/23/2022   AGRATIO 1.4 10/23/2022   BILITOT 0.3 10/23/2022   ALKPHOS 92 10/23/2022   AST 15 10/23/2022   ALT 7 10/23/2022   ANIONGAP 9 09/07/2021   Last lipids Lab Results  Component Value Date   CHOL 156 10/23/2022   HDL 38 (L) 10/23/2022   LDLCALC 88 10/23/2022   TRIG 175 (H) 10/23/2022   CHOLHDL 4.5 05/30/2015   Last hemoglobin A1c Lab Results  Component Value Date   HGBA1C 5.3 10/23/2022   Last thyroid functions Lab Results  Component Value Date   TSH 4.580 (H) 12/25/2022    T3TOTAL 138 12/25/2022   T4TOTAL 6.7 12/13/2020   Last vitamin D Lab Results  Component Value Date   VD25OH 48.0 10/23/2022       Objective     Today's Vitals   02/24/23 1329  BP: 120/86  Pulse: 100  SpO2: 98%  Weight: 265 lb 6.4 oz (120.4 kg)  Height: 5\' 4"  (1.626 m)   Body mass index is 45.56 kg/m.  BP Readings from Last 3 Encounters:  02/24/23 120/86  02/10/23 136/81  01/20/23 (Abnormal) 141/95    Wt Readings from Last 3 Encounters:  02/24/23 265 lb 6.4 oz (120.4 kg)  02/10/23 259 lb (117.5 kg)  01/20/23 260 lb (117.9 kg)    Physical Exam Vitals and nursing note reviewed.  Constitutional:      Appearance: Normal appearance. She is well-developed.  HENT:     Head: Normocephalic and atraumatic.     Nose: Nose normal.     Mouth/Throat:     Mouth: Mucous membranes are moist.     Pharynx: Oropharynx is clear.  Eyes:     Extraocular Movements: Extraocular movements intact.     Conjunctiva/sclera: Conjunctivae normal.     Pupils: Pupils are equal, round, and reactive to light.  Neck:     Vascular: No carotid bruit.  Cardiovascular:     Rate and Rhythm: Normal rate and regular rhythm.     Pulses: Normal pulses.     Heart sounds: Normal heart sounds.  Pulmonary:     Effort: Pulmonary effort is normal.     Breath sounds: Normal breath sounds.  Abdominal:     Palpations: Abdomen is soft.  Musculoskeletal:        General: Normal range of motion.     Cervical back: Normal range of motion and neck supple.  Lymphadenopathy:     Cervical: No cervical adenopathy.  Skin:    General: Skin is warm and dry.     Capillary Refill: Capillary refill takes less than 2 seconds.  Neurological:     General: No focal deficit present.     Mental Status: She is alert and oriented to person, place, and time.  Psychiatric:        Mood and Affect: Mood normal.        Behavior: Behavior normal.        Thought Content: Thought content normal.        Judgment: Judgment  normal.      Assessment & Plan    Pre-diabetes  Acute migraine Assessment & Plan: Doing well on nurtec. Take as needed for acute migraine. May also take maxalt if needed. Refills to be provided PRN.    Benign essential tremor Assessment & Plan: Well  managed. Continue current medication.    Insulin resistance Assessment & Plan: Continue metformin as prescribed. Regular visits with [facility] as scheduled.       Return in about 6 months (around 08/26/2023) for health maintenance exam.         Ronnell Freshwater, NP  [place] [facility] [phone] (phone) [phone] (fax)  [place]

## 2023-03-01 ENCOUNTER — Other Ambulatory Visit: Payer: Self-pay | Admitting: Nurse Practitioner

## 2023-03-01 DIAGNOSIS — G43119 Migraine with aura, intractable, without status migrainosus: Secondary | ICD-10-CM

## 2023-03-10 ENCOUNTER — Ambulatory Visit (INDEPENDENT_AMBULATORY_CARE_PROVIDER_SITE_OTHER): Payer: 59 | Admitting: Family Medicine

## 2023-03-10 ENCOUNTER — Encounter (INDEPENDENT_AMBULATORY_CARE_PROVIDER_SITE_OTHER): Payer: Self-pay | Admitting: Family Medicine

## 2023-03-10 VITALS — BP 138/95 | HR 88 | Temp 98.2°F | Ht 64.0 in | Wt 263.0 lb

## 2023-03-10 DIAGNOSIS — Z6841 Body Mass Index (BMI) 40.0 and over, adult: Secondary | ICD-10-CM

## 2023-03-10 DIAGNOSIS — E559 Vitamin D deficiency, unspecified: Secondary | ICD-10-CM | POA: Diagnosis not present

## 2023-03-10 DIAGNOSIS — E669 Obesity, unspecified: Secondary | ICD-10-CM

## 2023-03-10 MED ORDER — VITAMIN D (ERGOCALCIFEROL) 1.25 MG (50000 UNIT) PO CAPS
50000.0000 [IU] | ORAL_CAPSULE | ORAL | 0 refills | Status: DC
Start: 2023-03-10 — End: 2023-05-05

## 2023-03-11 ENCOUNTER — Other Ambulatory Visit: Payer: Self-pay | Admitting: Neurology

## 2023-03-11 NOTE — Progress Notes (Signed)
Chief Complaint:   OBESITY Sarah Fields is here to discuss her progress with her obesity treatment plan along with follow-up of her obesity related diagnoses. Sarah Fields is on the Category 3 Plan and states she is following her eating plan approximately 70% of the time. Sarah Fields states she has been walking for exercise.   Today's visit was #: 41 Starting weight: 295 lbs Starting date: 12/13/2020 Today's weight: 263 lbs Today's date: 03/10/2023 Total lbs lost to date: 32 Total lbs lost since last in-office visit: 0  Interim History: Sarah Fields is working on increasing her water intake and increasing her protein but she is still struggling.  She was traveling and that can make following her meal plan more difficult.  Subjective:   1. Vitamin D deficiency Sarah Fields is stable on vitamin D, and she denies nausea, vomiting, or muscle weakness.  Her last vitamin D level was at goal.  Assessment/Plan:   1. Vitamin D deficiency Sarah Fields will continue prescription vitamin D, and we will refill for 1 month.  We will recheck labs in 1 month.  - Vitamin D, Ergocalciferol, (DRISDOL) 1.25 MG (50000 UNIT) CAPS capsule; Take 1 capsule (50,000 Units total) by mouth every 7 (seven) days.  Dispense: 4 capsule; Refill: 0  2. BMI 45.0-49.9, adult  3. Obesity, Beginning BMI 50.64 Sarah Fields is currently in the action stage of change. As such, her goal is to continue with weight loss efforts. She has agreed to the Category 3 Plan.   I discussed protein rich food ideas with the patient, and handout was given.  Exercise goals: As is.   Behavioral modification strategies: increasing lean protein intake.  Sarah Fields has agreed to follow-up with our clinic in 3 to 4 weeks. She was informed of the importance of frequent follow-up visits to maximize her success with intensive lifestyle modifications for her multiple health conditions.   Objective:   Blood pressure (!) 138/95, pulse 88, temperature 98.2 F (36.8  C), height  (1.626 m), weight 263 lb (119.3 kg), SpO2 97 %. Body mass index is 45.14 kg/m.  Lab Results  Component Value Date   CREATININE 0.92 10/23/2022   BUN 13 10/23/2022   NA 138 10/23/2022   K 4.9 10/23/2022   CL 101 10/23/2022   CO2 23 10/23/2022   Lab Results  Component Value Date   ALT 7 10/23/2022   AST 15 10/23/2022   ALKPHOS 92 10/23/2022   BILITOT 0.3 10/23/2022   Lab Results  Component Value Date   HGBA1C 5.3 10/23/2022   HGBA1C 5.2 06/19/2022   HGBA1C 5.4 02/11/2022   HGBA1C 5.5 07/31/2021   HGBA1C 5.4 12/13/2020   Lab Results  Component Value Date   INSULIN 13.2 10/23/2022   INSULIN 12.0 06/19/2022   INSULIN 17.3 02/11/2022   INSULIN 19.6 07/31/2021   INSULIN 21.4 12/13/2020   Lab Results  Component Value Date   TSH 4.580 (H) 12/25/2022   Lab Results  Component Value Date   CHOL 156 10/23/2022   HDL 38 (L) 10/23/2022   LDLCALC 88 10/23/2022   TRIG 175 (H) 10/23/2022   CHOLHDL 4.5 05/30/2015   Lab Results  Component Value Date   VD25OH 48.0 10/23/2022   VD25OH 53.6 06/19/2022   VD25OH 64.7 02/11/2022   Lab Results  Component Value Date   WBC 9.9 09/07/2021   HGB 14.2 09/07/2021   HCT 42.8 09/07/2021   MCV 93.4 09/07/2021   PLT 276 09/07/2021   No results found for: "IRON", "TIBC", "  FERRITIN"  Attestation Statements:   Reviewed by clinician on day of visit: allergies, medications, problem list, medical history, surgical history, family history, social history, and previous encounter notes.   I, Burt Knack, am acting as transcriptionist for Quillian Quince, MD.  I have reviewed the above documentation for accuracy and completeness, and I agree with the above. -  Quillian Quince, MD

## 2023-03-11 NOTE — Telephone Encounter (Signed)
LVM for patient to call back to discuss refill request Primidone

## 2023-03-17 ENCOUNTER — Telehealth: Payer: Self-pay

## 2023-03-17 NOTE — Telephone Encounter (Signed)
Sent patient message about refusing refill for primidone as Dr. Lucia Gaskins last notes states she wants PCP to follow for future refills.

## 2023-04-07 ENCOUNTER — Encounter (INDEPENDENT_AMBULATORY_CARE_PROVIDER_SITE_OTHER): Payer: Self-pay | Admitting: Family Medicine

## 2023-04-07 ENCOUNTER — Ambulatory Visit (INDEPENDENT_AMBULATORY_CARE_PROVIDER_SITE_OTHER): Payer: 59 | Admitting: Family Medicine

## 2023-04-07 ENCOUNTER — Telehealth (INDEPENDENT_AMBULATORY_CARE_PROVIDER_SITE_OTHER): Payer: Self-pay | Admitting: Family Medicine

## 2023-04-07 VITALS — BP 133/91 | HR 89 | Temp 98.3°F | Ht 64.0 in | Wt 260.0 lb

## 2023-04-07 DIAGNOSIS — E782 Mixed hyperlipidemia: Secondary | ICD-10-CM | POA: Diagnosis not present

## 2023-04-07 DIAGNOSIS — E669 Obesity, unspecified: Secondary | ICD-10-CM

## 2023-04-07 DIAGNOSIS — E559 Vitamin D deficiency, unspecified: Secondary | ICD-10-CM

## 2023-04-07 DIAGNOSIS — R030 Elevated blood-pressure reading, without diagnosis of hypertension: Secondary | ICD-10-CM

## 2023-04-07 DIAGNOSIS — R7303 Prediabetes: Secondary | ICD-10-CM

## 2023-04-07 DIAGNOSIS — Z6841 Body Mass Index (BMI) 40.0 and over, adult: Secondary | ICD-10-CM

## 2023-04-07 DIAGNOSIS — F3289 Other specified depressive episodes: Secondary | ICD-10-CM

## 2023-04-07 DIAGNOSIS — R4689 Other symptoms and signs involving appearance and behavior: Secondary | ICD-10-CM

## 2023-04-07 MED ORDER — BUPROPION HCL ER (SR) 150 MG PO TB12: 150.0000 mg | ORAL_TABLET | Freq: Two times a day (BID) | ORAL | 1 refills | Status: DC

## 2023-04-07 MED ORDER — METFORMIN HCL 500 MG PO TABS: 500.0000 mg | ORAL_TABLET | Freq: Two times a day (BID) | ORAL | 1 refills | Status: DC

## 2023-04-07 NOTE — Telephone Encounter (Signed)
[facility] called 04/07/23  have questions on the direction for the Metformin

## 2023-04-08 LAB — LIPID PANEL WITH LDL/HDL RATIO
Cholesterol, Total: 156 mg/dL (ref 100–199)
HDL: 41 mg/dL (ref >39)
LDL Chol Calc (NIH): 83 mg/dL (ref 0–99)
LDL/HDL Ratio: 2 ratio (ref 0.0–3.2)
Triglycerides: 191 mg/dL — ABNORMAL HIGH (ref 0–149)
VLDL Cholesterol Cal: 32 mg/dL (ref 5–40)

## 2023-04-08 LAB — CMP14+EGFR
ALT: 10 IU/L (ref 0–32)
AST: 14 IU/L (ref 0–40)
Albumin/Globulin Ratio: 2 (ref 1.2–2.2)
Albumin: 4.1 g/dL (ref 3.9–4.9)
Alkaline Phosphatase: 88 IU/L (ref 44–121)
BUN/Creatinine Ratio: 10 (ref 9–23)
BUN: 10 mg/dL (ref 6–24)
Bilirubin Total: 0.4 mg/dL (ref 0.0–1.2)
CO2: 24 mmol/L (ref 20–29)
Calcium: 9.2 mg/dL (ref 8.7–10.2)
Chloride: 101 mmol/L (ref 96–106)
Creatinine, Ser: 1.03 mg/dL — ABNORMAL HIGH (ref 0.57–1.00)
Globulin, Total: 2.1 g/dL (ref 1.5–4.5)
Glucose: 80 mg/dL (ref 70–99)
Potassium: 4.7 mmol/L (ref 3.5–5.2)
Sodium: 139 mmol/L (ref 134–144)
Total Protein: 6.2 g/dL (ref 6.0–8.5)
eGFR: 67 mL/min/{1.73_m2} (ref >59)

## 2023-04-08 LAB — HEMOGLOBIN A1C
Est. average glucose Bld gHb Est-mCnc: 111 mg/dL
Hgb A1c MFr Bld: 5.5 % (ref 4.8–5.6)

## 2023-04-08 LAB — VITAMIN D 25 HYDROXY (VIT D DEFICIENCY, FRACTURES): Vit D, 25-Hydroxy: 69.2 ng/mL (ref 30.0–100.0)

## 2023-04-08 LAB — INSULIN, RANDOM: INSULIN: 16.2 u[IU]/mL (ref 2.6–24.9)

## 2023-04-08 NOTE — Progress Notes (Signed)
Chief Complaint:   OBESITY Sarah Fields is here to discuss her progress with her obesity treatment plan along with follow-up of her obesity related diagnoses. Sarah Fields is on the Category 3 Plan and states she is following her eating plan approximately 50-60% of the time. Sarah Fields states she is doing 0 minutes 0 times per week.  Today's visit was #: 42 Starting weight: 295 lbs Starting date: 12/13/2020 Today's weight: 260 lbs Today's date: 04/07/2023 Total lbs lost to date: 35 Total lbs lost since last in-office visit: 3  Interim History: Sarah Fields continues to do well with her weight loss even when on steroids.  Her hunger is mostly appropriate.  She did a lot of walking while traveling and her ankle is now sore (history of arthritis).   Subjective:   1. Mixed hyperlipidemia Sarah Fields has a history of low HDL and elevated triglycerides.  She is working on her diet and weight loss.  2. Pre-diabetes Sarah Fields is doing well on metformin and with her diet.  She is due for labs.  3. Vitamin D deficiency Sarah Fields is on vitamin D, and she is due for labs.  She has no signs of over replacement.  4. Elevated blood pressure reading in office without diagnosis of hypertension Sarah Fields has had 3 elevated diastolic readings in the last 6 months.  She is not on antihypertensives.  She is doing well with her diet and weight loss.  5. Emotional Eating Behavior Sarah Fields is working on [employer] behavior, and she did well even while on prednisone.  Assessment/Plan:   1. Mixed hyperlipidemia We will check labs today, and Sarah Fields will continue with her diet.  - Lipid Panel With LDL/HDL Ratio  2. Pre-diabetes We will check labs today.  Sarah Fields will continue metformin, and we will refill for 2 months.  - metFORMIN (GLUCOPHAGE) 500 MG tablet; Take 1 tablet (500 mg total) by mouth 2 (two) times daily with a meal. TAKE 1 TABLET BY MOUTH EVERY DAY WITH BREAKFAST  Dispense: 60  tablet; Refill: 1 - CMP14+EGFR - Insulin, random - Hemoglobin A1c  3. Vitamin D deficiency We will check labs today, and we will follow-up at Sarah Fields's next visit.  - VITAMIN D 25 Hydroxy (Vit-D Deficiency, Fractures)  4. Elevated blood pressure reading in office without diagnosis of hypertension Sarah Fields is to discuss with her PCP, and she will continue to work on her weight loss to treat her hypertension.  5. Emotional Eating Behavior Sarah Fields will continue Wellbutrin SR, and we will refill for 2 months.  - buPROPion (WELLBUTRIN SR) 150 MG 12 hr tablet; Take 1 tablet (150 mg total) by mouth 2 (two) times daily.  Dispense: 60 tablet; Refill: 1  6. BMI 40.0-44.9, adult (HCC)  7. Obesity, Beginning BMI 50.64 Sarah Fields is currently in the action stage of change. As such, her goal is to continue with weight loss efforts. She has agreed to the Category 3 Plan.   Exercise goals: As tolerated.   Behavioral modification strategies: increasing lean protein intake.  Sarah Fields has agreed to follow-up with our clinic in 4 weeks. She was informed of the importance of frequent follow-up visits to maximize her success with intensive lifestyle modifications for her multiple health conditions.   Sarah Fields was informed we would discuss her lab results at her next visit unless there is a critical issue that needs to be addressed sooner. Sarah Fields agreed to keep her next visit at the agreed upon time to discuss these results.  Objective:  Blood pressure (!) 133/91, pulse 89, temperature 98.3 F (36.8 C), height 5\' 4"  (1.626 m), weight 260 lb (117.9 kg), SpO2 98 %. Body mass index is 44.63 kg/m.  Lab Results  Component Value Date   CREATININE 1.03 (H) 04/07/2023   BUN 10 04/07/2023   NA 139 04/07/2023   K 4.7 04/07/2023   CL 101 04/07/2023   CO2 24 04/07/2023   Lab Results  Component Value Date   ALT 10 04/07/2023   AST 14 04/07/2023   ALKPHOS 88 04/07/2023   BILITOT 0.4 04/07/2023    Lab Results  Component Value Date   HGBA1C 5.5 04/07/2023   HGBA1C 5.3 10/23/2022   HGBA1C 5.2 06/19/2022   HGBA1C 5.4 02/11/2022   HGBA1C 5.5 07/31/2021   Lab Results  Component Value Date   INSULIN 16.2 04/07/2023   INSULIN 13.2 10/23/2022   INSULIN 12.0 06/19/2022   INSULIN 17.3 02/11/2022   INSULIN 19.6 07/31/2021   Lab Results  Component Value Date   TSH 4.580 (H) 12/25/2022   Lab Results  Component Value Date   CHOL 156 04/07/2023   HDL 41 04/07/2023   LDLCALC 83 04/07/2023   TRIG 191 (H) 04/07/2023   CHOLHDL 4.5 05/30/2015   Lab Results  Component Value Date   VD25OH 69.2 04/07/2023   VD25OH 48.0 10/23/2022   VD25OH 53.6 06/19/2022   Lab Results  Component Value Date   WBC 9.9 09/07/2021   HGB 14.2 09/07/2021   HCT 42.8 09/07/2021   MCV 93.4 09/07/2021   PLT 276 09/07/2021   No results found for: "IRON", "TIBC", "FERRITIN"  Attestation Statements:   Reviewed by clinician on day of visit: allergies, medications, problem list, medical history, surgical history, family history, social history, and previous encounter notes.  I have personally spent 40 minutes total time today in preparation, patient care, and documentation for this visit, including the following: review of clinical lab tests; review of medical tests/procedures/services.  I, Burt Knack, am acting as transcriptionist for Quillian Quince, MD.  I have reviewed the above documentation for accuracy and completeness, and I agree with the above. -  Quillian Quince, MD

## 2023-04-09 ENCOUNTER — Encounter (INDEPENDENT_AMBULATORY_CARE_PROVIDER_SITE_OTHER): Payer: Self-pay | Admitting: Family Medicine

## 2023-04-09 ENCOUNTER — Encounter (INDEPENDENT_AMBULATORY_CARE_PROVIDER_SITE_OTHER): Payer: Self-pay

## 2023-04-09 DIAGNOSIS — R7303 Prediabetes: Secondary | ICD-10-CM

## 2023-04-09 NOTE — Telephone Encounter (Signed)
I called patient to ask her how has she been taking the Metformin, as the Rx directions have been the same for several months now.  Phone went to VM, so I will send her a Mychart message.

## 2023-04-27 ENCOUNTER — Encounter: Payer: Self-pay | Admitting: Nurse Practitioner

## 2023-04-28 ENCOUNTER — Other Ambulatory Visit: Payer: Self-pay | Admitting: Nurse Practitioner

## 2023-04-28 DIAGNOSIS — G43909 Migraine, unspecified, not intractable, without status migrainosus: Secondary | ICD-10-CM

## 2023-04-28 MED ORDER — NURTEC 75 MG PO TBDP
ORAL_TABLET | ORAL | 3 refills | Status: DC
Start: 2023-04-28 — End: 2023-08-31

## 2023-05-05 ENCOUNTER — Ambulatory Visit (INDEPENDENT_AMBULATORY_CARE_PROVIDER_SITE_OTHER): Payer: 59 | Admitting: Family Medicine

## 2023-05-05 ENCOUNTER — Encounter (INDEPENDENT_AMBULATORY_CARE_PROVIDER_SITE_OTHER): Payer: Self-pay | Admitting: Family Medicine

## 2023-05-05 VITALS — BP 124/83 | HR 92 | Temp 98.8°F | Ht 64.0 in | Wt 262.0 lb

## 2023-05-05 DIAGNOSIS — Z6841 Body Mass Index (BMI) 40.0 and over, adult: Secondary | ICD-10-CM

## 2023-05-05 DIAGNOSIS — R7303 Prediabetes: Secondary | ICD-10-CM

## 2023-05-05 DIAGNOSIS — E559 Vitamin D deficiency, unspecified: Secondary | ICD-10-CM

## 2023-05-05 DIAGNOSIS — F3289 Other specified depressive episodes: Secondary | ICD-10-CM | POA: Diagnosis not present

## 2023-05-05 DIAGNOSIS — E669 Obesity, unspecified: Secondary | ICD-10-CM | POA: Diagnosis not present

## 2023-05-05 MED ORDER — VITAMIN D (ERGOCALCIFEROL) 1.25 MG (50000 UNIT) PO CAPS: 50000.0000 [IU] | ORAL_CAPSULE | ORAL | 0 refills | Status: DC

## 2023-05-05 MED ORDER — METFORMIN HCL 500 MG PO TABS
500.00 mg | ORAL_TABLET | Freq: Two times a day (BID) | ORAL | 1 refills | Status: DC
Start: 2023-05-05 — End: 2023-06-02

## 2023-05-05 MED ORDER — BUPROPION HCL ER (SR) 150 MG PO TB12
150.0000 mg | ORAL_TABLET | Freq: Two times a day (BID) | ORAL | 1 refills | Status: DC
Start: 2023-05-05 — End: 2023-06-02

## 2023-05-05 NOTE — Progress Notes (Signed)
.[facility]  Office: [phone]  /  Fax: [phone]  WEIGHT SUMMARY AND BIOMETRICS  Anthropometric Measurements Height: 5\' 4"  (1.626 m) Weight: 262 lb (118.8 kg) BMI (Calculated): 44.95 Weight at Last Visit: 260lb Weight Lost Since Last Visit: 0lb Weight Gained Since Last Visit: 2lb   Body Composition  Body Fat %: 50.6 % Fat Mass (lbs): 132.8 lbs Muscle Mass (lbs): 123 lbs Total Body Water (lbs): 94.6 lbs Visceral Fat Rating : 16   Other Clinical Data Fasting: no Labs: no Today's Visit #: 43  Discussed the use of AI scribe software for clinical note transcription with the patient, who gave verbal consent to proceed.  Chief Complaint: OBESITY  Sarah Fields is here to discuss her progress with her obesity treatment plan. She is on the the Category 3 Plan and states she is following her eating plan approximately 70 % of the time. She states she is exercising 0 minutes 0 times per week.   History of Present Illness   [place], a 47 year old individual with a history of obesity, vitamin D deficiency, prediabetes, and emotional eating behaviors, presents today for a routine follow-up. She reports a recent increase in stress due to changes in her work insurance, which required her to navigate complex insurance options within a short timeframe. This stress has significantly impacted her eating behaviors. Initially, the acute stress led to a decrease in appetite, with difficulty consuming even half a sandwich. However, as the stress became more chronic, she noticed an increase in emotional eating, reaching for "anything and everything."  Despite these challenges, she has been adhering to her prescribed medications, including Bupropion twice daily, prescription vitamin D once a week, and Metformin for prediabetes. She reports occasional constipation from Metformin, which is managed with Miralax. She has also been following a category three eating plan, focusing on protein and vegetables, with  some difficulty due to the recent stress.  In terms of weight management, she has gained two pounds, which she attributes to the recent stress and emotional eating. She has been trying to maintain hydration to manage fluid retention associated with consumption of simple carbohydrates. She is considering changes to her eating plan but is unsure at this time.  Regarding her prediabetes, she reports no significant changes in symptoms. Her recent labs showed a slight increase in A1C to 5.5, still within the acceptable range, and an increase in insulin to 16. She is aware of the need to keep these levels under control to manage her prediabetes effectively.  In summary, this individual is dealing with increased stress due to changes in her work insurance, which has negatively impacted her eating behaviors and weight management. She continues to manage her prediabetes, vitamin D deficiency, and obesity with medication and dietary changes. She is considering adjustments to her eating plan to better manage her health conditions.          PHYSICAL EXAM:  Blood pressure 124/83, pulse 92, temperature 98.8 F (37.1 C), height 5\' 4"  (1.626 m), weight 262 lb (118.8 kg), SpO2 99 %. Body mass index is 44.97 kg/m.  DIAGNOSTIC DATA REVIEWED:  BMET    Component Value Date/Time   NA 139 04/07/2023 0856   K 4.7 04/07/2023 0856   CL 101 04/07/2023 0856   CO2 24 04/07/2023 0856   GLUCOSE 80 04/07/2023 0856   GLUCOSE 88 09/07/2021 1105   BUN 10 04/07/2023 0856   CREATININE 1.03 (H) 04/07/2023 0856   CREATININE 0.90 08/30/2019 1143   CREATININE 0.79 05/30/2015 2956  CALCIUM 9.2 04/07/2023 0856   GFRNONAA >60 09/07/2021 1105   GFRNONAA >60 08/30/2019 1143   GFRAA 80 12/13/2020 0942   GFRAA >60 08/30/2019 1143   Lab Results  Component Value Date   HGBA1C 5.5 04/07/2023   HGBA1C 5.5 05/30/2015   Lab Results  Component Value Date   INSULIN 16.2 04/07/2023   INSULIN 21.4 12/13/2020   Lab Results   Component Value Date   TSH 4.580 (H) 12/25/2022   CBC    Component Value Date/Time   WBC 9.9 09/07/2021 1105   RBC 4.58 09/07/2021 1105   HGB 14.2 09/07/2021 1105   HGB 13.6 12/13/2020 0942   HGB 12.0 05/30/2015 0856   HCT 42.8 09/07/2021 1105   HCT 42.1 12/13/2020 0942   PLT 276 09/07/2021 1105   PLT 331 12/13/2020 0942   MCV 93.4 09/07/2021 1105   MCV 92 12/13/2020 0942   MCH 31.0 09/07/2021 1105   MCHC 33.2 09/07/2021 1105   RDW 12.5 09/07/2021 1105   RDW 12.4 12/13/2020 0942   Iron Studies No results found for: "IRON", "TIBC", "FERRITIN", "IRONPCTSAT" Lipid Panel     Component Value Date/Time   CHOL 156 04/07/2023 0856   TRIG 191 (H) 04/07/2023 0856   HDL 41 04/07/2023 0856   CHOLHDL 4.5 05/30/2015 0922   VLDL 59 (H) 05/30/2015 0922   LDLCALC 83 04/07/2023 0856   Hepatic Function Panel     Component Value Date/Time   PROT 6.2 04/07/2023 0856   ALBUMIN 4.1 04/07/2023 0856   AST 14 04/07/2023 0856   AST 14 (L) 08/30/2019 1143   ALT 10 04/07/2023 0856   ALT 12 08/30/2019 1143   ALKPHOS 88 04/07/2023 0856   BILITOT 0.4 04/07/2023 0856   BILITOT 0.3 08/30/2019 1143      Component Value Date/Time   TSH 4.580 (H) 12/25/2022 0837   Nutritional Lab Results  Component Value Date   VD25OH 69.2 04/07/2023   VD25OH 48.0 10/23/2022   VD25OH 53.6 06/19/2022     Assessment and Plan    Obesity and Emotional Eating: Stressful life events leading to emotional eating and slight weight gain. Discussed the importance of maintaining a balanced diet focusing on protein and vegetables. -Continue with category three eating plan, focusing on protein and vegetables. -Consider melon as a fruit option, mindful of portion size.  Vitamin D Deficiency: Well controlled with prescription Vitamin D. -Continue Vitamin D prescription once a week.  Prediabetes: Slight increase in A1C and insulin levels, but still within acceptable range. Triglycerides slightly elevated, likely due  to increased carbohydrate intake. -Continue Metformin and monitor for any stomach upset. -Encourage hydration to help manage triglyceride levels. -Check labs at next visit to monitor A1C, insulin, and triglyceride levels.  Insurance-related Stress: Significant stress related to changes in insurance coverage. Discussed potential impact on healthcare access and medication coverage. -Continue Bupropion twice daily for emotional support. -Switch to [facility] pharmacy for medication fulfillment. -Follow-up at next scheduled appointment to reassess emotional state and discuss potential changes to eating plan.         I have personally spent 43 minutes total time today in preparation, patient care, and documentation for this visit, including the following: review of clinical lab tests; review of medical tests/procedures/services.    She was informed of the importance of frequent follow up visits to maximize her success with intensive lifestyle modifications for her multiple health conditions. Return in about 4 weeks (around 06/02/2023).   Quillian Quince, MD

## 2023-05-06 ENCOUNTER — Other Ambulatory Visit (INDEPENDENT_AMBULATORY_CARE_PROVIDER_SITE_OTHER): Payer: Self-pay | Admitting: Family Medicine

## 2023-05-06 DIAGNOSIS — R7303 Prediabetes: Secondary | ICD-10-CM

## 2023-05-10 ENCOUNTER — Other Ambulatory Visit (INDEPENDENT_AMBULATORY_CARE_PROVIDER_SITE_OTHER): Payer: Self-pay | Admitting: Family Medicine

## 2023-05-10 DIAGNOSIS — E559 Vitamin D deficiency, unspecified: Secondary | ICD-10-CM

## 2023-06-02 ENCOUNTER — Ambulatory Visit (INDEPENDENT_AMBULATORY_CARE_PROVIDER_SITE_OTHER): Payer: Commercial Managed Care - HMO | Admitting: Family Medicine

## 2023-06-02 ENCOUNTER — Encounter (INDEPENDENT_AMBULATORY_CARE_PROVIDER_SITE_OTHER): Payer: Self-pay | Admitting: Family Medicine

## 2023-06-02 VITALS — BP 132/77 | HR 87 | Temp 98.3°F | Ht 64.0 in | Wt 260.0 lb

## 2023-06-02 DIAGNOSIS — Z6841 Body Mass Index (BMI) 40.0 and over, adult: Secondary | ICD-10-CM

## 2023-06-02 DIAGNOSIS — E669 Obesity, unspecified: Secondary | ICD-10-CM | POA: Diagnosis not present

## 2023-06-02 DIAGNOSIS — E559 Vitamin D deficiency, unspecified: Secondary | ICD-10-CM | POA: Diagnosis not present

## 2023-06-02 DIAGNOSIS — R7303 Prediabetes: Secondary | ICD-10-CM | POA: Diagnosis not present

## 2023-06-02 DIAGNOSIS — F3289 Other specified depressive episodes: Secondary | ICD-10-CM | POA: Diagnosis not present

## 2023-06-02 MED ORDER — BUPROPION HCL ER (SR) 150 MG PO TB12
150.0000 mg | ORAL_TABLET | Freq: Two times a day (BID) | ORAL | 1 refills | Status: DC
Start: 2023-06-02 — End: 2023-07-29

## 2023-06-02 MED ORDER — VITAMIN D (ERGOCALCIFEROL) 1.25 MG (50000 UNIT) PO CAPS: 50000.0000 [IU] | ORAL_CAPSULE | ORAL | 0 refills | Status: DC

## 2023-06-02 MED ORDER — METFORMIN HCL 500 MG PO TABS: 500.0000 mg | ORAL_TABLET | Freq: Two times a day (BID) | ORAL | 1 refills | Status: DC

## 2023-06-02 NOTE — Progress Notes (Signed)
Chief Complaint:   OBESITY Sarah Fields is here to discuss her progress with her obesity treatment plan along with follow-up of her obesity related diagnoses. Sarah Fields is on the Category 3 Plan and states she is following her eating plan approximately 75% of the time. Sarah Fields states she is doing 0 minutes 0 times per week.  Today's visit was #: 44 Starting weight: 295 lbs Starting date: 12/13/2020 Today's weight: 260 lbs Today's date: 06/02/2023 Total lbs lost to date: 35 Total lbs lost since last in-office visit: 2  Interim History: Patient has been doing well with her diet and being mindful of her food choices. She is working on increasing her protein and she feels this is helping her with her weight loss.   Subjective:   1. Pre-diabetes Patient is doing well on metformin and her diet. She denies hypoglycemia.   2. Vitamin D deficiency Patient's recent Vitamin D level was at goal, with no signs of over-replacement.   3. Emotional Eating Behavior Patient is stable on her medications, and her blood pressure is at goal. She notes occasional xerostomia. Her sleep has been a bit more restless lately.    Assessment/Plan:   1. Pre-diabetes Patient will continue metformin 500 mg BID, and we will refill for 2 months.   - metFORMIN (GLUCOPHAGE) 500 MG tablet; Take 1 tablet (500 mg total) by mouth 2 (two) times daily with a meal. TAKE 1 TABLET BY MOUTH EVERY DAY WITH BREAKFAST  Dispense: 60 tablet; Refill: 1  2. Vitamin D deficiency Patient will continue prescription Vitamin D once weekly, and we will refill for 1 month.   - Vitamin D, Ergocalciferol, (DRISDOL) 1.25 MG (50000 UNIT) CAPS capsule; Take 1 capsule (50,000 Units total) by mouth every 7 (seven) days.  Dispense: 4 capsule; Refill: 0  3. Emotional Eating Behavior Patient will continue Wellbutrin SR 150 mg BID, and we will refill for 2 months; will watch her side effects which should resolve quickly as she has been on this  medication for a while.   - buPROPion (WELLBUTRIN SR) 150 MG 12 hr tablet; Take 1 tablet (150 mg total) by mouth 2 (two) times daily.  Dispense: 60 tablet; Refill: 1  4. BMI 40.0-44.9, adult (HCC)  5. Obesity, Beginning BMI 50.64 Sarah Fields is currently in the action stage of change. As such, her goal is to continue with weight loss efforts. She has agreed to the Category 3 Plan.   Behavioral modification strategies: increasing lean protein intake and increasing water intake.  Sarah Fields has agreed to follow-up with our clinic in 4 weeks. She was informed of the importance of frequent follow-up visits to maximize her success with intensive lifestyle modifications for her multiple health conditions.   Objective:   Blood pressure 132/77, pulse 87, temperature 98.3 F (36.8 C), height 5\' 4"  (1.626 m), weight 260 lb (117.9 kg), SpO2 98 %. Body mass index is 44.63 kg/m.  Lab Results  Component Value Date   CREATININE 1.03 (H) 04/07/2023   BUN 10 04/07/2023   NA 139 04/07/2023   K 4.7 04/07/2023   CL 101 04/07/2023   CO2 24 04/07/2023   Lab Results  Component Value Date   ALT 10 04/07/2023   AST 14 04/07/2023   ALKPHOS 88 04/07/2023   BILITOT 0.4 04/07/2023   Lab Results  Component Value Date   HGBA1C 5.5 04/07/2023   HGBA1C 5.3 10/23/2022   HGBA1C 5.2 06/19/2022   HGBA1C 5.4 02/11/2022   HGBA1C 5.5 07/31/2021  Lab Results  Component Value Date   INSULIN 16.2 04/07/2023   INSULIN 13.2 10/23/2022   INSULIN 12.0 06/19/2022   INSULIN 17.3 02/11/2022   INSULIN 19.6 07/31/2021   Lab Results  Component Value Date   TSH 4.580 (H) 12/25/2022   Lab Results  Component Value Date   CHOL 156 04/07/2023   HDL 41 04/07/2023   LDLCALC 83 04/07/2023   TRIG 191 (H) 04/07/2023   CHOLHDL 4.5 05/30/2015   Lab Results  Component Value Date   VD25OH 69.2 04/07/2023   VD25OH 48.0 10/23/2022   VD25OH 53.6 06/19/2022   Lab Results  Component Value Date   WBC 9.9 09/07/2021    HGB 14.2 09/07/2021   HCT 42.8 09/07/2021   MCV 93.4 09/07/2021   PLT 276 09/07/2021   No results found for: "IRON", "TIBC", "FERRITIN"  Attestation Statements:   Reviewed by clinician on day of visit: allergies, medications, problem list, medical history, surgical history, family history, social history, and previous encounter notes.   I, Burt Knack, am acting as transcriptionist for Quillian Quince, MD.  I have reviewed the above documentation for accuracy and completeness, and I agree with the above. -  Quillian Quince, MD

## 2023-06-03 ENCOUNTER — Other Ambulatory Visit (INDEPENDENT_AMBULATORY_CARE_PROVIDER_SITE_OTHER): Payer: Self-pay | Admitting: Family Medicine

## 2023-06-03 DIAGNOSIS — R7303 Prediabetes: Secondary | ICD-10-CM

## 2023-06-04 NOTE — Telephone Encounter (Signed)
1 tablet PO BID with meals

## 2023-06-09 NOTE — Telephone Encounter (Signed)
This came back to me, any problems?

## 2023-06-09 NOTE — Telephone Encounter (Signed)
No problems, pharmacy needed clarification so I sent that back to them.

## 2023-06-11 ENCOUNTER — Other Ambulatory Visit (INDEPENDENT_AMBULATORY_CARE_PROVIDER_SITE_OTHER): Payer: Self-pay | Admitting: Family Medicine

## 2023-06-11 DIAGNOSIS — E559 Vitamin D deficiency, unspecified: Secondary | ICD-10-CM

## 2023-06-27 ENCOUNTER — Encounter

## 2023-06-30 ENCOUNTER — Ambulatory Visit (INDEPENDENT_AMBULATORY_CARE_PROVIDER_SITE_OTHER): Payer: 59 | Admitting: Family Medicine

## 2023-07-29 ENCOUNTER — Ambulatory Visit (INDEPENDENT_AMBULATORY_CARE_PROVIDER_SITE_OTHER): Payer: Commercial Managed Care - HMO | Admitting: Family Medicine

## 2023-07-29 ENCOUNTER — Encounter (INDEPENDENT_AMBULATORY_CARE_PROVIDER_SITE_OTHER): Payer: Self-pay | Admitting: Family Medicine

## 2023-07-29 VITALS — BP 138/89 | HR 79 | Temp 98.6°F | Ht 64.0 in | Wt 253.0 lb

## 2023-07-29 DIAGNOSIS — E559 Vitamin D deficiency, unspecified: Secondary | ICD-10-CM | POA: Diagnosis not present

## 2023-07-29 DIAGNOSIS — Z6841 Body Mass Index (BMI) 40.0 and over, adult: Secondary | ICD-10-CM

## 2023-07-29 DIAGNOSIS — F3289 Other specified depressive episodes: Secondary | ICD-10-CM

## 2023-07-29 DIAGNOSIS — R7303 Prediabetes: Secondary | ICD-10-CM | POA: Diagnosis not present

## 2023-07-29 DIAGNOSIS — E669 Obesity, unspecified: Secondary | ICD-10-CM

## 2023-07-29 MED ORDER — METFORMIN HCL 500 MG PO TABS
ORAL_TABLET | ORAL | 1 refills | Status: DC
Start: 2023-07-29 — End: 2023-08-03

## 2023-07-29 MED ORDER — BUPROPION HCL ER (SR) 150 MG PO TB12: 150.0000 mg | ORAL_TABLET | Freq: Two times a day (BID) | ORAL | 1 refills | Status: DC

## 2023-07-29 MED ORDER — VITAMIN D (ERGOCALCIFEROL) 1.25 MG (50000 UNIT) PO CAPS
50000.0000 [IU] | ORAL_CAPSULE | ORAL | 0 refills | Status: DC
Start: 2023-07-29 — End: 2023-08-03

## 2023-07-29 NOTE — Progress Notes (Signed)
Chief Complaint:   OBESITY Sarah Fields is here to discuss her progress with her obesity treatment plan along with follow-up of her obesity related diagnoses. [name] is on the Category 3 Plan and states she is following her eating plan approximately 50% of the time. [name] states she is doing 0 minutes 0 times per week.  Today's visit was #: 45 Starting weight: 295 lbs Starting date: 12/13/2020 Today's weight: 253 lbs Today's date: 07/29/2023 Total lbs lost to date: 42 Total lbs lost since last in-office visit: 7  Interim History: Patient has been sick recently and has been unable to eat properly.  She was also on prednisone.  She will be traveling more.  Subjective:   1. Pre-diabetes Patient is working on her diet and weight loss.  She has done well overall with metformin, with no side effects noted.  2. Vitamin D deficiency Patient is on vitamin D prescription and she notes fatigue, but this is likely related to her recent illness.  3. Emotional Eating Behavior Patient is on Wellbutrin, and she is working on decreasing emotional eating behavior.  No side effects were noted.  She missed some doses while sick.  Assessment/Plan:   1. Pre-diabetes Patient will continue with her diet and metformin, and we will refill metformin for 2 months.  - metFORMIN (GLUCOPHAGE) 500 MG tablet; Take 1 tablet PO bid with meals  Dispense: 60 tablet; Refill: 1  2. Vitamin D deficiency Patient will continue prescription vitamin D once weekly, and we will refill for 1 month.  We will continue to follow closely.  - Vitamin D, Ergocalciferol, (DRISDOL) 1.25 MG (50000 UNIT) CAPS capsule; Take 1 capsule (50,000 Units total) by mouth every 7 (seven) days.  Dispense: 4 capsule; Refill: 0  3. Emotional Eating Behavior Patient will continue Wellbutrin SR 150 mg twice daily, and we will refill for 2 months.  - buPROPion (WELLBUTRIN SR) 150 MG 12 hr tablet; Take 1 tablet (150 mg total) by mouth 2 (two)  times daily.  Dispense: 60 tablet; Refill: 1  4. BMI 40.0-44.9, adult (HCC)  5. Obesity with starting BMI of 50.64 [name] is currently in the action stage of change. As such, her goal is to continue with weight loss efforts. She has agreed to the Category 3 Plan.   Exercise goals: As tolerated.   Behavioral modification strategies: increasing lean protein intake.  Sarah Fields has agreed to follow-up with our clinic in 4 weeks. She was informed of the importance of frequent follow-up visits to maximize her success with intensive lifestyle modifications for her multiple health conditions.   Objective:   Blood pressure 138/89, pulse 79, temperature 98.6 F (37 C), height 5\' 4"  (1.626 m), weight 253 lb (114.8 kg), SpO2 99%. Body mass index is 43.43 kg/m.  Lab Results  Component Value Date   CREATININE 1.03 (H) 04/07/2023   BUN 10 04/07/2023   NA 139 04/07/2023   K 4.7 04/07/2023   CL 101 04/07/2023   CO2 24 04/07/2023   Lab Results  Component Value Date   ALT 10 04/07/2023   AST 14 04/07/2023   ALKPHOS 88 04/07/2023   BILITOT 0.4 04/07/2023   Lab Results  Component Value Date   HGBA1C 5.5 04/07/2023   HGBA1C 5.3 10/23/2022   HGBA1C 5.2 06/19/2022   HGBA1C 5.4 02/11/2022   HGBA1C 5.5 07/31/2021   Lab Results  Component Value Date   INSULIN 16.2 04/07/2023   INSULIN 13.2 10/23/2022   INSULIN 12.0 06/19/2022  INSULIN 17.3 02/11/2022   INSULIN 19.6 07/31/2021   Lab Results  Component Value Date   TSH 4.580 (H) 12/25/2022   Lab Results  Component Value Date   CHOL 156 04/07/2023   HDL 41 04/07/2023   LDLCALC 83 04/07/2023   TRIG 191 (H) 04/07/2023   CHOLHDL 4.5 05/30/2015   Lab Results  Component Value Date   VD25OH 69.2 04/07/2023   VD25OH 48.0 10/23/2022   VD25OH 53.6 06/19/2022   Lab Results  Component Value Date   WBC 9.9 09/07/2021   HGB 14.2 09/07/2021   HCT 42.8 09/07/2021   MCV 93.4 09/07/2021   PLT 276 09/07/2021   No results found for:  "IRON", "TIBC", "FERRITIN"  Attestation Statements:   Reviewed by clinician on day of visit: allergies, medications, problem list, medical history, surgical history, family history, social history, and previous encounter notes.   I, Burt Knack, am acting as transcriptionist for Quillian Quince, MD.  I have reviewed the above documentation for accuracy and completeness, and I agree with the above. -  Quillian Quince, MD

## 2023-07-31 ENCOUNTER — Encounter

## 2023-07-31 ENCOUNTER — Inpatient Hospital Stay: Payer: BLUE CROSS/BLUE SHIELD | Primary: Family Medicine

## 2023-07-31 ENCOUNTER — Inpatient Hospital Stay: Admit: 2023-07-31 | Payer: BLUE CROSS/BLUE SHIELD | Primary: Family Medicine

## 2023-07-31 DIAGNOSIS — R928 Other abnormal and inconclusive findings on diagnostic imaging of breast: Secondary | ICD-10-CM

## 2023-08-03 ENCOUNTER — Other Ambulatory Visit (INDEPENDENT_AMBULATORY_CARE_PROVIDER_SITE_OTHER): Payer: Self-pay

## 2023-08-03 ENCOUNTER — Telehealth (INDEPENDENT_AMBULATORY_CARE_PROVIDER_SITE_OTHER): Payer: Self-pay | Admitting: Family Medicine

## 2023-08-03 DIAGNOSIS — F3289 Other specified depressive episodes: Secondary | ICD-10-CM

## 2023-08-03 DIAGNOSIS — R7303 Prediabetes: Secondary | ICD-10-CM

## 2023-08-03 DIAGNOSIS — E559 Vitamin D deficiency, unspecified: Secondary | ICD-10-CM

## 2023-08-03 MED ORDER — BUPROPION HCL ER (SR) 150 MG PO TB12
150.0000 mg | ORAL_TABLET | Freq: Two times a day (BID) | ORAL | 0 refills | Status: AC
Start: 2023-08-03 — End: ?

## 2023-08-03 MED ORDER — VITAMIN D (ERGOCALCIFEROL) 1.25 MG (50000 UNIT) PO CAPS
50000.0000 [IU] | ORAL_CAPSULE | ORAL | 0 refills | Status: AC
Start: 2023-08-03 — End: ?

## 2023-08-03 MED ORDER — METFORMIN HCL 500 MG PO TABS: ORAL_TABLET | ORAL | 0 refills | Status: DC

## 2023-08-03 NOTE — Telephone Encounter (Signed)
The patient mentioned that she and her daughter have changed their insurance. She stated that her insurance will only cover any medication if it is a 90-day supply. The patient's mother mentioned that she does have documentation to show proof. She would like a call as soon as possible regarding this situation because they will be here on 8/29 and 9/4 and will not be able to pick up her medication.

## 2023-08-03 NOTE — Telephone Encounter (Signed)
Pt requesting 90 day supply- Wellbutrin, metformin, vitamin D prescribed 07/29/23

## 2023-08-03 NOTE — Telephone Encounter (Signed)
Ok to all  

## 2023-08-03 NOTE — Telephone Encounter (Signed)
Prescription was sent to the pharmacy

## 2023-08-26 ENCOUNTER — Encounter (INDEPENDENT_AMBULATORY_CARE_PROVIDER_SITE_OTHER): Payer: Self-pay | Admitting: Family Medicine

## 2023-08-26 ENCOUNTER — Ambulatory Visit (INDEPENDENT_AMBULATORY_CARE_PROVIDER_SITE_OTHER): Payer: Managed Care, Other (non HMO) | Admitting: Family Medicine

## 2023-08-26 VITALS — BP 122/82 | HR 89 | Temp 98.2°F | Ht 64.0 in | Wt 259.0 lb

## 2023-08-26 DIAGNOSIS — F3289 Other specified depressive episodes: Secondary | ICD-10-CM

## 2023-08-26 DIAGNOSIS — R7303 Prediabetes: Secondary | ICD-10-CM

## 2023-08-26 DIAGNOSIS — Z6841 Body Mass Index (BMI) 40.0 and over, adult: Secondary | ICD-10-CM

## 2023-08-26 DIAGNOSIS — F5089 Other specified eating disorder: Secondary | ICD-10-CM | POA: Diagnosis not present

## 2023-08-26 DIAGNOSIS — E559 Vitamin D deficiency, unspecified: Secondary | ICD-10-CM

## 2023-08-26 DIAGNOSIS — E669 Obesity, unspecified: Secondary | ICD-10-CM

## 2023-08-26 DIAGNOSIS — K5909 Other constipation: Secondary | ICD-10-CM | POA: Diagnosis not present

## 2023-08-26 MED ORDER — METFORMIN HCL 500 MG PO TABS
ORAL_TABLET | ORAL | 0 refills | Status: DC
Start: 2023-08-26 — End: 2023-12-02

## 2023-08-26 MED ORDER — POLYETHYLENE GLYCOL 3350 17 G PO PACK
17.0000 g | PACK | Freq: Every day | ORAL | 0 refills | Status: DC
Start: 2023-08-26 — End: 2024-07-20

## 2023-08-26 MED ORDER — VITAMIN D (ERGOCALCIFEROL) 1.25 MG (50000 UNIT) PO CAPS
50000.0000 [IU] | ORAL_CAPSULE | ORAL | 0 refills | Status: DC
Start: 2023-08-26 — End: 2023-12-02

## 2023-08-26 MED ORDER — BUPROPION HCL ER (SR) 150 MG PO TB12
150.0000 mg | ORAL_TABLET | Freq: Two times a day (BID) | ORAL | 0 refills | Status: DC
Start: 2023-08-26 — End: 2023-12-02

## 2023-08-26 NOTE — Progress Notes (Unsigned)
Chief Complaint:   OBESITY Sarah Fields is here to discuss her progress with her obesity treatment plan along with follow-up of her obesity related diagnoses. Sarah Fields is on the Category 3 Plan and states she is following her eating plan approximately 70% of the time. Sarah Fields states she is doing 0 minutes 0 times per week.  Today's visit was #: 46 Starting weight: 295 lbs Starting date: 12/13/2020 Today's weight: 259 lbs Today's date: 08/26/2023 Total lbs lost to date: 36 Total lbs lost since last in-office visit: 0  Interim History: Patient has been doing a lot of traveling and going to conferences.  She is retaining some water weight today, likely due to in part of increased sodium of eating out.  She is ready to get back on track.  Subjective:   1. Vitamin D deficiency Patient is on vitamin D, and she needs a refill.  No side effects were noted and vitamin D level was at goal.  2. Pre-diabetes Patient is doing well on metformin and she is getting back to decreasing simple carbohydrates in her diet.  3. Other constipation Patient is stable on MiraLAX with no side effects noted.  She requests a refill today.  4. Emotional Eating Behavior Patient is stable on her medications, and she continues to be mindful of emotional eating behavior.  Assessment/Plan:   1. Vitamin D deficiency Patient will continue prescription vitamin D 50,000 IU once weekly, and we will refill for 90 days.  - Vitamin D, Ergocalciferol, (DRISDOL) 1.25 MG (50000 UNIT) CAPS capsule; Take 1 capsule (50,000 Units total) by mouth every 7 (seven) days. Dispense: 12 capsule; Refill: 0   2. Pre-diabetes Patient will continue metformin 500 mg twice daily, and we will refill for 90 days.  - metFORMIN (GLUCOPHAGE) 500 MG tablet; Take 1 tablet PO BID with meals  Dispense: 180 tablet; Refill: 0   3. Other constipation Patient will continue MiraLAX 17 g daily; 90 packets, and we will refill for 90 days.  4.  Emotional Eating Behavior Patient will continue Wellbutrin SR 150 mg twice daily, and we will refill for 90 days.  - buPROPion (WELLBUTRIN SR) 150 MG 12 hr tablet; Take 1 tablet (150 mg total) by mouth 2 (two) times daily.  Dispense: 180 tablet; Refill: 0   5. BMI 40.0-44.9, adult (HCC)  6. Obesity, Beginning BMI 50.64 Sarah Fields is currently in the action stage of change. As such, her goal is to continue with weight loss efforts. She has agreed to the Category 3 Plan and keeping a food journal and adhering to recommended goals of 600 calories and 40+ grams of protein at supper daily.   Patient was shown how to meal plan and prep with [facility].  Behavioral modification strategies: increasing lean protein intake and meal planning and cooking strategies.  Sarah Fields has agreed to follow-up with our clinic in 4 weeks. She was informed of the importance of frequent follow-up visits to maximize her success with intensive lifestyle modifications for her multiple health conditions.   Objective:   Blood pressure 122/82, pulse 89, temperature 98.2 F (36.8 C), height 5\' 4"  (1.626 m), weight 259 lb (117.5 kg), SpO2 90%. Body mass index is 44.46 kg/m.  Lab Results  Component Value Date   CREATININE 1.03 (H) 04/07/2023   BUN 10 04/07/2023   NA 139 04/07/2023   K 4.7 04/07/2023   CL 101 04/07/2023   CO2 24 04/07/2023   Lab Results  Component Value Date   ALT  10 04/07/2023   AST 14 04/07/2023   ALKPHOS 88 04/07/2023   BILITOT 0.4 04/07/2023   Lab Results  Component Value Date   HGBA1C 5.5 04/07/2023   HGBA1C 5.3 10/23/2022   HGBA1C 5.2 06/19/2022   HGBA1C 5.4 02/11/2022   HGBA1C 5.5 07/31/2021   Lab Results  Component Value Date   INSULIN 16.2 04/07/2023   INSULIN 13.2 10/23/2022   INSULIN 12.0 06/19/2022   INSULIN 17.3 02/11/2022   INSULIN 19.6 07/31/2021   Lab Results  Component Value Date   TSH 4.580 (H) 12/25/2022   Lab Results  Component Value Date   CHOL 156  04/07/2023   HDL 41 04/07/2023   LDLCALC 83 04/07/2023   TRIG 191 (H) 04/07/2023   CHOLHDL 4.5 05/30/2015   Lab Results  Component Value Date   VD25OH 69.2 04/07/2023   VD25OH 48.0 10/23/2022   VD25OH 53.6 06/19/2022   Lab Results  Component Value Date   WBC 9.9 09/07/2021   HGB 14.2 09/07/2021   HCT 42.8 09/07/2021   MCV 93.4 09/07/2021   PLT 276 09/07/2021   No results found for: "IRON", "TIBC", "FERRITIN"  Attestation Statements:   Reviewed by clinician on day of visit: allergies, medications, problem list, medical history, surgical history, family history, social history, and previous encounter notes.   I, Burt Knack, am acting as transcriptionist for Quillian Quince, MD.  I have reviewed the above documentation for accuracy and completeness, and I agree with the above. -  Quillian Quince, MD

## 2023-08-28 ENCOUNTER — Encounter: Payer: 59 | Admitting: Nurse Practitioner

## 2023-08-31 ENCOUNTER — Encounter: Payer: Self-pay | Admitting: Family Medicine

## 2023-08-31 ENCOUNTER — Ambulatory Visit (INDEPENDENT_AMBULATORY_CARE_PROVIDER_SITE_OTHER): Payer: Managed Care, Other (non HMO) | Admitting: Family Medicine

## 2023-08-31 VITALS — BP 125/84 | HR 74 | Resp 18 | Ht 64.0 in | Wt 261.0 lb

## 2023-08-31 DIAGNOSIS — G43909 Migraine, unspecified, not intractable, without status migrainosus: Secondary | ICD-10-CM

## 2023-08-31 DIAGNOSIS — Z1211 Encounter for screening for malignant neoplasm of colon: Secondary | ICD-10-CM

## 2023-08-31 DIAGNOSIS — Z1212 Encounter for screening for malignant neoplasm of rectum: Secondary | ICD-10-CM

## 2023-08-31 DIAGNOSIS — E782 Mixed hyperlipidemia: Secondary | ICD-10-CM | POA: Diagnosis not present

## 2023-08-31 DIAGNOSIS — Z0000 Encounter for general adult medical examination without abnormal findings: Secondary | ICD-10-CM | POA: Diagnosis not present

## 2023-08-31 DIAGNOSIS — Z1159 Encounter for screening for other viral diseases: Secondary | ICD-10-CM

## 2023-08-31 DIAGNOSIS — Z23 Encounter for immunization: Secondary | ICD-10-CM | POA: Diagnosis not present

## 2023-08-31 DIAGNOSIS — E559 Vitamin D deficiency, unspecified: Secondary | ICD-10-CM

## 2023-08-31 DIAGNOSIS — Z6841 Body Mass Index (BMI) 40.0 and over, adult: Secondary | ICD-10-CM

## 2023-08-31 MED ORDER — NURTEC 75 MG PO TBDP: ORAL_TABLET | ORAL | 3 refills | Status: DC

## 2023-08-31 NOTE — Progress Notes (Signed)
Complete physical exam  Patient: Sarah Fields   DOB: January 17, 1976   47 y.o. Female  MRN: [id number]  Subjective:    Chief Complaint  Patient presents with   Annual Exam    Sarah Fields is a 47 y.o. female who presents today for a complete physical exam. She reports consuming a  high-protein  diet. She generally feels well. She reports sleeping well. She does not have additional problems to discuss today.    Most recent fall risk assessment:    02/24/2023    1:30 PM  Fall Risk   Falls in the past year? 0  Number falls in past yr: 0  Injury with Fall? 0  Follow up Falls evaluation completed     Most recent depression and anxiety screenings:    08/31/2023    4:08 PM 02/24/2023    1:30 PM  PHQ 2/9 Scores  PHQ - 2 Score 0 0  PHQ- 9 Score 2 1      08/31/2023    4:09 PM 08/25/2022    1:28 PM 02/19/2022   10:08 AM 08/21/2021    9:49 AM  GAD 7 : Generalized Anxiety Score  Nervous, Anxious, on Edge 0 1 0 0  Control/stop worrying 0 0 0 0  Worry too much - different things 0 0 0 0  Trouble relaxing 0 0 0 0  Restless 0 0 0 0  Easily annoyed or irritable 0 0 0 0  Afraid - awful might happen 0 0 0 0  Total GAD 7 Score 0 1 0 0  Anxiety Difficulty Not difficult at all       Patient Active Problem List   Diagnosis Date Noted   Other constipation 08/26/2023   TSH elevation 12/25/2022   Obesity, Beginning BMI 50.64 12/25/2022   Nonintractable headache 09/11/2022   Acute migraine 08/25/2022   Family history of colonic polyps 08/25/2022   Stress 07/31/2022   Class 3 severe obesity with serious comorbidity and body mass index (BMI) of 50.0 to 59.9 in adult (HCC) 07/31/2022   B12 deficiency 06/19/2022   Mixed hyperlipidemia 06/19/2022   At risk for impaired metabolic function 05/06/2022   Depression 03/25/2022   Pre-diabetes 02/23/2022   Low HDL (under 40) 09/01/2021   BMI 40.0-44.9, adult (HCC) 09/01/2021   Intractable migraine with aura without status  migrainosus 08/04/2021   Insulin resistance 03/21/2021   Vitamin D deficiency 03/21/2021   At risk for malnutrition 03/21/2021   Benign essential tremor 03/13/2020   Asthma 01/19/2020   Hypertriglyceridemia 05/31/2015    Past Surgical History:  Procedure Laterality Date   CESAREAN SECTION     2001, 2003   KNEE SURGERY Bilateral    arthroscopic   TUBAL LIGATION     WISDOM TOOTH EXTRACTION     Social History   Tobacco Use   Smoking status: Never    Passive exposure: Past   Smokeless tobacco: Never  Vaping Use   Vaping status: Never Used  Substance Use Topics   Alcohol use: Never    Alcohol/week: 0.0 standard drinks of alcohol   Drug use: Never   Family History  Problem Relation Age of Onset   Hyperlipidemia Mother    Migraines Mother    Thyroid disease Mother    Glaucoma Mother    Osteoporosis Mother    Anxiety disorder Mother    Seizures Father    Hypertension Father    Cancer Father 98       Prostate  Ca   Glaucoma Father    Stroke Maternal Grandmother    Cancer Maternal Grandfather    Migraines Maternal Grandfather    Stroke Paternal Grandfather    Tremor Neg Hx    Allergies  Allergen Reactions   Banana    Hydrocodone Other (See Comments)   Latex    Other     Pine apples, strawberries, banannas  "one of the 'codones makes me itch" but patient states she has been prescribed them   Penicillins Swelling    Patient only has allergy to the penicillin shot, she can take it orally with no reactions.   Pineapple    Propranolol     Worsening asthma   Strawberry Extract    Adhesive [Tape] Rash   Bactrim [Sulfamethoxazole-Trimethoprim] Rash     Patient Care Team: Melida Quitter, PA as PCP - General (Family Medicine)   Outpatient Medications Prior to Visit  Medication Sig   albuterol (PROVENTIL HFA;VENTOLIN HFA) 108 (90 BASE) MCG/ACT inhaler Inhale 2 puffs into the lungs every 6 (six) hours as needed for wheezing or shortness of breath.     Azelastine-Fluticasone 137-50 MCG/ACT SUSP Place 1 spray into both nostrils 2 (two) times daily.   buPROPion (WELLBUTRIN SR) 150 MG 12 hr tablet Take 1 tablet (150 mg total) by mouth 2 (two) times daily.   EPINEPHrine 0.3 mg/0.3 mL IJ SOAJ injection INJECT IN OUTER THIGH AS NEEDED FOR ANAPHYLAXIS   fluticasone (FLONASE) 50 MCG/ACT nasal spray Place 2 sprays into both nostrils daily.   levocetirizine (XYZAL) 5 MG tablet SMARTSIG:1 Tablet(s) By Mouth Every Evening   Magnesium 400 MG CAPS Take 1 tablet by mouth daily.   metFORMIN (GLUCOPHAGE) 500 MG tablet Take 1 tablet PO bid with meals   montelukast (SINGULAIR) 10 MG tablet Take 10 mg by mouth at bedtime.    norethindrone (INCASSIA) 0.35 MG tablet Take 1 tablet (0.35 mg total) by mouth daily.   polyethylene glycol (MIRALAX / GLYCOLAX) 17 g packet Take 17 g by mouth daily. With increased water intake.   primidone (MYSOLINE) 50 MG tablet TAKE 1 TABLET BY MOUTH EVERYDAY AT BEDTIME   rizatriptan (MAXALT) 10 MG tablet TAKE 1 TABLET BY MOUTH AS NEEDED FOR MIGRAINE. MAY REPEAT IN 2 HOURS IF NEEDED   sodium chloride (OCEAN) 0.65 % SOLN nasal spray Place 1 spray into both nostrils as needed for congestion.   SYMBICORT 160-4.5 MCG/ACT inhaler    Vitamin D, Ergocalciferol, (DRISDOL) 1.25 MG (50000 UNIT) CAPS capsule Take 1 capsule (50,000 Units total) by mouth every 7 (seven) days.   [DISCONTINUED] Rimegepant Sulfate (NURTEC) 75 MG TBDP Take 1 tablet by mouth x 1 dose for acute headache. (Max 75 mg/day)   No facility-administered medications prior to visit.    Review of Systems  Constitutional:  Negative for chills, fever and malaise/fatigue.  HENT:  Negative for congestion and hearing loss.   Eyes:  Negative for blurred vision and double vision.  Respiratory:  Negative for cough and shortness of breath.   Cardiovascular:  Negative for chest pain, palpitations and leg swelling.  Gastrointestinal:  Positive for constipation (Occasional, uses MiraLAX as  needed). Negative for abdominal pain, diarrhea and heartburn.  Genitourinary:  Negative for frequency and urgency.  Musculoskeletal:  Negative for myalgias and neck pain.  Neurological:  Negative for headaches.  Endo/Heme/Allergies:  Negative for polydipsia.  Psychiatric/Behavioral:  Negative for depression. The patient is not nervous/anxious and does not have insomnia.       Objective:  BP 125/84 (BP Location: Left Arm, Patient Position: Sitting, Cuff Size: Large)   Pulse 74   Resp 18   Ht 5\' 4"  (1.626 m)   Wt 261 lb (118.4 kg)   SpO2 100%   BMI 44.80 kg/m    Physical Exam Constitutional:      General: She is not in acute distress.    Appearance: Normal appearance.  HENT:     Head: Normocephalic and atraumatic.     Right Ear: Tympanic membrane, ear canal and external ear normal. There is no impacted cerumen.     Left Ear: Tympanic membrane, ear canal and external ear normal. There is no impacted cerumen.     Nose: Nose normal. No congestion.     Mouth/Throat:     Mouth: Mucous membranes are moist.     Pharynx: No oropharyngeal exudate or posterior oropharyngeal erythema.  Eyes:     Extraocular Movements: Extraocular movements intact.     Conjunctiva/sclera: Conjunctivae normal.     Pupils: Pupils are equal, round, and reactive to light.     Comments: Wearing glasses.  Eye exam later this week.  Neck:     Thyroid: No thyroid mass, thyromegaly or thyroid tenderness.  Cardiovascular:     Rate and Rhythm: Normal rate and regular rhythm.     Heart sounds: Normal heart sounds. No murmur heard.    No friction rub. No gallop.  Pulmonary:     Effort: Pulmonary effort is normal. No respiratory distress.     Breath sounds: Normal breath sounds. No wheezing, rhonchi or rales.  Abdominal:     General: Abdomen is flat. Bowel sounds are normal. There is no distension.     Palpations: There is no mass.     Tenderness: There is no abdominal tenderness. There is no guarding.   Musculoskeletal:        General: Normal range of motion.     Cervical back: Normal range of motion and neck supple.  Lymphadenopathy:     Cervical: No cervical adenopathy.  Skin:    General: Skin is warm and dry.  Neurological:     Mental Status: She is alert and oriented to person, place, and time.     Cranial Nerves: No cranial nerve deficit.     Motor: No weakness.     Deep Tendon Reflexes: Reflexes normal.  Psychiatric:        Mood and Affect: Mood normal.       Assessment & Plan:    Routine Health Maintenance and Physical Exam  Immunization History  Administered Date(s) Administered   Influenza, High Dose Seasonal PF 10/22/2021   Influenza, Seasonal, Injecte, Preservative Fre 08/31/2023   Influenza,inj,Quad PF,6+ Mos 08/21/2021, 08/22/2022   Influenza,inj,quad, With Preservative 08/04/2017   Moderna Sars-Covid-2 Vaccination 03/18/2020, 04/15/2020   Tdap 08/21/2021    Health Maintenance  Topic Date Due   Hepatitis C Screening  Never done   Fecal DNA (Cologuard)  Never done   COVID-19 Vaccine (3 - Moderna risk series) 05/13/2020   Cervical Cancer Screening (HPV/Pap Cotest)  10/16/2027   DTaP/Tdap/Td (2 - Td or Tdap) 08/22/2031   INFLUENZA VACCINE  Completed   HIV Screening  Completed   HPV VACCINES  Aged Out    Will collect labs including hepatitis C screening, CBC, CMP, lipid panel, A1C, TSH, and vitamin D.  Patient is agreeable to Cologuard for colorectal cancer screening.  Discussed health benefits of physical activity, and encouraged her to engage in regular exercise appropriate  for her age and condition.  Wellness examination  Need for influenza vaccination -     Flu vaccine trivalent PF, 6mos and older(Flulaval,Afluria,Fluarix,Fluzone)  Mixed hyperlipidemia -     Lipid panel; Future  Screening for colorectal cancer -     Cologuard  Acute migraine Assessment & Plan: Doing well on Nurtec as needed for acute migraine.  May also take Maxalt if  needed.  Refills sent today.  Orders: -     Nurtec; Take 1 tablet by mouth x 1 dose for acute headache. (Max 75 mg/day)  Dispense: 10 tablet; Refill: 3  Screening for viral disease -     Hepatitis C antibody; Future  Vitamin D deficiency -     VITAMIN D 25 Hydroxy (Vit-D Deficiency, Fractures); Future  BMI 40.0-44.9, adult (HCC) -     CBC with Differential/Platelet; Future -     Comprehensive metabolic panel; Future -     Hemoglobin A1c; Future -     Lipid panel; Future -     TSH Rfx on Abnormal to Free T4; Future -     VITAMIN D 25 Hydroxy (Vit-D Deficiency, Fractures); Future    Return in about 1 week (around 09/07/2023) for fasting blood work; 6 months for follow up, fasting labs 1 week before.     Melida Quitter, PA

## 2023-08-31 NOTE — Assessment & Plan Note (Signed)
Doing well on Nurtec as needed for acute migraine.  May also take Maxalt if needed.  Refills sent today.

## 2023-08-31 NOTE — Patient Instructions (Addendum)
If Dr. Dalbert Garnet does end up wanting labs in about 6 months when you are scheduled for labs here, please let me know so that I can coordinate to that you only have to get your blood drawn once.  Please send me a message if insurance gives you any trouble with Nurtec and you need a refill of the Maxalt in the meantime.

## 2023-09-01 ENCOUNTER — Other Ambulatory Visit: Payer: Managed Care, Other (non HMO)

## 2023-09-01 DIAGNOSIS — E559 Vitamin D deficiency, unspecified: Secondary | ICD-10-CM

## 2023-09-01 DIAGNOSIS — E782 Mixed hyperlipidemia: Secondary | ICD-10-CM

## 2023-09-01 DIAGNOSIS — Z6841 Body Mass Index (BMI) 40.0 and over, adult: Secondary | ICD-10-CM

## 2023-09-01 DIAGNOSIS — Z1159 Encounter for screening for other viral diseases: Secondary | ICD-10-CM

## 2023-09-02 LAB — LIPID PANEL
Chol/HDL Ratio: 4 {ratio} (ref 0.0–4.4)
Cholesterol, Total: 157 mg/dL (ref 100–199)
HDL: 39 mg/dL — ABNORMAL LOW (ref >39)
LDL Chol Calc (NIH): 92 mg/dL (ref 0–99)
Triglycerides: 147 mg/dL (ref 0–149)
VLDL Cholesterol Cal: 26 mg/dL (ref 5–40)

## 2023-09-02 LAB — CBC WITH DIFFERENTIAL/PLATELET
Basophils Absolute: 0.1 10*3/uL (ref 0.0–0.2)
Basos: 1 %
EOS (ABSOLUTE): 0.3 10*3/uL (ref 0.0–0.4)
Eos: 3 %
Hematocrit: 40.3 % (ref 34.0–46.6)
Hemoglobin: 13.4 g/dL (ref 11.1–15.9)
Immature Grans (Abs): 0.1 10*3/uL (ref 0.0–0.1)
Immature Granulocytes: 1 %
Lymphocytes Absolute: 1.8 10*3/uL (ref 0.7–3.1)
Lymphs: 20 %
MCH: 31.3 pg (ref 26.6–33.0)
MCHC: 33.3 g/dL (ref 31.5–35.7)
MCV: 94 fL (ref 79–97)
Monocytes Absolute: 0.6 10*3/uL (ref 0.1–0.9)
Monocytes: 7 %
Neutrophils Absolute: 6.4 10*3/uL (ref 1.4–7.0)
Neutrophils: 68 %
Platelets: 271 10*3/uL (ref 150–450)
RBC: 4.28 x10E6/uL (ref 3.77–5.28)
RDW: 12.1 % (ref 11.7–15.4)
WBC: 9.3 10*3/uL (ref 3.4–10.8)

## 2023-09-02 LAB — COMPREHENSIVE METABOLIC PANEL
ALT: 11 [IU]/L (ref 0–32)
AST: 13 [IU]/L (ref 0–40)
Albumin: 4 g/dL (ref 3.9–4.9)
Alkaline Phosphatase: 94 [IU]/L (ref 44–121)
BUN/Creatinine Ratio: 11 (ref 9–23)
BUN: 11 mg/dL (ref 6–24)
Bilirubin Total: 0.5 mg/dL (ref 0.0–1.2)
CO2: 23 mmol/L (ref 20–29)
Calcium: 8.9 mg/dL (ref 8.7–10.2)
Chloride: 104 mmol/L (ref 96–106)
Creatinine, Ser: 1.03 mg/dL — ABNORMAL HIGH (ref 0.57–1.00)
Globulin, Total: 2.3 g/dL (ref 1.5–4.5)
Glucose: 83 mg/dL (ref 70–99)
Potassium: 4.8 mmol/L (ref 3.5–5.2)
Sodium: 140 mmol/L (ref 134–144)
Total Protein: 6.3 g/dL (ref 6.0–8.5)
eGFR: 67 mL/min/{1.73_m2} (ref >59)

## 2023-09-02 LAB — HEMOGLOBIN A1C
Est. average glucose Bld gHb Est-mCnc: 108 mg/dL
Hgb A1c MFr Bld: 5.4 % (ref 4.8–5.6)

## 2023-09-02 LAB — HEPATITIS C ANTIBODY: Hep C Virus Ab: NONREACTIVE

## 2023-09-02 LAB — VITAMIN D 25 HYDROXY (VIT D DEFICIENCY, FRACTURES): Vit D, 25-Hydroxy: 66.1 ng/mL (ref 30.0–100.0)

## 2023-09-02 LAB — TSH RFX ON ABNORMAL TO FREE T4: TSH: 3.75 u[IU]/mL (ref 0.450–4.500)

## 2023-09-29 LAB — COLOGUARD: COLOGUARD: NEGATIVE

## 2023-09-30 ENCOUNTER — Encounter (INDEPENDENT_AMBULATORY_CARE_PROVIDER_SITE_OTHER): Payer: Self-pay | Admitting: Family Medicine

## 2023-09-30 ENCOUNTER — Ambulatory Visit (INDEPENDENT_AMBULATORY_CARE_PROVIDER_SITE_OTHER): Payer: Commercial Managed Care - HMO | Admitting: Family Medicine

## 2023-09-30 VITALS — BP 124/87 | HR 96 | Temp 98.4°F | Ht 64.0 in | Wt 256.0 lb

## 2023-09-30 DIAGNOSIS — R7303 Prediabetes: Secondary | ICD-10-CM | POA: Diagnosis not present

## 2023-09-30 DIAGNOSIS — R030 Elevated blood-pressure reading, without diagnosis of hypertension: Secondary | ICD-10-CM

## 2023-09-30 DIAGNOSIS — Z6841 Body Mass Index (BMI) 40.0 and over, adult: Secondary | ICD-10-CM

## 2023-09-30 DIAGNOSIS — E669 Obesity, unspecified: Secondary | ICD-10-CM

## 2023-09-30 NOTE — Progress Notes (Signed)
[facility]  Office: [phone]  /  Fax: [phone]  WEIGHT SUMMARY AND BIOMETRICS  Anthropometric Measurements Height: 5\' 4"  (1.626 m) Weight: 256 lb (116.1 kg) BMI (Calculated): 43.92 Weight at Last Visit: 259 lb Weight Lost Since Last Visit: 3 lb Weight Gained Since Last Visit: 0 Starting Weight: 295 lb Total Weight Loss (lbs): 39 lb (17.7 kg) Peak Weight: 300 lb   Body Composition  Body Fat %: 49.5 % Fat Mass (lbs): 127 lbs Muscle Mass (lbs): 123 lbs Total Body Water (lbs): 91.6 lbs Visceral Fat Rating : 16   Other Clinical Data Fasting: No Labs: No Today's Visit #: 28 Starting Date: 12/13/20    Chief Complaint: OBESITY   History of Present Illness   The patient, with a history of obesity, prediabetes, and elevated blood pressure, presents for a follow-up visit. She has been working on dietary modifications and has lost three pounds in the last month. She reports adherence to a category three eating plan about 50% of the time. However, she is not currently engaging in any exercise regimen.  The patient has been on metformin for prediabetes management and reports no issues with the medication. She has been following a lower carbohydrate diet as part of her management plan.  She does not have a history of hypertension and is not on any antihypertensive medications.  The patient has been experiencing challenges with meal planning and maintaining a healthy diet due to frequent travel. She reports difficulties in incorporating vegetables into her meals, especially when on the go.  Despite these challenges, the patient has shown some progress in weight loss. She has been trying to increase her protein intake and is mindful of her eating habits, particularly during holiday events. She has strategies in place to manage portion sizes and avoid overeating during these occasions.          PHYSICAL EXAM:  Blood pressure 124/87, pulse 96, temperature 98.4 F (36.9 C), height  5\' 4"  (1.626 m), weight 256 lb (116.1 kg), SpO2 96%. Body mass index is 43.94 kg/m.  DIAGNOSTIC DATA REVIEWED:  BMET    Component Value Date/Time   NA 140 09/01/2023 0903   K 4.8 09/01/2023 0903   CL 104 09/01/2023 0903   CO2 23 09/01/2023 0903   GLUCOSE 83 09/01/2023 0903   GLUCOSE 88 09/07/2021 1105   BUN 11 09/01/2023 0903   CREATININE 1.03 (H) 09/01/2023 0903   CREATININE 0.90 08/30/2019 1143   CREATININE 0.79 05/30/2015 0922   CALCIUM 8.9 09/01/2023 0903   GFRNONAA >60 09/07/2021 1105   GFRNONAA >60 08/30/2019 1143   GFRAA 80 12/13/2020 0942   GFRAA >60 08/30/2019 1143   Lab Results  Component Value Date   HGBA1C 5.4 09/01/2023   HGBA1C 5.5 05/30/2015   Lab Results  Component Value Date   INSULIN 16.2 04/07/2023   INSULIN 21.4 12/13/2020   Lab Results  Component Value Date   TSH 3.750 09/01/2023   CBC    Component Value Date/Time   WBC 9.3 09/01/2023 0903   WBC 9.9 09/07/2021 1105   RBC 4.28 09/01/2023 0903   RBC 4.58 09/07/2021 1105   HGB 13.4 09/01/2023 0903   HGB 12.0 05/30/2015 0856   HCT 40.3 09/01/2023 0903   PLT 271 09/01/2023 0903   MCV 94 09/01/2023 0903   MCH 31.3 09/01/2023 0903   MCH 31.0 09/07/2021 1105   MCHC 33.3 09/01/2023 0903   MCHC 33.2 09/07/2021 1105   RDW 12.1 09/01/2023 0903   Iron  Studies No results found for: "IRON", "TIBC", "FERRITIN", "IRONPCTSAT" Lipid Panel     Component Value Date/Time   CHOL 157 09/01/2023 0903   TRIG 147 09/01/2023 0903   HDL 39 (L) 09/01/2023 0903   CHOLHDL 4.0 09/01/2023 0903   CHOLHDL 4.5 05/30/2015 0922   VLDL 59 (H) 05/30/2015 0922   LDLCALC 92 09/01/2023 0903   Hepatic Function Panel     Component Value Date/Time   PROT 6.3 09/01/2023 0903   ALBUMIN 4.0 09/01/2023 0903   AST 13 09/01/2023 0903   AST 14 (L) 08/30/2019 1143   ALT 11 09/01/2023 0903   ALT 12 08/30/2019 1143   ALKPHOS 94 09/01/2023 0903   BILITOT 0.5 09/01/2023 0903   BILITOT 0.3 08/30/2019 1143      Component  Value Date/Time   TSH 3.750 09/01/2023 0903   TSH 4.580 (H) 12/25/2022 0837   Nutritional Lab Results  Component Value Date   VD25OH 66.1 09/01/2023   VD25OH 69.2 04/07/2023   VD25OH 48.0 10/23/2022     Assessment and Plan    Obesity Patient has lost 3 pounds in the last month, but is not currently exercising. She is following her category three eating plan 50% of the time. Discussed strategies for maintaining diet during travel and upcoming holidays. -Continue current diet plan and attempt to increase adherence. -Consider incorporating exercise into routine.  Prediabetes Patient is on Metformin and is working on diet and exercise for management. No reported issues with medication. -Continue Metformin 500 mg BID -Continue diet and exercise plan.  Elevated Blood Pressure with no history of HTN Initial reading was elevated, but repeat reading was within normal range. Patient is not currently on any antihypertensive medications and is working on diet and exercise for management. -Monitor blood pressure. -Continue diet and exercise plan.  Follow-up appointment scheduled for 11/03/2023.       She was informed of the importance of frequent follow up visits to maximize her success with intensive lifestyle modifications for her multiple health conditions.    Quillian Quince, MD

## 2023-10-06 NOTE — Progress Notes (Signed)
47 y.o. G54P2002 Divorced Caucasian female here for annual exam.    Occasional hot flashes.  No night sweats.   Having menstrual cycles once a month.   No problems with her pills.   PCP: Melida Quitter, PA   Patient's last menstrual period was 10/19/2023.     Period Cycle (Days): 28 Period Duration (Days): 5-7 Period Pattern: Regular Menstrual Flow: Moderate     Sexually active: No.  The current method of family planning is tubal ligation/POP.    Menopausal hormone therapy:  n/a Exercising: No.   Smoker:  no  OB History  Gravida Para Term Preterm AB Living  2 2 2     2   SAB IAB Ectopic Multiple Live Births               # Outcome Date GA Lbr Len/2nd Weight Sex Type Anes PTL Lv  2 Term           1 Term              HEALTH MAINTENANCE:    Component Value Date/Time   DIAGPAP  10/15/2022 1533    - Negative for intraepithelial lesion or malignancy (NILM)   DIAGPAP  07/02/2017 0000    NEGATIVE FOR INTRAEPITHELIAL LESIONS OR MALIGNANCY. BENIGN REACTIVE/REPARATIVE CHANGES.   HPVHIGH Negative 10/15/2022 1533   ADEQPAP  10/15/2022 1533    Satisfactory for evaluation; transformation zone component PRESENT.   ADEQPAP  07/02/2017 0000    Satisfactory for evaluation  endocervical/transformation zone component PRESENT.    History of abnormal Pap or positive HPV:  yes, 2000 hx of colposcopy but no treatment to cervix.  Mammogram: 12/23/22 Breast Density Cat A, BI-RADS CAT 1 neg Colonoscopy:  09/21/23 - negative Cologuard.  Bone Density:  n/a  Result  n/a   Immunization History  Administered Date(s) Administered   Influenza, High Dose Seasonal PF 10/22/2021   Influenza, Seasonal, Injecte, Preservative Fre 08/31/2023   Influenza,inj,Quad PF,6+ Mos 08/21/2021, 08/22/2022   Influenza,inj,quad, With Preservative 08/04/2017   Moderna Sars-Covid-2 Vaccination 03/18/2020, 04/15/2020   Tdap 08/21/2021      reports that she has never smoked. She has been exposed to tobacco  smoke. She has never used smokeless tobacco. She reports that she does not drink alcohol and does not use drugs.  Past Medical History:  Diagnosis Date   Allergy    Anxiety    Asthma    Broken foot    Depression    Essential tremor    Gestational diabetes    h/o   History of COVID-19 04/24/2021   Joint pain    Migraines    with aura   Other fatigue    Shortness of breath    Shortness of breath on exertion    Tenosynovitis, de Quervain    Vitamin D deficiency     Past Surgical History:  Procedure Laterality Date   CESAREAN SECTION     2001, 2003   KNEE SURGERY Bilateral    arthroscopic   TUBAL LIGATION     WISDOM TOOTH EXTRACTION      Current Outpatient Medications  Medication Sig Dispense Refill   albuterol (PROVENTIL HFA;VENTOLIN HFA) 108 (90 BASE) MCG/ACT inhaler Inhale 2 puffs into the lungs every 6 (six) hours as needed for wheezing or shortness of breath.      Azelastine-Fluticasone 137-50 MCG/ACT SUSP Place 1 spray into both nostrils 2 (two) times daily.     buPROPion (WELLBUTRIN SR) 150 MG 12 hr tablet  Take 1 tablet (150 mg total) by mouth 2 (two) times daily. 180 tablet 0   EPINEPHrine 0.3 mg/0.3 mL IJ SOAJ injection INJECT IN OUTER THIGH AS NEEDED FOR ANAPHYLAXIS     fluticasone (FLONASE) 50 MCG/ACT nasal spray Place 2 sprays into both nostrils daily.     levocetirizine (XYZAL) 5 MG tablet SMARTSIG:1 Tablet(s) By Mouth Every Evening     Magnesium 400 MG CAPS Take 1 tablet by mouth daily.     metFORMIN (GLUCOPHAGE) 500 MG tablet Take 1 tablet PO bid with meals 180 tablet 0   montelukast (SINGULAIR) 10 MG tablet Take 10 mg by mouth at bedtime.      norethindrone (INCASSIA) 0.35 MG tablet Take 1 tablet (0.35 mg total) by mouth daily. 84 tablet 3   polyethylene glycol (MIRALAX / GLYCOLAX) 17 g packet Take 17 g by mouth daily. With increased water intake. 90 packet 0   primidone (MYSOLINE) 50 MG tablet TAKE 1 TABLET BY MOUTH EVERYDAY AT BEDTIME 90 tablet 4    Rimegepant Sulfate (NURTEC) 75 MG TBDP Take 1 tablet by mouth x 1 dose for acute headache. (Max 75 mg/day) 10 tablet 3   rizatriptan (MAXALT) 10 MG tablet TAKE 1 TABLET BY MOUTH AS NEEDED FOR MIGRAINE. MAY REPEAT IN 2 HOURS IF NEEDED 4 tablet 14   sodium chloride (OCEAN) 0.65 % SOLN nasal spray Place 1 spray into both nostrils as needed for congestion.  0   SYMBICORT 160-4.5 MCG/ACT inhaler      Vitamin D, Ergocalciferol, (DRISDOL) 1.25 MG (50000 UNIT) CAPS capsule Take 1 capsule (50,000 Units total) by mouth every 7 (seven) days. 12 capsule 0   No current facility-administered medications for this visit.    ALLERGIES: Banana, Hydrocodone, Latex, Other, Penicillins, Pineapple, Propranolol, Strawberry extract, Adhesive [tape], and Bactrim [sulfamethoxazole-trimethoprim]  Family History  Problem Relation Age of Onset   Hyperlipidemia Mother    Migraines Mother    Thyroid disease Mother    Glaucoma Mother    Osteoporosis Mother    Anxiety disorder Mother    Seizures Father    Hypertension Father    Cancer Father 57       Prostate Ca   Glaucoma Father    Stroke Maternal Grandmother    Cancer Maternal Grandfather    Migraines Maternal Grandfather    Stroke Paternal Grandfather    Tremor Neg Hx     Review of Systems  All other systems reviewed and are negative.   PHYSICAL EXAM:  BP 124/82 (BP Location: Left Arm, Patient Position: Sitting, Cuff Size: Large)   Pulse (!) 105   Ht 5' 4.5" (1.638 m)   Wt 261 lb (118.4 kg)   LMP 10/19/2023   SpO2 98%   BMI 44.11 kg/m     General appearance: alert, cooperative and appears stated age Head: normocephalic, without obvious abnormality, atraumatic Neck: no adenopathy, supple, symmetrical, trachea midline and thyroid normal to inspection and palpation Lungs: clear to auscultation bilaterally Breasts: normal appearance, no masses or tenderness, No nipple retraction or dimpling, No nipple discharge or bleeding, No axillary  adenopathy Heart: regular rate and rhythm Abdomen: soft, non-tender; no masses, no organomegaly Extremities: extremities normal, atraumatic, no cyanosis or edema Skin: skin color, texture, turgor normal. No rashes or lesions Lymph nodes: cervical, supraclavicular, and axillary nodes normal. Neurologic: grossly normal  Pelvic: External genitalia:  no lesions              No abnormal inguinal nodes palpated.  Urethra:  normal appearing urethra with no masses, tenderness or lesions              Bartholins and Skenes: normal                 Vagina: normal appearing vagina with normal color and discharge, no lesions              Cervix: no lesions.  Menstrual flow noted.               Pap taken: Yes.   Bimanual Exam:  Uterus:  normal size, contour, position, consistency, mobility, non-tender              Adnexa: no mass, fullness, tenderness              Rectal exam: Yes.  .  Confirms.              Anus:  normal sphincter tone, no lesions  Chaperone was present for exam:  Warren Lacy, CMA  PHQ-9 score:  0.  ASSESSMENT: Well woman visit with gynecologic exam Status post BTL.  On Micronor for menorrhagia. Hx cryotherapy.  Migraines with aura.   PLAN: Mammogram screening discussed. Self breast awareness reviewed. Pap and HRV collected:  No.  2028. Guidelines for Calcium, Vitamin D, regular exercise program including cardiovascular and weight bearing exercise. Medication refills:  Micronor x 12 months.  Follow up:  1 year and prn.    An After Visit Summary was provided to the patient.

## 2023-10-20 ENCOUNTER — Ambulatory Visit (INDEPENDENT_AMBULATORY_CARE_PROVIDER_SITE_OTHER): Payer: Commercial Managed Care - HMO | Admitting: Obstetrics and Gynecology

## 2023-10-20 ENCOUNTER — Encounter: Payer: Self-pay | Admitting: Obstetrics and Gynecology

## 2023-10-20 VITALS — BP 124/82 | HR 105 | Ht 64.5 in | Wt 261.0 lb

## 2023-10-20 DIAGNOSIS — Z01419 Encounter for gynecological examination (general) (routine) without abnormal findings: Secondary | ICD-10-CM | POA: Diagnosis not present

## 2023-10-20 MED ORDER — NORETHINDRONE 0.35 MG PO TABS: 1.0000 | ORAL_TABLET | Freq: Every day | ORAL | 3 refills | Status: DC

## 2023-10-20 NOTE — Patient Instructions (Signed)

## 2023-11-01 ENCOUNTER — Other Ambulatory Visit (INDEPENDENT_AMBULATORY_CARE_PROVIDER_SITE_OTHER): Payer: Self-pay | Admitting: Family Medicine

## 2023-11-01 DIAGNOSIS — K5909 Other constipation: Secondary | ICD-10-CM

## 2023-11-03 ENCOUNTER — Encounter (INDEPENDENT_AMBULATORY_CARE_PROVIDER_SITE_OTHER): Payer: Self-pay | Admitting: Family Medicine

## 2023-11-03 ENCOUNTER — Ambulatory Visit (INDEPENDENT_AMBULATORY_CARE_PROVIDER_SITE_OTHER): Payer: Commercial Managed Care - HMO | Admitting: Family Medicine

## 2023-11-03 VITALS — BP 117/83 | HR 85 | Temp 98.2°F | Ht 64.0 in | Wt 256.0 lb

## 2023-11-03 DIAGNOSIS — E559 Vitamin D deficiency, unspecified: Secondary | ICD-10-CM

## 2023-11-03 DIAGNOSIS — E669 Obesity, unspecified: Secondary | ICD-10-CM

## 2023-11-03 DIAGNOSIS — E66813 Obesity, class 3: Secondary | ICD-10-CM

## 2023-11-03 DIAGNOSIS — R7303 Prediabetes: Secondary | ICD-10-CM

## 2023-11-03 DIAGNOSIS — Z6841 Body Mass Index (BMI) 40.0 and over, adult: Secondary | ICD-10-CM | POA: Diagnosis not present

## 2023-11-03 NOTE — Progress Notes (Signed)
.smr  Office: [phone]  /  Fax: [phone]  WEIGHT SUMMARY AND BIOMETRICS  Anthropometric Measurements Height: 5\' 4"  (1.626 m) Weight: 256 lb (116.1 kg) BMI (Calculated): 43.92 Weight at Last Visit: 256 LB Weight Lost Since Last Visit: 0 Weight Gained Since Last Visit: 0 Starting Weight: 295 LB Total Weight Loss (lbs): 39 lb (17.7 kg) Peak Weight: 300 lb   Body Composition  Body Fat %: 49.5 % Fat Mass (lbs): 126.8 lbs Muscle Mass (lbs): 122.6 lbs Total Body Water (lbs): 93 lbs Visceral Fat Rating : 16   Other Clinical Data Fasting: no Labs: no Today's Visit #: 48 Starting Date: 12/13/20    Chief Complaint: OBESITY  History of Present Illness   The patient, with a history of vitamin D deficiency and obesity, has been adhering to her category three plan approximately 75% of the time. She has maintained her weight over the past month, including over the holiday period. She has been engaging in physical activity, walking for about 30 minutes twice a week.  The patient recently spent a week in [place] for work, which presented challenges in terms of finding suitable food and involved a significant increase in walking. Despite these challenges, she did not report any weight gain during this period.  The patient has been making dietary adjustments, including increasing her protein intake. She has found a preferred grocery store, [employer], which has helped with affordability and variety in her diet.  The patient's living situation has recently changed with her daughter moving out. This has impacted her eating habits, as she no longer needs to cater to her daughter's dietary preferences.  The patient's vitamin D levels were last checked in October and were found to be satisfactory. Her last A1C was also within the normal range. She is currently on prescription vitamin D (50,000 international units weekly) and metformin (500mg  BID).          PHYSICAL EXAM:  Blood  pressure 117/83, pulse 85, temperature 98.2 F (36.8 C), height 5\' 4"  (1.626 m), weight 256 lb (116.1 kg), last menstrual period 10/19/2023, SpO2 98%. Body mass index is 43.94 kg/m.  DIAGNOSTIC DATA REVIEWED:  BMET    Component Value Date/Time   NA 140 09/01/2023 0903   K 4.8 09/01/2023 0903   CL 104 09/01/2023 0903   CO2 23 09/01/2023 0903   GLUCOSE 83 09/01/2023 0903   GLUCOSE 88 09/07/2021 1105   BUN 11 09/01/2023 0903   CREATININE 1.03 (H) 09/01/2023 0903   CREATININE 0.90 08/30/2019 1143   CREATININE 0.79 05/30/2015 0922   CALCIUM 8.9 09/01/2023 0903   GFRNONAA >60 09/07/2021 1105   GFRNONAA >60 08/30/2019 1143   GFRAA 80 12/13/2020 0942   GFRAA >60 08/30/2019 1143   Lab Results  Component Value Date   HGBA1C 5.4 09/01/2023   HGBA1C 5.5 05/30/2015   Lab Results  Component Value Date   INSULIN 16.2 04/07/2023   INSULIN 21.4 12/13/2020   Lab Results  Component Value Date   TSH 3.750 09/01/2023   CBC    Component Value Date/Time   WBC 9.3 09/01/2023 0903   WBC 9.9 09/07/2021 1105   RBC 4.28 09/01/2023 0903   RBC 4.58 09/07/2021 1105   HGB 13.4 09/01/2023 0903   HGB 12.0 05/30/2015 0856   HCT 40.3 09/01/2023 0903   PLT 271 09/01/2023 0903   MCV 94 09/01/2023 0903   MCH 31.3 09/01/2023 0903   MCH 31.0 09/07/2021 1105   MCHC 33.3 09/01/2023 0903  MCHC 33.2 09/07/2021 1105   RDW 12.1 09/01/2023 0903   Iron Studies No results found for: "IRON", "TIBC", "FERRITIN", "IRONPCTSAT" Lipid Panel     Component Value Date/Time   CHOL 157 09/01/2023 0903   TRIG 147 09/01/2023 0903   HDL 39 (L) 09/01/2023 0903   CHOLHDL 4.0 09/01/2023 0903   CHOLHDL 4.5 05/30/2015 0922   VLDL 59 (H) 05/30/2015 0922   LDLCALC 92 09/01/2023 0903   Hepatic Function Panel     Component Value Date/Time   PROT 6.3 09/01/2023 0903   ALBUMIN 4.0 09/01/2023 0903   AST 13 09/01/2023 0903   AST 14 (L) 08/30/2019 1143   ALT 11 09/01/2023 0903   ALT 12 08/30/2019 1143   ALKPHOS  94 09/01/2023 0903   BILITOT 0.5 09/01/2023 0903   BILITOT 0.3 08/30/2019 1143      Component Value Date/Time   TSH 3.750 09/01/2023 0903   TSH 4.580 (H) 12/25/2022 0837   Nutritional Lab Results  Component Value Date   VD25OH 66.1 09/01/2023   VD25OH 69.2 04/07/2023   VD25OH 48.0 10/23/2022     Assessment and Plan    Obesity Weight maintained over the last month, including over Thanksgiving. Following category three plan 75% of the time and walking 30 minutes twice a week. Discussed challenges with diet and exercise during travel and holidays. Prefers to avoid high-cost meals and finds walking beneficial despite occasional back pain. - Continue current diet and exercise regimen - Reassess in January  Prediabetes Well-controlled with diet and metformin. Last A1c was 5.4. - Continue metformin 500 mg BID  Vitamin D Deficiency Managed with prescription vitamin D 50,000 IU weekly. Last vitamin D level was 66, indicating good control. - Continue prescription vitamin D 50,000 IU weekly  General Health Maintenance None - Schedule appointments for February and March to ensure continuity of care.      She was informed of the importance of frequent follow up visits to maximize her success with intensive lifestyle modifications for her multiple health conditions.    Quillian Quince, MD

## 2023-11-09 ENCOUNTER — Other Ambulatory Visit (HOSPITAL_COMMUNITY): Payer: Self-pay

## 2023-11-16 ENCOUNTER — Other Ambulatory Visit (INDEPENDENT_AMBULATORY_CARE_PROVIDER_SITE_OTHER): Payer: Self-pay | Admitting: Family Medicine

## 2023-11-16 DIAGNOSIS — E559 Vitamin D deficiency, unspecified: Secondary | ICD-10-CM

## 2023-12-02 ENCOUNTER — Ambulatory Visit (INDEPENDENT_AMBULATORY_CARE_PROVIDER_SITE_OTHER): Payer: Commercial Managed Care - HMO | Admitting: Family Medicine

## 2023-12-02 ENCOUNTER — Encounter (INDEPENDENT_AMBULATORY_CARE_PROVIDER_SITE_OTHER): Payer: Self-pay | Admitting: Family Medicine

## 2023-12-02 VITALS — BP 133/84 | HR 93 | Temp 98.2°F | Ht 64.0 in | Wt 255.0 lb

## 2023-12-02 DIAGNOSIS — E669 Obesity, unspecified: Secondary | ICD-10-CM | POA: Diagnosis not present

## 2023-12-02 DIAGNOSIS — E559 Vitamin D deficiency, unspecified: Secondary | ICD-10-CM | POA: Diagnosis not present

## 2023-12-02 DIAGNOSIS — F3289 Other specified depressive episodes: Secondary | ICD-10-CM

## 2023-12-02 DIAGNOSIS — F5089 Other specified eating disorder: Secondary | ICD-10-CM

## 2023-12-02 DIAGNOSIS — Z6841 Body Mass Index (BMI) 40.0 and over, adult: Secondary | ICD-10-CM

## 2023-12-02 DIAGNOSIS — R7303 Prediabetes: Secondary | ICD-10-CM

## 2023-12-02 MED ORDER — METFORMIN HCL 500 MG PO TABS
ORAL_TABLET | ORAL | 0 refills | Status: DC
Start: 2023-12-02 — End: 2024-02-02

## 2023-12-02 MED ORDER — VITAMIN D (ERGOCALCIFEROL) 1.25 MG (50000 UNIT) PO CAPS
50000.0000 [IU] | ORAL_CAPSULE | ORAL | 0 refills | Status: DC
Start: 2023-12-02 — End: 2024-02-02

## 2023-12-02 MED ORDER — BUPROPION HCL ER (SR) 150 MG PO TB12: 150.0000 mg | ORAL_TABLET | Freq: Two times a day (BID) | ORAL | 0 refills | Status: DC

## 2023-12-02 NOTE — Progress Notes (Signed)
 .smr  Office: [phone]  /  Fax: [phone]  WEIGHT SUMMARY AND BIOMETRICS  Anthropometric Measurements Height: 5' 4 (1.626 m) Weight: 255 lb (115.7 kg) BMI (Calculated): 43.75 Weight at Last Visit: 256 lb Weight Lost Since Last Visit: 1 lb Weight Gained Since Last Visit: 0 Starting Weight: 295 lb Total Weight Loss (lbs): 40 lb (18.1 kg) Peak Weight: 300 lb   Body Composition  Body Fat %: 49.3 % Fat Mass (lbs): 126 lbs Muscle Mass (lbs): 122.8 lbs Total Body Water (lbs): 91.8 lbs Visceral Fat Rating : 16   Other Clinical Data Fasting: No Labs: No Today's Visit #: 70 Starting Date: 12/13/20    Chief Complaint: OBESITY   Discussed the use of AI scribe software for clinical note transcription with the patient, who gave verbal consent to proceed.  History of Present Illness   The patient, with a history of prediabetes, vitamin D  deficiency, emotional eating behaviors, and obesity, presents for a routine follow-up. She is currently on Wellbutrin  for emotional eating behaviors, prescription vitamin D  50,000 IU weekly, and Metformin  for prediabetes. She reports adherence to her medications and requests a 90-day refill for all.  Over the past month, the patient has lost one pound, even during the holiday season. She reports following her category three eating plan 75% of the time but is not currently exercising. She attempted to restart her exercise regimen but was only able to maintain it for four days. She also visited a fitness center with her daughter on New Year's Eve and spent an hour and a half exercising, which she found challenging.  The patient also reports occasional use of Miralax  as needed. She expresses satisfaction with her current category three eating plan. However, she is currently facing significant financial constraints, which is impacting her ability to maintain a healthy diet. She is exploring cost-effective options, such as beans, to supplement her  meals.  The patient also reports recent stressors, including financial support for her adult children who recently lost their jobs. This has led to a tight financial situation, particularly impacting her ability to afford food. Despite these challenges, the patient remains committed to managing her health conditions and preventing the onset of diabetes.          PHYSICAL EXAM:  Blood pressure 133/84, pulse 93, temperature 98.2 F (36.8 C), height 5' 4 (1.626 m), weight 255 lb (115.7 kg), SpO2 97%. Body mass index is 43.77 kg/m.  DIAGNOSTIC DATA REVIEWED:  BMET    Component Value Date/Time   NA 140 09/01/2023 0903   K 4.8 09/01/2023 0903   CL 104 09/01/2023 0903   CO2 23 09/01/2023 0903   GLUCOSE 83 09/01/2023 0903   GLUCOSE 88 09/07/2021 1105   BUN 11 09/01/2023 0903   CREATININE 1.03 (H) 09/01/2023 0903   CREATININE 0.90 08/30/2019 1143   CREATININE 0.79 05/30/2015 0922   CALCIUM 8.9 09/01/2023 0903   GFRNONAA >60 09/07/2021 1105   GFRNONAA >60 08/30/2019 1143   GFRAA 80 12/13/2020 0942   GFRAA >60 08/30/2019 1143   Lab Results  Component Value Date   HGBA1C 5.4 09/01/2023   HGBA1C 5.5 05/30/2015   Lab Results  Component Value Date   INSULIN  16.2 04/07/2023   INSULIN  21.4 12/13/2020   Lab Results  Component Value Date   TSH 3.750 09/01/2023   CBC    Component Value Date/Time   WBC 9.3 09/01/2023 0903   WBC 9.9 09/07/2021 1105   RBC 4.28 09/01/2023 0903   RBC  4.58 09/07/2021 1105   HGB 13.4 09/01/2023 0903   HGB 12.0 05/30/2015 0856   HCT 40.3 09/01/2023 0903   PLT 271 09/01/2023 0903   MCV 94 09/01/2023 0903   MCH 31.3 09/01/2023 0903   MCH 31.0 09/07/2021 1105   MCHC 33.3 09/01/2023 0903   MCHC 33.2 09/07/2021 1105   RDW 12.1 09/01/2023 0903   Iron Studies No results found for: IRON, TIBC, FERRITIN, IRONPCTSAT Lipid Panel     Component Value Date/Time   CHOL 157 09/01/2023 0903   TRIG 147 09/01/2023 0903   HDL 39 (L) 09/01/2023 0903    CHOLHDL 4.0 09/01/2023 0903   CHOLHDL 4.5 05/30/2015 0922   VLDL 59 (H) 05/30/2015 0922   LDLCALC 92 09/01/2023 0903   Hepatic Function Panel     Component Value Date/Time   PROT 6.3 09/01/2023 0903   ALBUMIN 4.0 09/01/2023 0903   AST 13 09/01/2023 0903   AST 14 (L) 08/30/2019 1143   ALT 11 09/01/2023 0903   ALT 12 08/30/2019 1143   ALKPHOS 94 09/01/2023 0903   BILITOT 0.5 09/01/2023 0903   BILITOT 0.3 08/30/2019 1143      Component Value Date/Time   TSH 3.750 09/01/2023 0903   TSH 4.580 (H) 12/25/2022 0837   Nutritional Lab Results  Component Value Date   VD25OH 66.1 09/01/2023   VD25OH 69.2 04/07/2023   VD25OH 48.0 10/23/2022     Assessment and Plan    Prediabetes Prediabetes managed with metformin  and lifestyle modifications. Lost one pound over the holidays and follows a category three eating plan 75% of the time but is not currently exercising. Discussed the importance of diet and exercise in preventing diabetes. - Refill metformin  for 90 days - Encourage continuation of diet and exercise regimen - Discuss strategies for incorporating more exercise into daily routine  Obesity Obesity managed with diet and exercise. Lost one pound over the holidays. Financial constraints impact food choices, leading to reliance on less healthy options. Discussed strategies for affordable, healthy eating including incorporating beans for protein and fiber. - Encourage continuation of category three eating plan - Encourage finding affordable exercise options - Follow-up on weight and dietary progress at next visit  Emotional Eating Emotional eating managed with Wellbutrin . Reports doing well on current regimen. Discussed potential need to increase Wellbutrin  dose if emotional eating worsens. - Refill Wellbutrin  for 90 days - Consider increasing Wellbutrin  dose if needed at next visit  Vitamin D  Deficiency Vitamin D  deficiency managed with prescription vitamin D  50,000 IU  weekly. Reports doing well on current regimen. - Refill prescription vitamin D  50,000 IU for 90 days  General Health Maintenance General health maintenance discussed including strategies for affordable, healthy eating and exercise. - Encourage use of dried beans as a cost-effective protein source - Discuss potential use of canned chicken as a less expensive way to get protein temporarily -  Follow-up - Confirm next appointments at checkout, 4 weeks - Schedule April appointment at next visit.        She was informed of the importance of frequent follow up visits to maximize her success with intensive lifestyle modifications for her multiple health conditions.    Sarah Penton, MD

## 2023-12-31 ENCOUNTER — Encounter: Payer: Self-pay | Admitting: Family Medicine

## 2023-12-31 DIAGNOSIS — G43909 Migraine, unspecified, not intractable, without status migrainosus: Secondary | ICD-10-CM

## 2023-12-31 MED ORDER — NURTEC 75 MG PO TBDP: ORAL_TABLET | ORAL | 3 refills | Status: DC

## 2024-01-01 ENCOUNTER — Encounter (INDEPENDENT_AMBULATORY_CARE_PROVIDER_SITE_OTHER): Payer: Self-pay | Admitting: Family Medicine

## 2024-01-05 ENCOUNTER — Ambulatory Visit (INDEPENDENT_AMBULATORY_CARE_PROVIDER_SITE_OTHER): Payer: Commercial Managed Care - HMO | Admitting: Family Medicine

## 2024-01-09 ENCOUNTER — Encounter: Payer: Self-pay | Admitting: Obstetrics and Gynecology

## 2024-01-11 ENCOUNTER — Encounter

## 2024-01-12 NOTE — Progress Notes (Unsigned)
 GYNECOLOGY  VISIT   HPI: 48 y.o.   Divorced  Caucasian female   G2P2002 with Patient's last menstrual period was 01/07/2024.   here for: migraine with menses.  She takes the Micronor for heavy menses.   In June, 2024, her migraines became more regular and more severe.  The last 2 months, has a horrible migraine during her menses.  Nurtec and Maxalt were not helpful.  Her PCP is treating her headaches.  Has seen Dr. Lucia Gaskins for HA and tremor in the past.   She notices some odor along her C/S scars.  She is having some increased moisture in her scar during her menstrual period.  Her skin gets red.   GYNECOLOGIC HISTORY: Patient's last menstrual period was 01/07/2024. Contraception:  BTL/POP Menopausal hormone therapy:  n/a Last 2 paps:  10/15/22 neg: HR HPV neg, 07/02/17 neg: HR HPV neg History of abnormal Pap or positive HPV:  yes, 2000 hx of colposcopy but no treatment to cervix.  Mammogram:  scheduled 2025, 12/23/22 Breast Density Cat A, BI-RADS CAT 1 neg         OB History     Gravida  2   Para  2   Term  2   Preterm      AB      Living  2      SAB      IAB      Ectopic      Multiple      Live Births                 Patient Active Problem List   Diagnosis Date Noted   Other constipation 08/26/2023   TSH elevation 12/25/2022   Obesity, Beginning BMI 50.64 12/25/2022   Nonintractable headache 09/11/2022   Acute migraine 08/25/2022   Family history of colonic polyps 08/25/2022   Stress 07/31/2022   Class 3 severe obesity with serious comorbidity and body mass index (BMI) of 50.0 to 59.9 in adult (HCC) 07/31/2022   B12 deficiency 06/19/2022   Mixed hyperlipidemia 06/19/2022   At risk for impaired metabolic function 05/06/2022   Depression 03/25/2022   Pre-diabetes 02/23/2022   Low HDL (under 40) 09/01/2021   BMI 40.0-44.9, adult (HCC) 09/01/2021   Intractable migraine with aura without status migrainosus 08/04/2021   Insulin resistance  03/21/2021   Vitamin D deficiency 03/21/2021   At risk for malnutrition 03/21/2021   Benign essential tremor 03/13/2020   Asthma 01/19/2020   Hypertriglyceridemia 05/31/2015    Past Medical History:  Diagnosis Date   Allergy    Anxiety    Asthma    Broken foot    Depression    Essential tremor    Gestational diabetes    h/o   History of COVID-19 04/24/2021   Joint pain    Migraines    with aura   Other fatigue    Shortness of breath    Shortness of breath on exertion    Tenosynovitis, de Quervain    Vitamin D deficiency     Past Surgical History:  Procedure Laterality Date   CESAREAN SECTION     2001, 2003   KNEE SURGERY Bilateral    arthroscopic   TUBAL LIGATION     WISDOM TOOTH EXTRACTION      Current Outpatient Medications  Medication Sig Dispense Refill   albuterol (PROVENTIL HFA;VENTOLIN HFA) 108 (90 BASE) MCG/ACT inhaler Inhale 2 puffs into the lungs every 6 (six) hours as needed for wheezing  or shortness of breath.      Azelastine-Fluticasone 137-50 MCG/ACT SUSP Place 1 spray into both nostrils 2 (two) times daily.     buPROPion (WELLBUTRIN SR) 150 MG 12 hr tablet Take 1 tablet (150 mg total) by mouth 2 (two) times daily. 180 tablet 0   EPINEPHrine 0.3 mg/0.3 mL IJ SOAJ injection INJECT IN OUTER THIGH AS NEEDED FOR ANAPHYLAXIS     fluticasone (FLONASE) 50 MCG/ACT nasal spray Place 2 sprays into both nostrils daily.     levocetirizine (XYZAL) 5 MG tablet SMARTSIG:1 Tablet(s) By Mouth Every Evening     Magnesium 400 MG CAPS Take 1 tablet by mouth daily.     metFORMIN (GLUCOPHAGE) 500 MG tablet Take 1 tablet PO bid with meals 180 tablet 0   montelukast (SINGULAIR) 10 MG tablet Take 10 mg by mouth at bedtime.      norethindrone (INCASSIA) 0.35 MG tablet Take 1 tablet (0.35 mg total) by mouth daily. 84 tablet 3   nystatin (MYCOSTATIN/NYSTOP) powder Apply 1 Application topically 3 (three) times daily. Apply to skin of the affected area for up to 7 days 30 g 2    polyethylene glycol (MIRALAX / GLYCOLAX) 17 g packet Take 17 g by mouth daily. With increased water intake. 90 packet 0   primidone (MYSOLINE) 50 MG tablet TAKE 1 TABLET BY MOUTH EVERYDAY AT BEDTIME 90 tablet 4   Rimegepant Sulfate (NURTEC) 75 MG TBDP Take 1 tablet by mouth x 1 dose for acute headache. (Max 75 mg/day) 10 tablet 3   rizatriptan (MAXALT) 10 MG tablet TAKE 1 TABLET BY MOUTH AS NEEDED FOR MIGRAINE. MAY REPEAT IN 2 HOURS IF NEEDED 4 tablet 14   sodium chloride (OCEAN) 0.65 % SOLN nasal spray Place 1 spray into both nostrils as needed for congestion.  0   SYMBICORT 160-4.5 MCG/ACT inhaler      Vitamin D, Ergocalciferol, (DRISDOL) 1.25 MG (50000 UNIT) CAPS capsule Take 1 capsule (50,000 Units total) by mouth every 7 (seven) days. 13 capsule 0   No current facility-administered medications for this visit.     ALLERGIES: Banana, Hydrocodone, Latex, Other, Penicillins, Pineapple, Propranolol, Strawberry extract, Adhesive [tape], and Bactrim [sulfamethoxazole-trimethoprim]  Family History  Problem Relation Age of Onset   Hyperlipidemia Mother    Migraines Mother    Thyroid disease Mother    Glaucoma Mother    Osteoporosis Mother    Anxiety disorder Mother    Seizures Father    Hypertension Father    Cancer Father 59       Prostate Ca   Glaucoma Father    Stroke Maternal Grandmother    Cancer Maternal Grandfather    Migraines Maternal Grandfather    Stroke Paternal Grandfather    Tremor Neg Hx     Social History   Socioeconomic History   Marital status: Divorced    Spouse name: Not on file   Number of children: 2   Years of education: Not on file   Highest education level: Master's degree (e.g., MA, MS, MEng, MEd, MSW, MBA)  Occupational History   Occupation: infant toddler Scientific laboratory technician [employer]  Tobacco Use   Smoking status: Never    Passive exposure: Past   Smokeless tobacco: Never  Vaping Use   Vaping status: Never Used  Substance and Sexual  Activity   Alcohol use: Never    Alcohol/week: 0.0 standard drinks of alcohol   Drug use: Never   Sexual activity: Not Currently    Partners: Male  Birth control/protection: Surgical    Comment: Tubal  Other Topics Concern   Not on file  Social History Narrative   Lives at home with her children   Right handed   Caffeine: 1 glass of tea per day?   Social Drivers of Corporate investment banker Strain: Not on file  Food Insecurity: Not on file  Transportation Needs: Not on file  Physical Activity: Not on file  Stress: Not on file  Social Connections: Not on file  Intimate Partner Violence: Not on file    Review of Systems  All other systems reviewed and are negative.   PHYSICAL EXAMINATION:   BP 130/80 (BP Location: Left Arm, Patient Position: Sitting, Cuff Size: Large)   Ht 5\' 4"  (1.626 m)   Wt 254 lb (115.2 kg)   LMP 01/07/2024   BMI 43.60 kg/m     General appearance: alert, cooperative and appears stated age    Skin: Skin color, texture, turgor normal. No rashes or lesions   ASSESSMENT:  Status post BTL.  On Micronor for menorrhagia.  Menstrual migraine without status migrainosus, intractable.  Patient may be perimenopausal.  Hx C/S x 2.   Probably candida of flexural skin.   PLAN:  We discussed switching from her Micronor to Mirena IUD and supplementing with an estradiol patch during her menstrual week.    Mirena IUD discussed in detail including risks and benefits:  bleeding profiles, perforation with placement, expulsion, ultrasound guided or surgically guided removal, risk of ectopic pregnancy. Benefits of treating heavy and painful menstrual bleeding and use for the progesterone side of HRT also highlighted.   Mirena brochure given.   If she would like to switch to [facility] IUD, I would  plan for Motrin 800 mg or Tylenol 1000 mg an hour before IUD placement, Cytotec 200 mcg pv the night before the insertion and then also the am of the insertion,  paracervical block.   Referral back to Dr. Lucia Gaskins.  Patient is currently still an established patient, so she will also call to request an appointment.  Nystatin powder.   35 min  total time was spent for this patient encounter, including preparation, face-to-face counseling with the patient, coordination of care, and documentation of the encounter.

## 2024-01-26 ENCOUNTER — Encounter: Payer: Self-pay | Admitting: Obstetrics and Gynecology

## 2024-01-26 ENCOUNTER — Ambulatory Visit: Payer: Commercial Managed Care - HMO | Admitting: Obstetrics and Gynecology

## 2024-01-26 VITALS — BP 130/80 | Ht 64.0 in | Wt 254.0 lb

## 2024-01-26 DIAGNOSIS — G43839 Menstrual migraine, intractable, without status migrainosus: Secondary | ICD-10-CM

## 2024-01-26 DIAGNOSIS — N920 Excessive and frequent menstruation with regular cycle: Secondary | ICD-10-CM

## 2024-01-26 MED ORDER — NYSTATIN 100000 UNIT/GM EX POWD: 1.0000 | Freq: Three times a day (TID) | CUTANEOUS | 2 refills | Status: AC

## 2024-01-27 ENCOUNTER — Encounter: Payer: Self-pay | Admitting: Obstetrics and Gynecology

## 2024-01-27 NOTE — Telephone Encounter (Signed)
 Dr. Edward Jolly -per review of referral placed on 01/26/24, unable to schedule until OV note completed. Please advise once completed.

## 2024-01-29 ENCOUNTER — Inpatient Hospital Stay: Payer: BLUE CROSS/BLUE SHIELD

## 2024-02-01 ENCOUNTER — Encounter

## 2024-02-02 ENCOUNTER — Ambulatory Visit (INDEPENDENT_AMBULATORY_CARE_PROVIDER_SITE_OTHER): Payer: Commercial Managed Care - HMO | Admitting: Family Medicine

## 2024-02-02 ENCOUNTER — Encounter (INDEPENDENT_AMBULATORY_CARE_PROVIDER_SITE_OTHER): Payer: Self-pay | Admitting: Family Medicine

## 2024-02-02 VITALS — BP 131/87 | HR 86 | Temp 98.2°F | Ht 64.0 in | Wt 252.0 lb

## 2024-02-02 DIAGNOSIS — E669 Obesity, unspecified: Secondary | ICD-10-CM

## 2024-02-02 DIAGNOSIS — Z6841 Body Mass Index (BMI) 40.0 and over, adult: Secondary | ICD-10-CM

## 2024-02-02 DIAGNOSIS — F5089 Other specified eating disorder: Secondary | ICD-10-CM

## 2024-02-02 DIAGNOSIS — R7303 Prediabetes: Secondary | ICD-10-CM

## 2024-02-02 DIAGNOSIS — E559 Vitamin D deficiency, unspecified: Secondary | ICD-10-CM

## 2024-02-02 DIAGNOSIS — F3289 Other specified depressive episodes: Secondary | ICD-10-CM

## 2024-02-02 MED ORDER — VITAMIN D (ERGOCALCIFEROL) 1.25 MG (50000 UNIT) PO CAPS: 50000.0000 [IU] | ORAL_CAPSULE | ORAL | 0 refills | Status: DC

## 2024-02-02 MED ORDER — METFORMIN HCL 500 MG PO TABS: ORAL_TABLET | ORAL | 0 refills | Status: DC

## 2024-02-02 MED ORDER — BUPROPION HCL ER (SR) 150 MG PO TB12: 150.0000 mg | ORAL_TABLET | Freq: Two times a day (BID) | ORAL | 0 refills | Status: DC

## 2024-02-02 NOTE — Progress Notes (Signed)
 Office: [phone]  /  Fax: [phone]  WEIGHT SUMMARY AND BIOMETRICS  Anthropometric Measurements Height: 5\' 4"  (1.626 m) Weight: 252 lb (114.3 kg) BMI (Calculated): 43.23 Weight at Last Visit: 255 lb Weight Lost Since Last Visit: 3 lb Weight Gained Since Last Visit: 0 Starting Weight: 295 lb Total Weight Loss (lbs): 43 lb (19.5 kg) Peak Weight: 300 lb   Body Composition  Body Fat %: 49.2 % Fat Mass (lbs): 124 lbs Muscle Mass (lbs): 121.2 lbs Total Body Water (lbs): 91.8 lbs Visceral Fat Rating : 15   Other Clinical Data Fasting: no Labs: no Today's Visit #: 50 Starting Date: 12/13/20    Chief Complaint: OBESITY    History of Present Illness   The patient presents for obesity treatment and monitoring, vitamin D deficiency management, and prediabetes follow-up.  She is focused on obesity treatment and monitoring her progress. She follows a category three eating plan about 80% of the time and exercises for 45 minutes once a week. Over the last two months, she has lost three pounds. She is working on recognizing and modifying emotional eating behaviors and takes bupropion SR 150 mg twice daily to assist with this.  She is managing prediabetes through diet, exercise, and weight loss. She takes metformin 500 mg twice daily with meals and requests a refill.  She is addressing her vitamin D deficiency with prescription vitamin D 50,000 IU weekly and requests a refill.  She experiences migraines, which she attributes to hormonal changes. She has consulted a gynecologist and is awaiting an appointment with a neurologist.  She crochets to help avoid snacking at night and has been dealing with financial issues, which have recently started to improve.          PHYSICAL EXAM:  Blood pressure 131/87, pulse 86, temperature 98.2 F (36.8 C), height 5\' 4"  (1.626 m), weight 252 lb (114.3 kg), last menstrual period 01/07/2024, SpO2 99%. Body mass index is 43.26  kg/m.  DIAGNOSTIC DATA REVIEWED:  BMET    Component Value Date/Time   NA 140 09/01/2023 0903   K 4.8 09/01/2023 0903   CL 104 09/01/2023 0903   CO2 23 09/01/2023 0903   GLUCOSE 83 09/01/2023 0903   GLUCOSE 88 09/07/2021 1105   BUN 11 09/01/2023 0903   CREATININE 1.03 (H) 09/01/2023 0903   CREATININE 0.90 08/30/2019 1143   CREATININE 0.79 05/30/2015 0922   CALCIUM 8.9 09/01/2023 0903   GFRNONAA >60 09/07/2021 1105   GFRNONAA >60 08/30/2019 1143   GFRAA 80 12/13/2020 0942   GFRAA >60 08/30/2019 1143   Lab Results  Component Value Date   HGBA1C 5.4 09/01/2023   HGBA1C 5.5 05/30/2015   Lab Results  Component Value Date   INSULIN 16.2 04/07/2023   INSULIN 21.4 12/13/2020   Lab Results  Component Value Date   TSH 3.750 09/01/2023   CBC    Component Value Date/Time   WBC 9.3 09/01/2023 0903   WBC 9.9 09/07/2021 1105   RBC 4.28 09/01/2023 0903   RBC 4.58 09/07/2021 1105   HGB 13.4 09/01/2023 0903   HGB 12.0 05/30/2015 0856   HCT 40.3 09/01/2023 0903   PLT 271 09/01/2023 0903   MCV 94 09/01/2023 0903   MCH 31.3 09/01/2023 0903   MCH 31.0 09/07/2021 1105   MCHC 33.3 09/01/2023 0903   MCHC 33.2 09/07/2021 1105   RDW 12.1 09/01/2023 0903   Iron Studies No results found for: "IRON", "TIBC", "FERRITIN", "IRONPCTSAT" Lipid Panel  Component Value Date/Time   CHOL 157 09/01/2023 0903   TRIG 147 09/01/2023 0903   HDL 39 (L) 09/01/2023 0903   CHOLHDL 4.0 09/01/2023 0903   CHOLHDL 4.5 05/30/2015 0922   VLDL 59 (H) 05/30/2015 0922   LDLCALC 92 09/01/2023 0903   Hepatic Function Panel     Component Value Date/Time   PROT 6.3 09/01/2023 0903   ALBUMIN 4.0 09/01/2023 0903   AST 13 09/01/2023 0903   AST 14 (L) 08/30/2019 1143   ALT 11 09/01/2023 0903   ALT 12 08/30/2019 1143   ALKPHOS 94 09/01/2023 0903   BILITOT 0.5 09/01/2023 0903   BILITOT 0.3 08/30/2019 1143      Component Value Date/Time   TSH 3.750 09/01/2023 0903   TSH 4.580 (H) 12/25/2022 0837    Nutritional Lab Results  Component Value Date   VD25OH 66.1 09/01/2023   VD25OH 69.2 04/07/2023   VD25OH 48.0 10/23/2022     Assessment and Plan    Obesity She adheres to a category three eating plan 80% of the time and exercises for 45 minutes weekly, resulting in a three-pound weight loss over two months. She is addressing emotional eating behaviors and was advised on the importance of protein intake and the thermogenic effect of food. Emphasized benefits of protein from whole foods over supplements, which may impede weight loss if overused. Premier Protein and Fairlife shakes are convenient but should be emergency options. Unjury protein powder is recommended for its absorption and amino acid balance. - Continue category three eating plan - Increase exercise frequency - Prioritize protein intake from whole foods - Use protein supplements as emergency options - Monitor weight loss progress  Emotional Eating Behaviors She is on bupropion SR 150 mg BID to manage emotional eating and is actively developing strategies to modify these behaviors. - Continue bupropion SR 150 mg BID - Continue developing strategies to modify emotional eating behaviors  Prediabetes She is on metformin 500 mg BID with meals and is managing her condition through diet, exercise, and weight loss. Metformin is cost-effective and may be cheaper without insurance at certain pharmacies. - Continue metformin 500 mg BID with meals - Maintain diet and exercise regimen - Monitor blood glucose levels  Vitamin D Deficiency She is on prescription vitamin D 50,000 IU weekly and requests a refill to continue treatment. - Continue vitamin D 50,000 IU weekly - Refill prescription through [facility] pharmacy  Follow-up She has a follow-up appointment scheduled for March 15, 2024, and plans to schedule the subsequent appointment. - Attend follow-up appointment on March 15, 2024 - Schedule the next appointment after March 15, 2024        She was informed of the importance of frequent follow up visits to maximize her success with intensive lifestyle modifications for her multiple health conditions.    Quillian Quince, MD

## 2024-02-23 ENCOUNTER — Other Ambulatory Visit: Payer: Managed Care, Other (non HMO)

## 2024-03-01 ENCOUNTER — Ambulatory Visit: Payer: Managed Care, Other (non HMO) | Admitting: Family Medicine

## 2024-03-04 ENCOUNTER — Other Ambulatory Visit: Payer: Self-pay | Admitting: *Deleted

## 2024-03-04 DIAGNOSIS — R7303 Prediabetes: Secondary | ICD-10-CM

## 2024-03-04 DIAGNOSIS — R7989 Other specified abnormal findings of blood chemistry: Secondary | ICD-10-CM

## 2024-03-04 DIAGNOSIS — E782 Mixed hyperlipidemia: Secondary | ICD-10-CM

## 2024-03-08 ENCOUNTER — Ambulatory Visit: Payer: Managed Care, Other (non HMO) | Admitting: Family Medicine

## 2024-03-10 ENCOUNTER — Other Ambulatory Visit: Payer: Managed Care, Other (non HMO)

## 2024-03-10 DIAGNOSIS — R7989 Other specified abnormal findings of blood chemistry: Secondary | ICD-10-CM

## 2024-03-10 DIAGNOSIS — R7303 Prediabetes: Secondary | ICD-10-CM

## 2024-03-10 DIAGNOSIS — E782 Mixed hyperlipidemia: Secondary | ICD-10-CM

## 2024-03-11 LAB — CBC WITH DIFFERENTIAL/PLATELET
Basophils Absolute: 0.1 10*3/uL (ref 0.0–0.2)
Basos: 1 %
EOS (ABSOLUTE): 0.4 10*3/uL (ref 0.0–0.4)
Eos: 3 %
Hematocrit: 41.6 % (ref 34.0–46.6)
Hemoglobin: 13.6 g/dL (ref 11.1–15.9)
Immature Grans (Abs): 0.2 10*3/uL — ABNORMAL HIGH (ref 0.0–0.1)
Immature Granulocytes: 1 %
Lymphocytes Absolute: 3 10*3/uL (ref 0.7–3.1)
Lymphs: 27 %
MCH: 30.5 pg (ref 26.6–33.0)
MCHC: 32.7 g/dL (ref 31.5–35.7)
MCV: 93 fL (ref 79–97)
Monocytes Absolute: 0.7 10*3/uL (ref 0.1–0.9)
Monocytes: 6 %
Neutrophils Absolute: 7 10*3/uL (ref 1.4–7.0)
Neutrophils: 62 %
Platelets: 280 10*3/uL (ref 150–450)
RBC: 4.46 x10E6/uL (ref 3.77–5.28)
RDW: 12 % (ref 11.7–15.4)
WBC: 11.3 10*3/uL — ABNORMAL HIGH (ref 3.4–10.8)

## 2024-03-11 LAB — COMPREHENSIVE METABOLIC PANEL WITH GFR
ALT: 8 IU/L (ref 0–32)
AST: 14 IU/L (ref 0–40)
Albumin: 4.2 g/dL (ref 3.9–4.9)
Alkaline Phosphatase: 116 IU/L (ref 44–121)
BUN/Creatinine Ratio: 12 (ref 9–23)
BUN: 12 mg/dL (ref 6–24)
Bilirubin Total: 0.4 mg/dL (ref 0.0–1.2)
CO2: 24 mmol/L (ref 20–29)
Calcium: 9.3 mg/dL (ref 8.7–10.2)
Chloride: 101 mmol/L (ref 96–106)
Creatinine, Ser: 0.98 mg/dL (ref 0.57–1.00)
Globulin, Total: 2.4 g/dL (ref 1.5–4.5)
Glucose: 79 mg/dL (ref 70–99)
Potassium: 4.7 mmol/L (ref 3.5–5.2)
Sodium: 136 mmol/L (ref 134–144)
Total Protein: 6.6 g/dL (ref 6.0–8.5)
eGFR: 71 mL/min/{1.73_m2} (ref >59)

## 2024-03-11 LAB — TSH: TSH: 4.26 u[IU]/mL (ref 0.450–4.500)

## 2024-03-11 LAB — HEMOGLOBIN A1C
Est. average glucose Bld gHb Est-mCnc: 108 mg/dL
Hgb A1c MFr Bld: 5.4 % (ref 4.8–5.6)

## 2024-03-11 LAB — LIPID PANEL
Chol/HDL Ratio: 3.7 ratio (ref 0.0–4.4)
Cholesterol, Total: 166 mg/dL (ref 100–199)
HDL: 45 mg/dL (ref >39)
LDL Chol Calc (NIH): 92 mg/dL (ref 0–99)
Triglycerides: 166 mg/dL — ABNORMAL HIGH (ref 0–149)
VLDL Cholesterol Cal: 29 mg/dL (ref 5–40)

## 2024-03-15 ENCOUNTER — Encounter (INDEPENDENT_AMBULATORY_CARE_PROVIDER_SITE_OTHER): Payer: Self-pay | Admitting: Family Medicine

## 2024-03-15 ENCOUNTER — Encounter (INDEPENDENT_AMBULATORY_CARE_PROVIDER_SITE_OTHER): Payer: Commercial Managed Care - HMO | Admitting: Family Medicine

## 2024-03-21 NOTE — Progress Notes (Signed)
 error

## 2024-03-22 ENCOUNTER — Ambulatory Visit: Payer: Managed Care, Other (non HMO) | Admitting: Family Medicine

## 2024-03-22 VITALS — BP 125/84 | HR 99 | Ht 64.0 in | Wt 257.1 lb

## 2024-03-22 DIAGNOSIS — G43909 Migraine, unspecified, not intractable, without status migrainosus: Secondary | ICD-10-CM

## 2024-03-22 DIAGNOSIS — G250 Essential tremor: Secondary | ICD-10-CM | POA: Diagnosis not present

## 2024-03-22 DIAGNOSIS — G43119 Migraine with aura, intractable, without status migrainosus: Secondary | ICD-10-CM | POA: Diagnosis not present

## 2024-03-22 MED ORDER — NURTEC 75 MG PO TBDP: ORAL_TABLET | ORAL | 5 refills | Status: DC

## 2024-03-22 MED ORDER — RIZATRIPTAN BENZOATE 10 MG PO TABS: 10.0000 mg | ORAL_TABLET | ORAL | 5 refills | Status: AC | PRN
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
## 2024-03-22 NOTE — Patient Instructions (Addendum)
 It was nice to see you today,  We addressed the following topics today: -I have sent in refills of your Nurtec and rizatriptan  to [facility]. - We will refill your primidone  when it is due. - We will follow-up with you in 6 months for your physical.  Have a great day,  Etha Henle, MD

## 2024-03-27 NOTE — Assessment & Plan Note (Signed)
 Refilling nurtec and rizatriptan .

## 2024-03-27 NOTE — Assessment & Plan Note (Signed)
 Continue primidone

## 2024-04-05 ENCOUNTER — Other Ambulatory Visit: Payer: Self-pay | Admitting: Family Medicine

## 2024-04-05 ENCOUNTER — Ambulatory Visit
Admission: RE | Admit: 2024-04-05 | Discharge: 2024-04-05 | Disposition: A | Discharge status: Home / Self Care | Source: Ambulatory Visit | Attending: Family Medicine

## 2024-04-05 DIAGNOSIS — Z1231 Encounter for screening mammogram for malignant neoplasm of breast: Secondary | ICD-10-CM

## 2024-04-06 ENCOUNTER — Encounter

## 2024-04-08 ENCOUNTER — Emergency Department (HOSPITAL_COMMUNITY)

## 2024-04-08 ENCOUNTER — Emergency Department (HOSPITAL_COMMUNITY)
Admission: EM | Admit: 2024-04-08 | Discharge: 2024-04-08 | Disposition: A | Discharge status: Home / Self Care | Attending: Emergency Medicine | Admitting: Emergency Medicine

## 2024-04-08 ENCOUNTER — Other Ambulatory Visit: Payer: Self-pay

## 2024-04-08 DIAGNOSIS — Z91040 Latex allergy status: Secondary | ICD-10-CM | POA: Insufficient documentation

## 2024-04-08 DIAGNOSIS — L03114 Cellulitis of left upper limb: Secondary | ICD-10-CM | POA: Insufficient documentation

## 2024-04-08 DIAGNOSIS — J45909 Unspecified asthma, uncomplicated: Secondary | ICD-10-CM | POA: Diagnosis not present

## 2024-04-08 NOTE — Discharge Instructions (Signed)
 Wear the brace all the time except when bathing.  Avoid using that hand when possible.  Finish your antibiotics.  Return if it gets a lot worse over the weekend.  It is ok to take tylenol  and ibuprofen  as needed for pain

## 2024-04-08 NOTE — ED Provider Notes (Signed)
 [facility] Provider Note   CSN: [id number] Arrival date & time: 04/08/24  0830     History  Chief Complaint  Patient presents with   Hand Problem    Sarah Fields is a 48 y.o. female.  Patient is a 48 year old female with a history of depression, asthma, prior tenosynovitis who is presenting today with pain in the left thumb.  She reports earlier this week she woke up on Monday and had redness, warmth and swelling in her left thumb.  It progressed throughout the day and the following day so she saw her doctor on Wednesday and there was concern for infection.  She was started on an antibiotic which she has taken for the last 2 days for total doses.  She reports that overall her thumb feels better she is now able to range it and the redness is gone away but now she feels pain in her thenar eminence radiating up into her wrist.  She has not noticed these areas being red but maybe slightly swollen.  She does not have a history of diabetes, does not take any immune altering medications such as immunotherapy or chemotherapy.  She does recently report that she got a new kitten but had not noticed a particular scratch or bite to that hand.  The history is provided by the patient.       Home Medications Prior to Admission medications   Medication Sig Start Date End Date Taking? Authorizing Provider  albuterol  (PROVENTIL  HFA;VENTOLIN  HFA) 108 (90 BASE) MCG/ACT inhaler Inhale 2 puffs into the lungs every 6 (six) hours as needed for wheezing or shortness of breath.     [provider]  Azelastine-Fluticasone  137-50 MCG/ACT SUSP Place 1 spray into both nostrils 2 (two) times daily. 03/09/20   [provider]  buPROPion  (WELLBUTRIN  SR) 150 MG 12 hr tablet Take 1 tablet (150 mg total) by mouth 2 (two) times daily. 02/02/24   Jasmine Mesi D, MD  EPINEPHrine 0.3 mg/0.3 mL IJ SOAJ injection INJECT IN OUTER THIGH AS NEEDED FOR  ANAPHYLAXIS 02/15/20   [provider]  fluticasone  (FLONASE ) 50 MCG/ACT nasal spray Place 2 sprays into both nostrils daily.    [provider]  levocetirizine (XYZAL) 5 MG tablet SMARTSIG:1 Tablet(s) By Mouth Every Evening 02/09/20   [provider]  Magnesium  400 MG CAPS Take 1 tablet by mouth daily.    [provider]  metFORMIN  (GLUCOPHAGE ) 500 MG tablet Take 1 tablet PO bid with meals 02/02/24   Jasmine Mesi D, MD  montelukast  (SINGULAIR ) 10 MG tablet Take 10 mg by mouth at bedtime.     [provider]  norethindrone  (INCASSIA ) 0.35 MG tablet Take 1 tablet (0.35 mg total) by mouth daily. 10/20/23   Amundson C Silva, Brook E, MD  nystatin  (MYCOSTATIN /NYSTOP ) powder Apply 1 Application topically 3 (three) times daily. Apply to skin of the affected area for up to 7 days 01/26/24   Amundson C Silva, Brook E, MD  polyethylene glycol (MIRALAX  / GLYCOLAX ) 17 g packet Take 17 g by mouth daily. With increased water intake. 08/26/23   Jasmine Mesi D, MD  primidone  (MYSOLINE ) 50 MG tablet TAKE 1 TABLET BY MOUTH EVERYDAY AT BEDTIME 12/25/21   Glory Larsen, MD  Rimegepant Sulfate (NURTEC) 75 MG TBDP Take 1 tablet by mouth x 1 dose for acute headache. (Max 75 mg/day) 03/22/24   Laneta Pintos, MD  rizatriptan  (MAXALT ) 10 MG tablet Take  1 tablet (10 mg total) by mouth as needed for migraine. TAKE 1 TABLET BY MOUTH AS NEEDED FOR MIGRAINE. MAY REPEAT IN 2 HOURS IF NEEDED 03/22/24   Laneta Pintos, MD  sodium chloride  (OCEAN) 0.65 % SOLN nasal spray Place 1 spray into both nostrils as needed for congestion. 01/20/20   Vann, Jessica U, DO  SYMBICORT 160-4.5 MCG/ACT inhaler  03/09/20   [provider]  Vitamin D , Ergocalciferol , (DRISDOL ) 1.25 MG (50000 UNIT) CAPS capsule Take 1 capsule (50,000 Units total) by mouth every 7 (seven) days. 02/02/24   Jasmine Mesi D, MD      Allergies    Banana, Hydrocodone , Latex, Other, Penicillins, Pineapple, Propranolol ,  Strawberry extract, Adhesive [tape], and Bactrim [sulfamethoxazole-trimethoprim]    Review of Systems   Review of Systems  Physical Exam Updated Vital Signs BP (!) 155/98 (BP Location: Right Arm)   Pulse 88   Temp 98.7 F (37.1 C)   Resp 19   LMP 03/18/2024   SpO2 99%  Physical Exam Vitals and nursing note reviewed.  Cardiovascular:     Pulses: Normal pulses.  Pulmonary:     Effort: Pulmonary effort is normal.  Musculoskeletal:        General: Tenderness present.     Comments: Full range of motion to the left thumb, mild tenderness in the hypothenar eminence and the radial portion of the wrist but full range of motion.  Slight swelling noted but no erythema, induration.  No tenderness of the left thumb at this time.  Neurological:     Mental Status: She is alert.     ED Results / Procedures / Treatments   Labs (all labs ordered are listed, but only abnormal results are displayed) Labs Reviewed - No data to display  EKG None  Radiology DG Hand Complete Left Result Date: 04/08/2024 CLINICAL DATA:  Left thumb swelling and pain for several days. EXAM: LEFT HAND - COMPLETE 3+ VIEW COMPARISON:  None Available. FINDINGS: There is no evidence of fracture or dislocation. No No evidence of osteolysis or periostitis. No evidence of arthropathy or other focal bone abnormality. Soft tissues are unremarkable. IMPRESSION: Negative. Electronically Signed   By: Marlyce Sine M.D.   On: 04/08/2024 09:21    Procedures Procedures    Medications Ordered in ED Medications - No data to display  ED Course/ Medical Decision Making/ A&P                                 Medical Decision Making Amount and/or Complexity of Data Reviewed Radiology: ordered and independent interpretation performed. Decision-making details documented in ED Course.   Patient presenting today with the above complaints.  Suspect either inflammatory causes versus a possible cellulitis which is in the process of  being treated.  She has had 2 days of antibiotics and reports overall symptoms are improving but now having pain radiating up into the thenar eminence and wrist.  There is no erythema or tracking.  No warmth noted to the joint.  She has full range of motion and low suspicion for tenosynovitis.  No evidence of felon or paronychia.  Encouraged patient to continue her antibiotic.  Will immobilize in a thumb spica.  Low suspicion at this time for DVT or gout.  I have independently visualized and interpreted pt's images today.  Hand imaging was negative.         Final Clinical Impression(s) / ED Diagnoses  Final diagnoses:  Cellulitis of left hand    Rx / DC Orders ED Discharge Orders     None         Almond Army, MD 04/08/24 [phone]

## 2024-04-08 NOTE — Progress Notes (Signed)
 Orthopedic Tech Progress Note Patient Details:  Sarah Fields January 30, 1976 [id number]  Ortho Devices Type of Ortho Device: Thumb velcro splint Ortho Device/Splint Location: LUE Ortho Device/Splint Interventions: Ordered, Application   Post Interventions Patient Tolerated: Well  [name] 04/08/2024, 11:19 AM

## 2024-04-08 NOTE — ED Triage Notes (Signed)
 Patient arrives stating L thumb swollen since Wednesday. Went to [place] walk in clinic and started on abx and was told to come to ED if swelling worsened or extended up arm. Denies injury to thumb.

## 2024-04-12 ENCOUNTER — Ambulatory Visit (INDEPENDENT_AMBULATORY_CARE_PROVIDER_SITE_OTHER): Admitting: Family Medicine

## 2024-04-12 ENCOUNTER — Encounter (INDEPENDENT_AMBULATORY_CARE_PROVIDER_SITE_OTHER): Payer: Self-pay | Admitting: Family Medicine

## 2024-04-12 ENCOUNTER — Other Ambulatory Visit (INDEPENDENT_AMBULATORY_CARE_PROVIDER_SITE_OTHER): Payer: Self-pay | Admitting: Family Medicine

## 2024-04-12 VITALS — BP 129/85 | HR 85 | Temp 99.0°F | Ht 64.0 in | Wt 255.0 lb

## 2024-04-12 DIAGNOSIS — R7303 Prediabetes: Secondary | ICD-10-CM

## 2024-04-12 DIAGNOSIS — F3289 Other specified depressive episodes: Secondary | ICD-10-CM

## 2024-04-12 DIAGNOSIS — Z6841 Body Mass Index (BMI) 40.0 and over, adult: Secondary | ICD-10-CM

## 2024-04-12 DIAGNOSIS — E559 Vitamin D deficiency, unspecified: Secondary | ICD-10-CM | POA: Diagnosis not present

## 2024-04-12 DIAGNOSIS — E669 Obesity, unspecified: Secondary | ICD-10-CM

## 2024-04-12 DIAGNOSIS — F5089 Other specified eating disorder: Secondary | ICD-10-CM

## 2024-04-12 MED ORDER — METFORMIN HCL ER 750 MG PO TB24
750.0000 mg | ORAL_TABLET | Freq: Every morning | ORAL | 0 refills | Status: AC
Start: 2024-04-12 — End: ?

## 2024-04-12 MED ORDER — VITAMIN D (ERGOCALCIFEROL) 1.25 MG (50000 UNIT) PO CAPS: 50000.0000 [IU] | ORAL_CAPSULE | ORAL | 0 refills | Status: DC

## 2024-04-12 MED ORDER — BUPROPION HCL ER (SR) 200 MG PO TB12: 200.0000 mg | ORAL_TABLET | Freq: Every morning | ORAL | 0 refills | Status: DC

## 2024-04-12 NOTE — Progress Notes (Signed)
 Office: [phone]  /  Fax: [phone]  WEIGHT SUMMARY AND BIOMETRICS  Anthropometric Measurements Height: 5\' 4"  (1.626 m) Weight: 255 lb (115.7 kg) BMI (Calculated): 43.75 Weight at Last Visit: 252 lb Weight Lost Since Last Visit: 0 Weight Gained Since Last Visit: 3 lb Starting Weight: 295 lb Total Weight Loss (lbs): 40 lb (18.1 kg) Peak Weight: 297 lb   Body Composition  Body Fat %: 48.9 % Fat Mass (lbs): 124.6 lbs Muscle Mass (lbs): 123.8 lbs Total Body Water (lbs): 90.6 lbs Visceral Fat Rating : 16   Other Clinical Data Fasting: yes Labs: no Today's Visit #: 52 Starting Date: 12/13/20    Chief Complaint: OBESITY    History of Present Illness Sarah Fields is a 48 year old female who presents to discuss her obesity treatment and assess her progress.  She is following her category three eating plan about fifty percent of the time and is not currently exercising. She has gained three pounds over the last two months, which may be due to fluid retention. She has a history of vitamin D  deficiency and is on prescription vitamin D , requesting a refill.  She has a history of emotional eating behaviors, treated with Wellbutrin , 150 mg BID SR version. She has difficulty remembering to take the second dose. She also has a history of prediabetes, managed with metformin , 500 mg with meals, three times a day, and reports difficulty remembering the afternoon and evening doses. Her recent A1c was 5.4.  She experiences hot flashes and feeling cold, requiring her to carry a sweater. She reports an infection in her thumb that began last week, leading to difficulty bending it. Initially treated with amoxicillin clavulanic acid (Augmentin) at urgent care, the infection spread to her wrist, prompting a visit to the ER where she was given a splint and advised to limit thumb movement. She is currently not wearing the splint and has three more antibiotic pills to take.  She  has an upcoming orthopedic appointment for her knee. She has seen the orthopedic team before but is unsure if it was for her left knee. She mentions her current knee brace is old and does not fit well.  She reports a recent lab workup at her primary care provider, showing a slightly elevated white blood cell count without symptoms at the time. Her A1c was 5.4, and her TSH was normal. She reports difficulty remembering to take her medications regularly, particularly the afternoon and evening doses of metformin  and Wellbutrin , despite trying various strategies.      PHYSICAL EXAM:  Blood pressure 129/85, pulse 85, temperature 99 F (37.2 C), height 5\' 4"  (1.626 m), weight 255 lb (115.7 kg), last menstrual period 03/18/2024, SpO2 97%. Body mass index is 43.77 kg/m.  DIAGNOSTIC DATA REVIEWED:  BMET    Component Value Date/Time   NA 136 03/10/2024 1119   K 4.7 03/10/2024 1119   CL 101 03/10/2024 1119   CO2 24 03/10/2024 1119   GLUCOSE 79 03/10/2024 1119   GLUCOSE 88 09/07/2021 1105   BUN 12 03/10/2024 1119   CREATININE 0.98 03/10/2024 1119   CREATININE 0.90 08/30/2019 1143   CREATININE 0.79 05/30/2015 0922   CALCIUM 9.3 03/10/2024 1119   GFRNONAA >60 09/07/2021 1105   GFRNONAA >60 08/30/2019 1143   GFRAA 80 12/13/2020 0942   GFRAA >60 08/30/2019 1143   Lab Results  Component Value Date   HGBA1C 5.4 03/10/2024   HGBA1C 5.5 05/30/2015   Lab Results  Component Value  Date   INSULIN  16.2 04/07/2023   INSULIN  21.4 12/13/2020   Lab Results  Component Value Date   TSH 4.260 03/10/2024   CBC    Component Value Date/Time   WBC 11.3 (H) 03/10/2024 1119   WBC 9.9 09/07/2021 1105   RBC 4.46 03/10/2024 1119   RBC 4.58 09/07/2021 1105   HGB 13.6 03/10/2024 1119   HGB 12.0 05/30/2015 0856   HCT 41.6 03/10/2024 1119   PLT 280 03/10/2024 1119   MCV 93 03/10/2024 1119   MCH 30.5 03/10/2024 1119   MCH 31.0 09/07/2021 1105   MCHC 32.7 03/10/2024 1119   MCHC 33.2 09/07/2021 1105    RDW 12.0 03/10/2024 1119   Iron Studies No results found for: "IRON", "TIBC", "FERRITIN", "IRONPCTSAT" Lipid Panel     Component Value Date/Time   CHOL 166 03/10/2024 1119   TRIG 166 (H) 03/10/2024 1119   HDL 45 03/10/2024 1119   CHOLHDL 3.7 03/10/2024 1119   CHOLHDL 4.5 05/30/2015 0922   VLDL 59 (H) 05/30/2015 0922   LDLCALC 92 03/10/2024 1119   Hepatic Function Panel     Component Value Date/Time   PROT 6.6 03/10/2024 1119   ALBUMIN 4.2 03/10/2024 1119   AST 14 03/10/2024 1119   AST 14 (L) 08/30/2019 1143   ALT 8 03/10/2024 1119   ALT 12 08/30/2019 1143   ALKPHOS 116 03/10/2024 1119   BILITOT 0.4 03/10/2024 1119   BILITOT 0.3 08/30/2019 1143      Component Value Date/Time   TSH 4.260 03/10/2024 1119   Nutritional Lab Results  Component Value Date   VD25OH 66.1 09/01/2023   VD25OH 69.2 04/07/2023   VD25OH 48.0 10/23/2022     Assessment and Plan Assessment & Plan Obesity Following category three eating plan 50% of the time, not exercising, and gained three pounds over two months, likely due to fluid retention. Emphasized protein intake and muscle challenge to maintain muscle mass during weight loss. - Continue category three eating plan - Increase exercise as tolerated - Ensure adequate protein intake - Consider weighing protein portions occasionally for accuracy  Emotional eating behaviors Managed with Wellbutrin  SR 150 mg BID, but has difficulty remembering the second dose. Discussed changing dosing to once daily to improve adherence. - Change Wellbutrin  SR to 200 mg once daily  Prediabetes Managed with metformin  500 mg TID, but has difficulty remembering the afternoon/evening dose. Recent labs show an A1c of 5.4%. Discussed changing dosing to once daily to improve adherence. - Change metformin  to 750 mg once daily  Vitamin D  deficiency Currently on prescription vitamin D  and requests a refill. - Refill prescription vitamin D       She was informed  of the importance of frequent follow up visits to maximize her success with intensive lifestyle modifications for her multiple health conditions.    Jasmine Mesi, MD

## 2024-04-20 ENCOUNTER — Inpatient Hospital Stay: Admit: 2024-04-20 | Payer: BLUE CROSS/BLUE SHIELD

## 2024-04-20 VITALS — Ht 66.0 in | Wt 132.0 lb

## 2024-04-20 DIAGNOSIS — R928 Other abnormal and inconclusive findings on diagnostic imaging of breast: Secondary | ICD-10-CM

## 2024-05-11 ENCOUNTER — Encounter (INDEPENDENT_AMBULATORY_CARE_PROVIDER_SITE_OTHER): Payer: Self-pay | Admitting: Family Medicine

## 2024-05-11 ENCOUNTER — Ambulatory Visit (INDEPENDENT_AMBULATORY_CARE_PROVIDER_SITE_OTHER): Admitting: Family Medicine

## 2024-05-11 VITALS — BP 124/86 | HR 94 | Temp 98.0°F | Ht 64.0 in | Wt 255.0 lb

## 2024-05-11 DIAGNOSIS — E559 Vitamin D deficiency, unspecified: Secondary | ICD-10-CM | POA: Diagnosis not present

## 2024-05-11 DIAGNOSIS — E669 Obesity, unspecified: Secondary | ICD-10-CM

## 2024-05-11 DIAGNOSIS — Z6841 Body Mass Index (BMI) 40.0 and over, adult: Secondary | ICD-10-CM

## 2024-05-11 DIAGNOSIS — R7303 Prediabetes: Secondary | ICD-10-CM | POA: Diagnosis not present

## 2024-05-11 DIAGNOSIS — F3289 Other specified depressive episodes: Secondary | ICD-10-CM

## 2024-05-11 MED ORDER — VITAMIN D (ERGOCALCIFEROL) 1.25 MG (50000 UNIT) PO CAPS
50000.0000 [IU] | ORAL_CAPSULE | ORAL | 0 refills | Status: DC
Start: 2024-05-11 — End: 2024-06-21

## 2024-05-11 MED ORDER — BUPROPION HCL ER (SR) 200 MG PO TB12
200.0000 mg | ORAL_TABLET | Freq: Every morning | ORAL | 0 refills | Status: DC
Start: 2024-05-11 — End: 2024-06-21

## 2024-05-11 MED ORDER — SEMAGLUTIDE-WEIGHT MANAGEMENT 0.25 MG/0.5ML ~~LOC~~ SOAJ: 0.2500 mg | SUBCUTANEOUS | 0 refills | Status: DC

## 2024-05-11 MED ORDER — METFORMIN HCL ER 750 MG PO TB24: 750.0000 mg | ORAL_TABLET | Freq: Every morning | ORAL | 0 refills | Status: DC

## 2024-05-11 NOTE — Progress Notes (Signed)
 Office: [phone]  /  Fax: [phone]  WEIGHT SUMMARY AND BIOMETRICS  Anthropometric Measurements Height: 5' 4 (1.626 m) Weight: 255 lb (115.7 kg) BMI (Calculated): 43.75 Weight at Last Visit: 255 lb Weight Lost Since Last Visit: 0 Weight Gained Since Last Visit: 0 Starting Weight: 295 lb Total Weight Loss (lbs): 40 lb (18.1 kg) Peak Weight: 297 lb   Body Composition  Body Fat %: 49.7 % Fat Mass (lbs): 126.8 lbs Muscle Mass (lbs): 121.8 lbs Total Body Water (lbs): 92 lbs Visceral Fat Rating : 16   Other Clinical Data Fasting: no Labs: no Today's Visit #: 53 Starting Date: 12/13/20    Chief Complaint: OBESITY    History of Present Illness Sarah Fields is a 48 year old female with obesity and prediabetes who presents for obesity treatment and progress assessment.  She follows the prescribed category three eating plan approximately sixty percent of the time and engages in physical activity, including swimming and walking, two to three times per week. Despite these efforts, she has maintained her weight over the last three months.  She manages her prediabetes primarily through diet, exercise, and weight loss, and is currently taking metformin . She has a history of emotional eating behaviors, which have improved with Wellbutrin  treatment.  She received a knee injection approximately three weeks ago, which has significantly improved her ability to sleep, as she previously experienced sleep disturbances due to knee pain.  Her hunger levels are such that she does not feel the need to eat until around 11 AM, and she typically eats dinner late, around 8 or 9 PM. She does not consume a bedtime snack.  She has a history of vitamin D  deficiency and is on a prescription of vitamin D  50,000 IU per week.      PHYSICAL EXAM:  Blood pressure 124/86, pulse 94, temperature 98 F (36.7 C), height 5' 4 (1.626 m), weight 255 lb (115.7 kg), SpO2 98%. Body mass  index is 43.77 kg/m.  DIAGNOSTIC DATA REVIEWED:  BMET    Component Value Date/Time   NA 136 03/10/2024 1119   K 4.7 03/10/2024 1119   CL 101 03/10/2024 1119   CO2 24 03/10/2024 1119   GLUCOSE 79 03/10/2024 1119   GLUCOSE 88 09/07/2021 1105   BUN 12 03/10/2024 1119   CREATININE 0.98 03/10/2024 1119   CREATININE 0.90 08/30/2019 1143   CREATININE 0.79 05/30/2015 0922   CALCIUM 9.3 03/10/2024 1119   GFRNONAA >60 09/07/2021 1105   GFRNONAA >60 08/30/2019 1143   GFRAA 80 12/13/2020 0942   GFRAA >60 08/30/2019 1143   Lab Results  Component Value Date   HGBA1C 5.4 03/10/2024   HGBA1C 5.5 05/30/2015   Lab Results  Component Value Date   INSULIN  16.2 04/07/2023   INSULIN  21.4 12/13/2020   Lab Results  Component Value Date   TSH 4.260 03/10/2024   CBC    Component Value Date/Time   WBC 11.3 (H) 03/10/2024 1119   WBC 9.9 09/07/2021 1105   RBC 4.46 03/10/2024 1119   RBC 4.58 09/07/2021 1105   HGB 13.6 03/10/2024 1119   HGB 12.0 05/30/2015 0856   HCT 41.6 03/10/2024 1119   PLT 280 03/10/2024 1119   MCV 93 03/10/2024 1119   MCH 30.5 03/10/2024 1119   MCH 31.0 09/07/2021 1105   MCHC 32.7 03/10/2024 1119   MCHC 33.2 09/07/2021 1105   RDW 12.0 03/10/2024 1119   Iron Studies No results found for: IRON, TIBC, FERRITIN, IRONPCTSAT Lipid Panel  Component Value Date/Time   CHOL 166 03/10/2024 1119   TRIG 166 (H) 03/10/2024 1119   HDL 45 03/10/2024 1119   CHOLHDL 3.7 03/10/2024 1119   CHOLHDL 4.5 05/30/2015 0922   VLDL 59 (H) 05/30/2015 0922   LDLCALC 92 03/10/2024 1119   Hepatic Function Panel     Component Value Date/Time   PROT 6.6 03/10/2024 1119   ALBUMIN 4.2 03/10/2024 1119   AST 14 03/10/2024 1119   AST 14 (L) 08/30/2019 1143   ALT 8 03/10/2024 1119   ALT 12 08/30/2019 1143   ALKPHOS 116 03/10/2024 1119   BILITOT 0.4 03/10/2024 1119   BILITOT 0.3 08/30/2019 1143      Component Value Date/Time   TSH 4.260 03/10/2024 1119    Nutritional Lab Results  Component Value Date   VD25OH 66.1 09/01/2023   VD25OH 69.2 04/07/2023   VD25OH 48.0 10/23/2022     Assessment and Plan Assessment & Plan Obesity She adheres to the category three eating plan approximately 60% of the time and exercises, including swimming and walking, two to three times weekly. Her weight has been stable over the last three months. She is interested in exploring medication options for weight management, specifically Wegovy , due to a change in her insurance provider. Wegovy  is a weekly injection designed to reduce excessive hunger signals while maintaining normal hunger cues to avoid negative impacts on metabolism and muscle mass. - Continue category three eating plan and exercise regimen. - Prescribe Wegovy  and initiate prior authorization for insurance coverage. - Educate on Wegovy  administration technique and expected outcomes. - Monitor hunger signals to ensure appropriate dosing. - Behavior modifications for her obesity include dietary and exercise modifications documented above, meal planning and prepping strategies to help meet her nutritional and health goals as well as her financial goals.  Prediabetes Her prediabetes is managed through diet, exercise, and weight loss, supplemented by metformin . There is no indication of progression to diabetes. - Continue metformin . - Emphasize diet and exercise in managing prediabetes.  Emotional Eating Behaviors She reports improvement in emotional eating behaviors with Wellbutrin . There is no indication of worsening symptoms. - Continue Wellbutrin .  Vitamin D  Deficiency She is on a prescription of 50,000 IU of vitamin D  weekly with no current deficiency symptoms. - Continue vitamin D  50,000 IU weekly.      She was informed of the importance of frequent follow up visits to maximize her success with intensive lifestyle modifications for her multiple health conditions.    Jasmine Mesi,  MD

## 2024-05-29 ENCOUNTER — Other Ambulatory Visit (INDEPENDENT_AMBULATORY_CARE_PROVIDER_SITE_OTHER): Payer: Self-pay | Admitting: Family Medicine

## 2024-05-29 DIAGNOSIS — F3289 Other specified depressive episodes: Secondary | ICD-10-CM

## 2024-06-06 ENCOUNTER — Institutional Professional Consult (permissible substitution): Admitting: Neurology

## 2024-06-21 ENCOUNTER — Encounter (INDEPENDENT_AMBULATORY_CARE_PROVIDER_SITE_OTHER): Payer: Self-pay | Admitting: Family Medicine

## 2024-06-21 ENCOUNTER — Telehealth (INDEPENDENT_AMBULATORY_CARE_PROVIDER_SITE_OTHER): Payer: Self-pay

## 2024-06-21 ENCOUNTER — Ambulatory Visit (INDEPENDENT_AMBULATORY_CARE_PROVIDER_SITE_OTHER): Admitting: Family Medicine

## 2024-06-21 VITALS — BP 114/77 | HR 89 | Temp 98.0°F | Ht 64.0 in | Wt 256.0 lb

## 2024-06-21 DIAGNOSIS — E669 Obesity, unspecified: Secondary | ICD-10-CM

## 2024-06-21 DIAGNOSIS — R7303 Prediabetes: Secondary | ICD-10-CM

## 2024-06-21 DIAGNOSIS — F5089 Other specified eating disorder: Secondary | ICD-10-CM

## 2024-06-21 DIAGNOSIS — F3289 Other specified depressive episodes: Secondary | ICD-10-CM

## 2024-06-21 DIAGNOSIS — E781 Pure hyperglyceridemia: Secondary | ICD-10-CM | POA: Diagnosis not present

## 2024-06-21 DIAGNOSIS — E559 Vitamin D deficiency, unspecified: Secondary | ICD-10-CM | POA: Diagnosis not present

## 2024-06-21 DIAGNOSIS — Z6841 Body Mass Index (BMI) 40.0 and over, adult: Secondary | ICD-10-CM

## 2024-06-21 MED ORDER — VITAMIN D (ERGOCALCIFEROL) 1.25 MG (50000 UNIT) PO CAPS
50000.0000 [IU] | ORAL_CAPSULE | ORAL | 0 refills | Status: DC
Start: 2024-06-21 — End: 2024-08-17

## 2024-06-21 MED ORDER — BUPROPION HCL ER (SR) 200 MG PO TB12: 200.0000 mg | ORAL_TABLET | Freq: Every morning | ORAL | 0 refills | Status: DC

## 2024-06-21 MED ORDER — SEMAGLUTIDE-WEIGHT MANAGEMENT 0.5 MG/0.5ML ~~LOC~~ SOAJ: 0.5000 mg | SUBCUTANEOUS | 0 refills | Status: DC

## 2024-06-21 MED ORDER — SEMAGLUTIDE-WEIGHT MANAGEMENT 0.25 MG/0.5ML ~~LOC~~ SOAJ
0.2500 mg | SUBCUTANEOUS | 0 refills | Status: DC
Start: 2024-06-21 — End: 2024-06-21

## 2024-06-21 MED ORDER — METFORMIN HCL ER 750 MG PO TB24: 750.0000 mg | ORAL_TABLET | Freq: Every morning | ORAL | 0 refills | Status: DC

## 2024-06-21 NOTE — Progress Notes (Signed)
 Office: [phone]  /  Fax: [phone]  WEIGHT SUMMARY AND BIOMETRICS  Anthropometric Measurements Height: 5' 4 (1.626 m) Weight: 256 lb (116.1 kg) BMI (Calculated): 43.92 Weight at Last Visit: 255 lb Weight Lost Since Last Visit: 0 Weight Gained Since Last Visit: 1 lb Starting Weight: 295 lb Total Weight Loss (lbs): 39 lb (17.7 kg) Peak Weight: 297 lb   Body Composition  Body Fat %: 50.1 % Fat Mass (lbs): 128.2 lbs Muscle Mass (lbs): 121.4 lbs Total Body Water (lbs): 92.2 lbs Visceral Fat Rating : 16   Other Clinical Data Fasting: yes Labs: no Today's Visit #: 54 Starting Date: 12/13/20    Chief Complaint: OBESITY    History of Present Illness Sarah Fields is a 48 year old female with obesity and prediabetes who presents for obesity treatment and progress assessment.  She is adhering to a category three eating plan approximately 75% of the time and engages in physical activity by using her stationary bike for 15 minutes, five times a week. Despite these efforts, she has experienced a weight gain of one pound over the last six weeks. She is attempting to obtain Wegovy  for weight management but is facing insurance coverage issues.  She is managing her prediabetes by modifying her diet and increasing exercise to improve glucose control. She is currently taking metformin  XR 750 mg daily and requests a refill.  She has a history of emotional eating behaviors and is taking Wellbutrin  SR 200 mg in the morning. While she still experiences moments of emotional eating, it has improved since starting the medication. She reports no issues with sleep related to this medication.  She has vitamin D  deficiency, which is stable on prescription vitamin D . Her most recent vitamin D  level was 27, but she is due for a recheck as her last lab was in October of the previous year. She requests a refill of her vitamin D  prescription.  She has a history of  hypertriglyceridemia and is working on diet, exercise, and weight loss to manage this condition. Her triglycerides were previously noted to be slightly elevated at 166, and she is focusing on decreasing simple carbohydrates.  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 She recently went on a beach trip with her boss, who is also a friend. No problems with her current medications and no issues with sleep.      PHYSICAL EXAM:  Blood pressure 114/77, pulse 89, temperature 98 F (36.7 C), height 5' 4 (1.626 m), weight 256 lb (116.1 kg), SpO2 97%. Body mass index is 43.94 kg/m.  DIAGNOSTIC DATA REVIEWED:  BMET    Component Value Date/Time   NA 136 03/10/2024 1119   K 4.7 03/10/2024 1119   CL 101 03/10/2024 1119   CO2 24 03/10/2024 1119   GLUCOSE 79 03/10/2024 1119   GLUCOSE 88 09/07/2021 1105   BUN 12 03/10/2024 1119   CREATININE 0.98 03/10/2024 1119   CREATININE 0.90 08/30/2019 1143   CREATININE 0.79 05/30/2015 0922   CALCIUM 9.3 03/10/2024 1119   GFRNONAA >60 09/07/2021 1105   GFRNONAA >60 08/30/2019 1143   GFRAA 80 12/13/2020 0942   GFRAA >60 08/30/2019 1143   Lab Results  Component Value Date   HGBA1C 5.4 03/10/2024   HGBA1C 5.5 05/30/2015   Lab Results  Component Value Date   INSULIN  16.2 04/07/2023   INSULIN  21.4 12/13/2020   Lab  Results  Component Value Date   TSH 4.260 03/10/2024   CBC    Component Value Date/Time   WBC 11.3 (H) 03/10/2024 1119   WBC 9.9 09/07/2021 1105   RBC 4.46 03/10/2024 1119   RBC 4.58 09/07/2021 1105   HGB 13.6 03/10/2024 1119   HGB 12.0 05/30/2015 0856   HCT 41.6 03/10/2024 1119   PLT 280 03/10/2024 1119   MCV 93 03/10/2024 1119   MCH 30.5 03/10/2024 1119   MCH 31.0 09/07/2021 1105   MCHC 32.7 03/10/2024 1119   MCHC 33.2 09/07/2021 1105   RDW 12.0 03/10/2024 1119   Iron Studies No results found for: IRON, TIBC,  FERRITIN, IRONPCTSAT Lipid Panel     Component Value Date/Time   CHOL 166 03/10/2024 1119   TRIG 166 (H) 03/10/2024 1119   HDL 45 03/10/2024 1119   CHOLHDL 3.7 03/10/2024 1119   CHOLHDL 4.5 05/30/2015 0922   VLDL 59 (H) 05/30/2015 0922   LDLCALC 92 03/10/2024 1119   Hepatic Function Panel     Component Value Date/Time   PROT 6.6 03/10/2024 1119   ALBUMIN 4.2 03/10/2024 1119   AST 14 03/10/2024 1119   AST 14 (L) 08/30/2019 1143   ALT 8 03/10/2024 1119   ALT 12 08/30/2019 1143   ALKPHOS 116 03/10/2024 1119   BILITOT 0.4 03/10/2024 1119   BILITOT 0.3 08/30/2019 1143      Component Value Date/Time   TSH 4.260 03/10/2024 1119   Nutritional Lab Results  Component Value Date   VD25OH 66.1 09/01/2023   VD25OH 69.2 04/07/2023   VD25OH 48.0 10/23/2022     Assessment and Plan Assessment & Plan Obesity Obesity management is ongoing with a category three eating plan followed approximately 75% of the time and exercise on a stationary bike for 15 minutes, five times a week. Despite these efforts, she has gained one pound over the last six weeks, possibly due to fluid retention from recent travel and dietary sodium intake. She is attempting to obtain Wegovy  for weight management, but there are issues with insurance coverage and preauthorization. - Submit preauthorization for Wegovy  0.5 mg - Continue category three eating plan - Continue exercise regimen  Prediabetes Prediabetes is managed with lifestyle modifications and metformin  XR 750 mg daily. She is working on modifying her diet and increasing exercise to improve glucose control. Her last A1c was 5.4, indicating good control. - Refill metformin  XR 750 mg - Order labs to check blood sugar and insulin  levels - Continue category three eating plan - Continue exercise regimen  Hypertriglyceridemia Hypertriglyceridemia is managed with diet, exercise, and weight loss. Triglycerides were previously slightly elevated at 166,  indicative of carbohydrate intake. - Order labs to check triglyceride levels - Continue dietary modifications to decrease simple carbohydrates - Continue weight loss efforts - Continue category three eating plan - Continue exercise regimen  Emotional eating behaviors Emotional eating behaviors are present, and she is on Wellbutrin  SR 200 mg in the morning. She reports improvement in emotional eating since starting the medication. No issues with sleep are reported. - Refill Wellbutrin  SR 200 mg  Vitamin D  deficiency Vitamin D  deficiency is managed with prescription vitamin D . The last vitamin D  level was 66 in October of last year. Rechecking the vitamin D  level is necessary to ensure it remains below 100 to prevent potential tissue deposition and associated complications. - Refill prescription vitamin D  - Order labs to check vitamin D  levels     She was informed of the  importance of frequent follow up visits to maximize her success with intensive lifestyle modifications for her multiple health conditions.    Louann Penton, MD

## 2024-06-21 NOTE — Telephone Encounter (Signed)
 Prior auth started for Wegovy .  Here is the message from the plan.    Message from Plan Your patient will pay 100% of a discounted price for this medication. Any amount the patient pays will not apply to their deductible or out-of-pocket expenses

## 2024-06-23 LAB — CMP14+EGFR
ALT: 5 IU/L (ref 0–32)
AST: 11 IU/L (ref 0–40)
Albumin: 3.9 g/dL (ref 3.9–4.9)
Alkaline Phosphatase: 103 IU/L (ref 44–121)
BUN/Creatinine Ratio: 13 (ref 9–23)
BUN: 14 mg/dL (ref 6–24)
Bilirubin Total: 0.3 mg/dL (ref 0.0–1.2)
CO2: 20 mmol/L (ref 20–29)
Calcium: 9.1 mg/dL (ref 8.7–10.2)
Chloride: 102 mmol/L (ref 96–106)
Creatinine, Ser: 1.05 mg/dL — ABNORMAL HIGH (ref 0.57–1.00)
Globulin, Total: 2.2 g/dL (ref 1.5–4.5)
Glucose: 80 mg/dL (ref 70–99)
Potassium: 4.7 mmol/L (ref 3.5–5.2)
Sodium: 139 mmol/L (ref 134–144)
Total Protein: 6.1 g/dL (ref 6.0–8.5)
eGFR: 65 mL/min/1.73 (ref >59)

## 2024-06-23 LAB — HEMOGLOBIN A1C
Est. average glucose Bld gHb Est-mCnc: 108 mg/dL
Hgb A1c MFr Bld: 5.4 % (ref 4.8–5.6)

## 2024-06-23 LAB — LIPID PANEL WITH LDL/HDL RATIO
Cholesterol, Total: 149 mg/dL (ref 100–199)
HDL: 43 mg/dL (ref >39)
LDL Chol Calc (NIH): 83 mg/dL (ref 0–99)
LDL/HDL Ratio: 1.9 ratio (ref 0.0–3.2)
Triglycerides: 128 mg/dL (ref 0–149)
VLDL Cholesterol Cal: 23 mg/dL (ref 5–40)

## 2024-06-23 LAB — INSULIN, RANDOM: INSULIN: 14.2 u[IU]/mL (ref 2.6–24.9)

## 2024-06-23 LAB — VITAMIN B12: Vitamin B-12: 291 pg/mL (ref 232–1245)

## 2024-06-23 LAB — VITAMIN D 25 HYDROXY (VIT D DEFICIENCY, FRACTURES): Vit D, 25-Hydroxy: 59.2 ng/mL (ref 30.0–100.0)

## 2024-07-12 ENCOUNTER — Ambulatory Visit: Admitting: Neurology

## 2024-07-12 ENCOUNTER — Encounter: Payer: Self-pay | Admitting: Neurology

## 2024-07-12 VITALS — BP 144/97 | HR 92 | Ht 64.0 in | Wt 259.4 lb

## 2024-07-12 DIAGNOSIS — G43109 Migraine with aura, not intractable, without status migrainosus: Secondary | ICD-10-CM | POA: Diagnosis not present

## 2024-07-12 MED ORDER — NURTEC 75 MG PO TBDP: 75.0000 mg | ORAL_TABLET | Freq: Every day | ORAL | 11 refills | Status: DC | PRN

## 2024-07-12 MED ORDER — TOPIRAMATE 50 MG PO TABS: ORAL_TABLET | ORAL | 11 refills | Status: DC

## 2024-07-12 MED ORDER — ONDANSETRON 8 MG PO TBDP
8.0000 mg | ORAL_TABLET | Freq: Three times a day (TID) | ORAL | 11 refills | Status: AC | PRN
Start: 2024-07-12 — End: ?

## 2024-07-12 NOTE — Patient Instructions (Addendum)
 Options: Just Acute: Taking medications at onset of migraine and daily during period such as nurtec as needed and maxalt  as needed Prevention:  Nurtec every other day and then maxalt  as needed OR monthly Emgality, Ajovy, Qulipta.   PLAN: Start Topiramate  at bedtime 50mg  and, if no side effects, increase to 100mg  at bedtime for prevention Acute: Nurtec and maxalt . Take nurtec daily for the days of menstrual perionds and can start a few days before if you know  Ondansetron  for nausea (can take with nurtec and maxalt )  Meds ordered this encounter  Medications   Rimegepant Sulfate (NURTEC) 75 MG TBDP    Sig: Take 1 tablet (75 mg total) by mouth daily as needed. For migraines. Take as close to onset of migraine as possible. One daily maximum.    Dispense:  16 tablet    Refill:  11    Please discontinue prior nurtec prescriptions this is for #16 every month   ondansetron  (ZOFRAN -ODT) 8 MG disintegrating tablet    Sig: Take 1 tablet (8 mg total) by mouth every 8 (eight) hours as needed.    Dispense:  20 tablet    Refill:  11   topiramate  (TOPAMAX ) 50 MG tablet    Sig: Start Topiramate  at bedtime 50mg  and, if no side effects, increase to 100mg  at bedtime for prevention    Dispense:  60 tablet    Refill:  11    Topiramate  Tablets What is this medication? TOPIRAMATE  (toe PYRE a mate) prevents and controls seizures in people with epilepsy. It may also be used to prevent migraine headaches. It works by calming overactive nerves in your body. This medicine may be used for other purposes; ask your health care provider or pharmacist if you have questions. COMMON BRAND NAME(S): Topamax , Topiragen What should I tell my care team before I take this medication? They need to know if you have any of these conditions: Bleeding disorder Kidney disease Lung disease Suicidal thoughts, plans, or attempt by you or a family member An unusual or allergic reaction to topiramate , other medications, foods,  dyes, or preservatives Pregnant or trying to get pregnant Breast-feeding How should I use this medication? Take this medication by mouth with water. Take it as directed on the prescription label at the same time every day. Do not cut, crush or chew this medicine. Swallow the tablets whole. You can take it with or without food. If it upsets your stomach, take it with food. Keep taking it unless your care team tells you to stop. A special MedGuide will be given to you by the pharmacist with each prescription and refill. Be sure to read this information carefully each time. Talk to your care team about the use of this medication in children. While it may be prescribed for children as young as 2 years for selected conditions, precautions do apply. Overdosage: If you think you have taken too much of this medicine contact a poison control center or emergency room at once. NOTE: This medicine is only for you. Do not share this medicine with others. What if I miss a dose? If you miss a dose, take it as soon as you can unless it is within 6 hours of the next dose. If it is within 6 hours of the next dose, skip the missed dose. Take the next dose at the normal time. Do not take double or extra doses. What may interact with this medication? Acetazolamide Alcohol Antihistamines for allergy, cough, and cold Aspirin and aspirin-like  medications Atropine Certain medications for anxiety or sleep Certain medications for bladder problems, such as oxybutynin, tolterodine Certain medications for depression, such as amitriptyline, fluoxetine, sertraline Certain medications for Parkinson disease, such as benztropine, trihexyphenidyl Certain medications for seizures, such as carbamazepine, lamotrigine, phenobarbital, phenytoin, primidone , valproic acid, zonisamide Certain medications for stomach problems, such as dicyclomine, hyoscyamine Certain medications for travel sickness, such as scopolamine Certain  medications that treat or prevent blood clots, such as warfarin, enoxaparin, dalteparin, apixaban, dabigatran, rivaroxaban Digoxin Diltiazem Estrogen and progestin hormones General anesthetics, such as halothane, isoflurane, methoxyflurane, propofol Glyburide Hydrochlorothiazide Ipratropium Lithium Medications that relax muscles Metformin  NSAIDs, medications for pain and inflammation, such as ibuprofen  or naproxen Opioid medications for pain Phenothiazines, such as chlorpromazine, mesoridazine, prochlorperazine, thioridazine Pioglitazone This list may not describe all possible interactions. Give your health care provider a list of all the medicines, herbs, non-prescription drugs, or dietary supplements you use. Also tell them if you smoke, drink alcohol, or use illegal drugs. Some items may interact with your medicine. What should I watch for while using this medication? Visit your care team for regular checks on your progress. Tell your care team if your symptoms do not start to get better or if they get worse. Do not suddenly stop taking this medication. You may develop a severe reaction. Your care team will tell you how much medication to take. If your care team wants you to stop the medication, the dose may be slowly lowered over time to avoid any side effects. Wear a medical ID bracelet or chain. Carry a card that describes your condition. List the medications and doses you take on the card. This medication may affect your coordination, reaction time, or judgment. Do not drive or operate machinery until you know how this medication affects you. Sit up or stand slowly to reduce the risk of dizzy or fainting spells. Drinking alcohol with this medication can increase the risk of these side effects. This medication may cause serious skin reactions. They can happen weeks to months after starting the medication. Contact your care team right away if you notice fevers or flu-like symptoms with a  rash. The rash may be red or purple and then turn into blisters or peeling of the skin. You may also notice a red rash with swelling of the face, lips, or lymph nodes in your neck or under your arms. This medication may cause thoughts of suicide or depression. This includes sudden changes in mood, behaviors, or thoughts. These changes can happen at any time but are more common in the beginning of treatment or after a change in dose. Call your care team right away if you experience these thoughts or worsening depression. This medication may slow your child's growth if it is taken for a long time at high doses. Your child's care team will monitor your child's growth. Using this medication for a long time may weaken your bones. The risk of bone fractures may be increased. Talk to your care team about your bone health. Discuss this medication with your care team if you may be pregnant. Serious birth defects can occur if you take this medication during pregnancy. There are benefits and risks to taking medications during pregnancy. Your care team can help you find the option that works for you. Contraception is recommended while taking this medication. Estrogen and progestin hormones may not work as well while you are taking this medication. Your care team can help you find the option that works for you. Talk  to your care team before breastfeeding. Changes to your treatment plan may be needed. What side effects may I notice from receiving this medication? Side effects that you should report to your care team as soon as possible: Allergic reactions--skin rash, itching, hives, swelling of the face, lips, tongue, or throat High acid level--trouble breathing, unusual weakness or fatigue, confusion, headache, fast or irregular heartbeat, nausea, vomiting High ammonia level--unusual weakness or fatigue, confusion, loss of appetite, nausea, vomiting, seizures Fever that does not go away, decrease in sweat Kidney  stones--blood in the urine, pain or trouble passing urine, pain in the lower back or sides Redness, blistering, peeling or loosening of the skin, including inside the mouth Sudden eye pain or change in vision such as blurry vision, seeing halos around lights, vision loss Thoughts of suicide or self-harm, worsening mood, feelings of depression Side effects that usually do not require medical attention (report to your care team if they continue or are bothersome): Burning or tingling sensation in hands or feet Difficulty with paying attention, memory, or speech Dizziness Drowsiness Fatigue Loss of appetite with weight loss Slow or sluggish movements of the body This list may not describe all possible side effects. Call your doctor for medical advice about side effects. You may report side effects to FDA at 1-800-FDA-1088. Where should I keep my medication? Keep out of the reach of children and pets. Store between 15 and 30 degrees C (59 and 86 degrees F). Protect from moisture. Keep the container tightly closed. Get rid of any unused medication after the expiration date. To get rid of medications that are no longer needed or have expired: Take the medication to a medication take-back program. Check with your pharmacy or law enforcement to find a location. If you cannot return the medication, check the label or package insert to see if the medication should be thrown out in the garbage or flushed down the toilet. If you are not sure, ask your care team. If it is safe to put it in the trash, empty the medication out of the container. Mix the medication with cat litter, dirt, coffee grounds, or other unwanted substance. Seal the mixture in a bag or container. Put it in the trash. NOTE: This sheet is a summary. It may not cover all possible information. If you have questions about this medicine, talk to your doctor, pharmacist, or health care provider.  2024 [employer]vier/Gold Standard (2022-04-03  00:00:00)

## 2024-07-12 NOTE — Progress Notes (Signed)
 [id number]    Provider:  Dr Ines Requesting Provider: Cathlyn JAYSON [name] Bobie* Primary Care Provider:  Wallace Joesph LABOR, PA  CC:  Menstrual migraine  07/12/2024. 48 year old here for menstrual migraines. has Hypertriglyceridemia; Asthma; Benign essential tremor; Insulin  resistance; Vitamin D  deficiency; At risk for malnutrition; Intractable migraine with aura without status migrainosus; Low HDL (under 40); BMI 40.0-44.9, adult (HCC); Pre-diabetes; Depression; At risk for impaired metabolic function; B12 deficiency; Mixed hyperlipidemia; Stress; Class 3 severe obesity with serious comorbidity and body mass index (BMI) of 50.0 to 59.9 in adult; Episodic migraine; Family history of colonic polyps; Nonintractable headache; TSH elevation; Obesity, Beginning BMI 50.64; Other constipation; and Migraine with aura and without status migrainosus, not intractable on their problem list.  Was seen over 3 years ago for tremors.   I reviewed Dr. Cyrilla notes from January 26, 2024, patient has heavy menses, in June 2020 for her migraines became more regular and more severe, the prior 2 months horrible migraines during her menses, Nurtec and Maxalt  were not helpful, her primary care treating her headaches.  I also reviewed Heather  Boscia's notes from February 24, 2023, she had been treating her for migraines, more severe in menstrual cycles, trial of Nurtec, which was filled, at that time she stated that Nurtec was really working and had no negative side effects, she also continue Maxalt  as needed but not needed since starting on Nurtec, [facility], essential tremor on primidone .  She is here alone. Migraines ongoing for decades, maxalt  used to work great but sedating, she also tried nurtec but neither really help with the menstrual migraines. These help with other migraines but during her menstrual period nothing seems to help. Migraines are lessening, not as  bad. Allergies and heat will trigger. Pulsating/pounding/throbbing, light and sound sensitivity, nausea, vomiting, hurts to move, migraines are moderate to severe and can last 8-24 hours or more, unilateral but can spread to be holocephalic. They are significantly affecting quality of life with work, family. She has >7 total headache days a month and > 7-10 total total headache days a month for the last > 3 months, no medication overuse, no aura. She is not regular anymore. Doesn't want to consider IUD or patch.No other focal neurologic deficits, associated symptoms, inciting events or modifiable factors. Had aura in the past. No changes, they are worsening in severity during periods but likely due to peri-menopause. No vision changes, not positional or exertional.   From a thorough review of records and patient report, Medications tried that can be used in migraine/headache management greater than 3 months include: Lifestyle modification, headache diaries, better sleep hygiene, exercise, management of migraine triggers, OTC and prescribed analgesics/nsaids such as ibuprofen , excedrin, alleve and others, Aimovig contraindicated due to constipation, wellbutrin , prozac, propranolol , nurtec, imitrex , maxalt , sumatriptan , zolmitriptan, amitriptyline  Discussed:  There is increased risk for stroke in women with migraine with aura and a contraindication for the combined contraceptive pill for use by women who have migraine with aura. The risk for women with migraine without aura is lower. However other risk factors like smoking are far more likely to increase stroke risk than migraine. There is a recommendation for no smoking and for the use of OCPs without estrogen such as progestogen only pills particularly for women with migraine with aura.SABRA People who have migraine headaches with auras may be 3 times more likely to have a stroke caused by a blood clot, compared to migraine  patients who don't see auras. Women who  take hormone-replacement therapy may be 30 percent more likely to suffer a clot-based stroke than women not taking medication containing estrogen. Other risk factors like smoking and high blood pressure may be  much more important. And stroke is still a rare complication due to migraine aura and is controversial and lower doses may not cause a risk.   Reviewed notes, labs and imaging from outside physicians, which showed:  Hbga1c nml 5.4, cmp essentially normal slight increase creat 1.05, b12 low normal 291, tsh nml  09/07/2021: CT orbits: reviewed report CLINICAL DATA:  Orbital cellulitis   EXAM: CT ORBITS WITH CONTRAST   TECHNIQUE: Multidetector CT images was performed according to the standard protocol following intravenous contrast administration.   CONTRAST:  OMNIPAQUE  IOHEXOL  300 MG/ML  SOLN   COMPARISON:  No pertinent prior exam.   FINDINGS: Orbits: Left periorbital/preseptal stranding/edema in the region the left temple and left preseptal orbit. No postseptal stranding/edema. No discrete drainable fluid collection. The extraocular muscles, globes and optic nerve sheaths/optic nerves are unremarkable. Query slight asymmetric enlargement of the left lacrimal gland.   Visible paranasal sinuses: Clear.   Soft tissues: See above for description of left periorbital edema.   Osseous: No fracture or aggressive lesion.   Limited intracranial: No acute or significant finding.   IMPRESSION: Mild edema in the region of the left temple and left preseptal orbit, compatible with reported cellulitis. No evidence of postseptal orbital involvement or drainable fluid collection. Query slight asymmetric enlargement of the left lacrimal gland, which could represent involvement/dacroadenitis.     Review of Systems: Patient complains of symptoms per HPI as well as the following symptoms: tremor . Pertinent negatives and positives per HPI. All others negative.   HPI 03/13/2020:   Denecia Brunette is a 48 y.o. female here as requested by Cathlyn JAYSON [name] Bobie* for tremors.  I reviewed Dr. Abram notes, patient has a past medical history of migraine, anxiety, insomnia, essential tremor, asthma, morbid obesity, unspecified leukocytosis, gestational diabetes, vitamin D  deficiency.  Appears that she was tried on propranolol  but worsened her asthma, propranolol  was for tremors.  She was recently admitted in February for respiratory symptoms admitted for asthma exacerbation.  She was advised to stop the propranolol  upon discharge from the hospital, with this change her tremors have become significantly worse, she also has a history of anxiety but is not on any treatment for it, she is not able to differentiate if the shaking is from her tremors or her mood with feeling nervous.  Of note, when she was seen in January 2021 she had not had a migraine in over 4 months but was having some headaches, experiencing light sensitivity and nausea with the migraines occasionally vomiting, Maxalt  works acutely, he can trigger the migraines, her allergies are triggered by dust pollen mold and grass.  She apparently did see a neurologist in the past to evaluate her essential tremor and she was put on propranolol .  Tremor started in 2018. Still mostly the right arm occasionally to the left, action when she is writing and postural. She catches herself making fists because she can't see the tremor as much. She can feel the shaking inside. Propranolol  helped but had asthma attack, she tried it again and had asthma symptoms. She is here alone. She has essential tremor, was treated with propranolol  but can no longer tolerate due to asthma.Since she has been off of the medication it is worsening, problems writing  and typing. Dad had tremors too, he also has epilepsy. No weakness, no numbness or paresthesias. She has not noticed it in her voice or head but son noticed it in the past . Progressive and  worsening. No significant caffeine. TSH checked in the past. She has migraines, they have been better lately, maxalt  helps but she has to go to sleep, triggers are allergies and dust and she is seeing an asthma doctor/allergist for that now started allergy shots 2 weeks ago and feeling better. She snores, she did a sleep study and was negative, she thinks it is the allergies as well because since switching allergy meds the snoring is reduced. No other focal neurologic deficits, associated symptoms, inciting events or modifiable factors.    Reviewed notes, labs and imaging from outside physicians, which showed:  THS normal 01/19/2020, bmp bun/cr 14/0.95  Review of Systems: Patient complains of symptoms per HPI as well as the following symptoms: tremor. Pertinent negatives and positives per HPI. All others negative.   Social History   Socioeconomic History   Marital status: Divorced    Spouse name: Not on file   Number of children: 2   Years of education: Not on file   Highest education level: Master's degree (e.g., MA, MS, MEng, MEd, MSW, MBA)  Occupational History   Occupation: infant toddler Scientific laboratory technician [employer]  Tobacco Use   Smoking status: Never    Passive exposure: Past   Smokeless tobacco: Never  Vaping Use   Vaping status: Never Used  Substance and Sexual Activity   Alcohol use: Never    Alcohol/week: 0.0 standard drinks of alcohol   Drug use: Never   Sexual activity: Not Currently    Partners: Male    Birth control/protection: Surgical    Comment: Tubal  Other Topics Concern   Not on file  Social History Narrative   Lives at home with her children   Right handed   Caffeine: 1 glass of tea per day.   Social Drivers of Corporate investment banker Strain: Not on file  Food Insecurity: Not on file  Transportation Needs: Not on file  Physical Activity: Not on file  Stress: Not on file  Social Connections: Not on file  Intimate Partner Violence: Not  on file    Family History  Problem Relation Age of Onset   Hyperlipidemia Mother    Migraines Mother    Thyroid  disease Mother    Glaucoma Mother    Osteoporosis Mother    Anxiety disorder Mother    Seizures Father    Hypertension Father    Cancer Father 5       Prostate Ca   Glaucoma Father    Stroke Maternal Grandmother    Cancer Maternal Grandfather    Migraines Maternal Grandfather    Stroke Paternal Grandfather    Tremor Neg Hx     Past Medical History:  Diagnosis Date   Allergy    Anxiety    Asthma    Broken foot    Depression    Essential tremor    Gestational diabetes    h/o   History of COVID-19 04/24/2021   Joint pain    Migraines    with aura   Other fatigue    Shortness of breath    Shortness of breath on exertion    Tenosynovitis, de Quervain    Vitamin D  deficiency     Patient Active Problem List   Diagnosis Date Noted  Migraine with aura and without status migrainosus, not intractable 07/12/2024   Other constipation 08/26/2023   TSH elevation 12/25/2022   Obesity, Beginning BMI 50.64 12/25/2022   Nonintractable headache 09/11/2022   Episodic migraine 08/25/2022   Family history of colonic polyps 08/25/2022   Stress 07/31/2022   Class 3 severe obesity with serious comorbidity and body mass index (BMI) of 50.0 to 59.9 in adult 07/31/2022   B12 deficiency 06/19/2022   Mixed hyperlipidemia 06/19/2022   At risk for impaired metabolic function 05/06/2022   Depression 03/25/2022   Pre-diabetes 02/23/2022   Low HDL (under 40) 09/01/2021   BMI 40.0-44.9, adult (HCC) 09/01/2021   Intractable migraine with aura without status migrainosus 08/04/2021   Insulin  resistance 03/21/2021   Vitamin D  deficiency 03/21/2021   At risk for malnutrition 03/21/2021   Benign essential tremor 03/13/2020   Asthma 01/19/2020   Hypertriglyceridemia 05/31/2015    Past Surgical History:  Procedure Laterality Date   CESAREAN SECTION     2001, 2003   KNEE  SURGERY Bilateral    arthroscopic   TUBAL LIGATION     WISDOM TOOTH EXTRACTION      Current Outpatient Medications  Medication Sig Dispense Refill   albuterol  (PROVENTIL  HFA;VENTOLIN  HFA) 108 (90 BASE) MCG/ACT inhaler Inhale 2 puffs into the lungs every 6 (six) hours as needed for wheezing or shortness of breath.      Azelastine-Fluticasone  137-50 MCG/ACT SUSP Place 1 spray into both nostrils 2 (two) times daily.     buPROPion  (WELLBUTRIN  SR) 200 MG 12 hr tablet Take 1 tablet (200 mg total) by mouth in the morning. 90 tablet 0   EPINEPHrine 0.3 mg/0.3 mL IJ SOAJ injection INJECT IN OUTER THIGH AS NEEDED FOR ANAPHYLAXIS     fluticasone  (FLONASE ) 50 MCG/ACT nasal spray Place 2 sprays into both nostrils daily.     levocetirizine (XYZAL) 5 MG tablet SMARTSIG:1 Tablet(s) By Mouth Every Evening     Magnesium  400 MG CAPS Take 1 tablet by mouth daily.     metFORMIN  (GLUCOPHAGE -XR) 750 MG 24 hr tablet Take 1 tablet (750 mg total) by mouth in the morning. 90 tablet 0   montelukast  (SINGULAIR ) 10 MG tablet Take 10 mg by mouth at bedtime.      norethindrone  (INCASSIA ) 0.35 MG tablet Take 1 tablet (0.35 mg total) by mouth daily. 84 tablet 3   nystatin  (MYCOSTATIN /NYSTOP ) powder Apply 1 Application topically 3 (three) times daily. Apply to skin of the affected area for up to 7 days 30 g 2   ondansetron  (ZOFRAN -ODT) 8 MG disintegrating tablet Take 1 tablet (8 mg total) by mouth every 8 (eight) hours as needed. 20 tablet 11   polyethylene glycol (MIRALAX  / GLYCOLAX ) 17 g packet Take 17 g by mouth daily. With increased water intake. 90 packet 0   primidone  (MYSOLINE ) 50 MG tablet TAKE 1 TABLET BY MOUTH EVERYDAY AT BEDTIME 90 tablet 4   Rimegepant Sulfate (NURTEC) 75 MG TBDP Take 1 tablet (75 mg total) by mouth daily as needed. For migraines. Take as close to onset of migraine as possible. One daily maximum. 16 tablet 11   rizatriptan  (MAXALT ) 10 MG tablet Take 1 tablet (10 mg total) by mouth as needed for  migraine. TAKE 1 TABLET BY MOUTH AS NEEDED FOR MIGRAINE. MAY REPEAT IN 2 HOURS IF NEEDED 10 tablet 5   sodium chloride  (OCEAN) 0.65 % SOLN nasal spray Place 1 spray into both nostrils as needed for congestion.  0   SYMBICORT 160-4.5  MCG/ACT inhaler      topiramate  (TOPAMAX ) 50 MG tablet Start Topiramate  at bedtime 50mg  and, if no side effects, increase to 100mg  at bedtime for prevention 60 tablet 11   Vitamin D , Ergocalciferol , (DRISDOL ) 1.25 MG (50000 UNIT) CAPS capsule Take 1 capsule (50,000 Units total) by mouth every 7 (seven) days. 15 capsule 0   No current facility-administered medications for this visit.    Allergies as of 07/12/2024 - Review Complete 07/12/2024  Allergen Reaction Noted   Banana  12/13/2020   Hydrocodone  Other (See Comments) 10/12/2020   Latex  02/04/2014   Other  02/04/2014   Penicillins Swelling 02/04/2014   Pineapple  12/13/2020   Propranolol   03/13/2020   Strawberry extract  12/13/2020   Adhesive [tape] Rash 10/03/2015   Bactrim [sulfamethoxazole-trimethoprim] Rash 06/13/2016    Vitals: BP (!) 144/97 (Cuff Size: Large)   Pulse 92   Ht 5' 4 (1.626 m)   Wt 259 lb 6.4 oz (117.7 kg)   BMI 44.53 kg/m  Last Weight:  Wt Readings from Last 1 Encounters:  07/12/24 259 lb 6.4 oz (117.7 kg)   Last Height:   Ht Readings from Last 1 Encounters:  07/12/24 5' 4 (1.626 m)     Physical exam: Exam: Gen: NAD, conversant, well nourised, obese BMI > 44, well groomed                     CV: RRR, no MRG. No Carotid Bruits. No peripheral edema, warm, nontender Eyes: Conjunctivae clear without exudates or hemorrhage  Neuro: Detailed Neurologic Exam  Speech:    Speech is normal; fluent and spontaneous with normal comprehension.  Cognition:    The patient is oriented to person, place, and time;     recent and remote memory intact;     language fluent;     normal attention, concentration,     fund of knowledge Cranial Nerves:    The pupils are equal, round,  and reactive to light. No papilledema appreciated Visual fields are full to finger confrontation. Extraocular movements are intact. Trigeminal sensation is intact and the muscles of mastication are normal. The face is symmetric. The palate elevates in the midline. Hearing intact. Voice is normal. Shoulder shrug is normal. The tongue has normal motion without fasciculations.   Coordination: nml  Gait: nml  Motor Observation:    Postural and action tremor, high frequency low amplitude right > left Tone:    Normal muscle tone.    Posture:    Posture is normal. normal erect    Strength:    Strength is V/V in the upper and lower limbs.      Sensation: intact to LT     Reflex Exam:  DTR's:    Deep tendon reflexes in the upper and lower extremities are symmetrical bilaterally.   Toes:    The toes are downgoing bilaterally.   Clonus:    Clonus is absent.    Assessment/Plan:  Patient with episodic migraines PLAN: Start Topiramate  at bedtime 50mg  and, if no side effects, increase to 100mg  at bedtime for prevention Acute: Nurtec and maxalt . Take nurtec daily for the days of menstrual perionds and can start a few days before if you know  Ondansetron  for nausea (can take with nurtec and maxalt )  Other Options: Just Acute: Taking medications at onset of migraine and daily during period such as nurtec as needed and maxalt  as needed Prevention:  Nurtec every other day and then maxalt  as needed  OR monthly [place], [place], [place].   Discussed:  There is increased risk for stroke in women with migraine with aura and a contraindication for the combined contraceptive pill for use by women who have migraine with aura. The risk for women with migraine without aura is lower. However other risk factors like smoking are far more likely to increase stroke risk than migraine. There is a recommendation for no smoking and for the use of OCPs without estrogen such as progestogen only pills particularly  for women with migraine with aura.SABRA People who have migraine headaches with auras may be 3 times more likely to have a stroke caused by a blood clot, compared to migraine patients who don't see auras. Women who take hormone-replacement therapy may be 30 percent more likely to suffer a clot-based stroke than women not taking medication containing estrogen. Other risk factors like smoking and high blood pressure may be  much more important. And stroke is still a rare complication due to migraine aura and is controversial and lower doses may not cause a risk.   Meds ordered this encounter  Medications   Rimegepant Sulfate (NURTEC) 75 MG TBDP    Sig: Take 1 tablet (75 mg total) by mouth daily as needed. For migraines. Take as close to onset of migraine as possible. One daily maximum.    Dispense:  16 tablet    Refill:  11    Please discontinue prior nurtec prescriptions this is for #16 every month   ondansetron  (ZOFRAN -ODT) 8 MG disintegrating tablet    Sig: Take 1 tablet (8 mg total) by mouth every 8 (eight) hours as needed.    Dispense:  20 tablet    Refill:  11   topiramate  (TOPAMAX ) 50 MG tablet    Sig: Start Topiramate  at bedtime 50mg  and, if no side effects, increase to 100mg  at bedtime for prevention    Dispense:  60 tablet    Refill:  11    Cc: Cathlyn JAYSON Cary, Brook*,    Onetha Epp, MD  [facility] [address] [place], [place] [address]  Phone [phone] Fax [phone]  I spent 46 minutes of face-to-face and non-face-to-face time with patient on the  1. Migraine with aura and without status migrainosus, not intractable    diagnosis.  This included previsit chart review, lab review, study review, order entry, electronic health record documentation, patient education on the different diagnostic and therapeutic options, counseling and coordination of care, risks and benefits of management, compliance, or risk factor reduction

## 2024-07-20 ENCOUNTER — Encounter (INDEPENDENT_AMBULATORY_CARE_PROVIDER_SITE_OTHER): Payer: Self-pay | Admitting: Family Medicine

## 2024-07-20 ENCOUNTER — Ambulatory Visit (INDEPENDENT_AMBULATORY_CARE_PROVIDER_SITE_OTHER): Admitting: Family Medicine

## 2024-07-20 VITALS — BP 123/83 | HR 81 | Temp 98.2°F | Ht 64.0 in | Wt 256.0 lb

## 2024-07-20 DIAGNOSIS — E669 Obesity, unspecified: Secondary | ICD-10-CM | POA: Diagnosis not present

## 2024-07-20 DIAGNOSIS — Z6841 Body Mass Index (BMI) 40.0 and over, adult: Secondary | ICD-10-CM

## 2024-07-20 DIAGNOSIS — G43909 Migraine, unspecified, not intractable, without status migrainosus: Secondary | ICD-10-CM

## 2024-07-20 DIAGNOSIS — G43119 Migraine with aura, intractable, without status migrainosus: Secondary | ICD-10-CM

## 2024-07-20 DIAGNOSIS — K5900 Constipation, unspecified: Secondary | ICD-10-CM | POA: Diagnosis not present

## 2024-07-20 DIAGNOSIS — K5909 Other constipation: Secondary | ICD-10-CM

## 2024-07-20 DIAGNOSIS — E66813 Obesity, class 3: Secondary | ICD-10-CM

## 2024-07-20 MED ORDER — POLYETHYLENE GLYCOL 3350 17 G PO PACK: 17.0000 g | PACK | Freq: Every day | ORAL | 0 refills | Status: AC

## 2024-07-20 MED ORDER — TOPIRAMATE 50 MG PO TABS: 50.0000 mg | ORAL_TABLET | Freq: Two times a day (BID) | ORAL | 0 refills | Status: DC

## 2024-07-20 NOTE — Progress Notes (Signed)
 Office: [phone]  /  Fax: [phone]  WEIGHT SUMMARY AND BIOMETRICS  Anthropometric Measurements Height: 5' 4 (1.626 m) Weight: 256 lb (116.1 kg) BMI (Calculated): 43.92 Weight at Last Visit: 256 lb Weight Lost Since Last Visit: 0 Weight Gained Since Last Visit: 0 Starting Weight: 295 lb Total Weight Loss (lbs): 39 lb (17.7 kg) Peak Weight: 297 lb   Body Composition  Body Fat %: 48.8 % Fat Mass (lbs): 125 lbs Muscle Mass (lbs): 124.4 lbs Total Body Water (lbs): 91 lbs Visceral Fat Rating : 16   Other Clinical Data Fasting: yes Labs: no Today's Visit #: 55 Starting Date: 12/13/20    Chief Complaint: OBESITY   Discussed the use of AI scribe software for clinical note transcription with the patient, who gave verbal consent to proceed.  History of Present Illness Sarah Fields is a 48 year old female with obesity who presents for obesity treatment and progress assessment.  She adheres to a category three eating plan approximately 75% of the time. Her exercise routine includes using a stationary bike for 10 to 50 minutes, two to three times per week. Her weight has remained stable over the last month since her previous visit.  She experiences constipation and manages it with Miralax  17 grams daily, along with increased water intake. She requests a refill for Miralax .  She has been prescribed topiramate , starting at 50 mg at bedtime, with the option to increase to 100 mg if tolerated. She has not yet started the medication due to financial constraints but plans to purchase it soon.      PHYSICAL EXAM:  Blood pressure 123/83, pulse 81, temperature 98.2 F (36.8 C), height 5' 4 (1.626 m), weight 256 lb (116.1 kg), SpO2 99%. Body mass index is 43.94 kg/m.  DIAGNOSTIC DATA REVIEWED:  BMET    Component Value Date/Time   NA 139 06/21/2024 0858   K 4.7 06/21/2024 0858   CL 102 06/21/2024 0858   CO2 20 06/21/2024 0858   GLUCOSE 80 06/21/2024  0858   GLUCOSE 88 09/07/2021 1105   BUN 14 06/21/2024 0858   CREATININE 1.05 (H) 06/21/2024 0858   CREATININE 0.90 08/30/2019 1143   CREATININE 0.79 05/30/2015 0922   CALCIUM 9.1 06/21/2024 0858   GFRNONAA >60 09/07/2021 1105   GFRNONAA >60 08/30/2019 1143   GFRAA 80 12/13/2020 0942   GFRAA >60 08/30/2019 1143   Lab Results  Component Value Date   HGBA1C 5.4 06/21/2024   HGBA1C 5.5 05/30/2015   Lab Results  Component Value Date   INSULIN  14.2 06/21/2024   INSULIN  21.4 12/13/2020   Lab Results  Component Value Date   TSH 4.260 03/10/2024   CBC    Component Value Date/Time   WBC 11.3 (H) 03/10/2024 1119   WBC 9.9 09/07/2021 1105   RBC 4.46 03/10/2024 1119   RBC 4.58 09/07/2021 1105   HGB 13.6 03/10/2024 1119   HGB 12.0 05/30/2015 0856   HCT 41.6 03/10/2024 1119   PLT 280 03/10/2024 1119   MCV 93 03/10/2024 1119   MCH 30.5 03/10/2024 1119   MCH 31.0 09/07/2021 1105   MCHC 32.7 03/10/2024 1119   MCHC 33.2 09/07/2021 1105   RDW 12.0 03/10/2024 1119   Iron Studies No results found for: IRON, TIBC, FERRITIN, IRONPCTSAT Lipid Panel     Component Value Date/Time   CHOL 149 06/21/2024 0858   TRIG 128 06/21/2024 0858   HDL 43 06/21/2024 0858   CHOLHDL 3.7 03/10/2024 1119  CHOLHDL 4.5 05/30/2015 0922   VLDL 59 (H) 05/30/2015 0922   LDLCALC 83 06/21/2024 0858   Hepatic Function Panel     Component Value Date/Time   PROT 6.1 06/21/2024 0858   ALBUMIN 3.9 06/21/2024 0858   AST 11 06/21/2024 0858   AST 14 (L) 08/30/2019 1143   ALT 5 06/21/2024 0858   ALT 12 08/30/2019 1143   ALKPHOS 103 06/21/2024 0858   BILITOT 0.3 06/21/2024 0858   BILITOT 0.3 08/30/2019 1143      Component Value Date/Time   TSH 4.260 03/10/2024 1119   Nutritional Lab Results  Component Value Date   VD25OH 59.2 06/21/2024   VD25OH 66.1 09/01/2023   VD25OH 69.2 04/07/2023     Assessment and Plan Assessment & Plan Obesity and Migraine Obesity management with a category  three eating plan, adherence at 75%. Exercise includes stationary biking 10-50 minutes, 2-3 times per week. Weight maintenance over the last month. Introduction of topiramate  for weight loss, not yet started due to financial constraints. Discussed potential side effects of topiramate , including taste disturbances and cognitive effects at higher doses. Topiramate  targets emotional, stress, and binge eating. Insurance coverage and cost considerations discussed, with GoodRx as an alternative. Potential for birth defects with topiramate  noted, though she is on birth control for hormonal regulation and not sexually active. - Continue category three eating plan. - Continue exercise regimen with stationary biking. - Start topiramate  50 mg at bedtime, increase to 100 mg if tolerated. - Send topiramate  prescription to [facility] for cost comparison. - Reassess weight management plan in one month.  Constipation Chronic constipation managed with Miralax  17 grams daily and increased water intake. - Refill Miralax  prescription through [facility]. - Continue hydration and exercise to help with constipation    She was informed of the importance of frequent follow up visits to maximize her success with intensive lifestyle modifications for her multiple health conditions.    Louann Penton, MD

## 2024-07-29 ENCOUNTER — Encounter: Payer: Self-pay | Admitting: Family Medicine

## 2024-07-29 ENCOUNTER — Encounter: Payer: Self-pay | Admitting: Neurology

## 2024-07-29 MED ORDER — PRIMIDONE 50 MG PO TABS: ORAL_TABLET | ORAL | 4 refills | Status: AC

## 2024-08-02 ENCOUNTER — Other Ambulatory Visit: Payer: Self-pay | Admitting: *Deleted

## 2024-08-02 MED ORDER — NURTEC 75 MG PO TBDP: 75.0000 mg | ORAL_TABLET | Freq: Every day | ORAL | 11 refills | Status: AC | PRN

## 2024-08-05 ENCOUNTER — Other Ambulatory Visit (INDEPENDENT_AMBULATORY_CARE_PROVIDER_SITE_OTHER): Payer: Self-pay | Admitting: Family Medicine

## 2024-08-05 DIAGNOSIS — F3289 Other specified depressive episodes: Secondary | ICD-10-CM

## 2024-08-05 DIAGNOSIS — R7303 Prediabetes: Secondary | ICD-10-CM

## 2024-08-17 ENCOUNTER — Encounter (INDEPENDENT_AMBULATORY_CARE_PROVIDER_SITE_OTHER): Payer: Self-pay | Admitting: Family Medicine

## 2024-08-17 ENCOUNTER — Telehealth (INDEPENDENT_AMBULATORY_CARE_PROVIDER_SITE_OTHER): Admitting: Family Medicine

## 2024-08-17 VITALS — Ht 64.0 in | Wt 256.0 lb

## 2024-08-17 DIAGNOSIS — F32A Depression, unspecified: Secondary | ICD-10-CM | POA: Diagnosis not present

## 2024-08-17 DIAGNOSIS — F3289 Other specified depressive episodes: Secondary | ICD-10-CM

## 2024-08-17 DIAGNOSIS — J069 Acute upper respiratory infection, unspecified: Secondary | ICD-10-CM | POA: Diagnosis not present

## 2024-08-17 DIAGNOSIS — E66813 Obesity, class 3: Secondary | ICD-10-CM

## 2024-08-17 DIAGNOSIS — Z6841 Body Mass Index (BMI) 40.0 and over, adult: Secondary | ICD-10-CM

## 2024-08-17 DIAGNOSIS — E559 Vitamin D deficiency, unspecified: Secondary | ICD-10-CM | POA: Diagnosis not present

## 2024-08-17 DIAGNOSIS — F5089 Other specified eating disorder: Secondary | ICD-10-CM

## 2024-08-17 MED ORDER — TOPIRAMATE 50 MG PO TABS: 50.0000 mg | ORAL_TABLET | Freq: Two times a day (BID) | ORAL | 0 refills | Status: DC

## 2024-08-17 MED ORDER — VITAMIN D (ERGOCALCIFEROL) 1.25 MG (50000 UNIT) PO CAPS: 50000.0000 [IU] | ORAL_CAPSULE | ORAL | 0 refills | Status: DC

## 2024-08-17 MED ORDER — BUPROPION HCL ER (SR) 200 MG PO TB12: 200.0000 mg | ORAL_TABLET | Freq: Every morning | ORAL | 0 refills | Status: DC

## 2024-08-17 MED ORDER — METFORMIN HCL 500 MG PO TABS: 500.0000 mg | ORAL_TABLET | Freq: Two times a day (BID) | ORAL | 0 refills | Status: DC

## 2024-08-17 NOTE — Progress Notes (Signed)
 Office: [phone]  /  Fax: [phone]  WEIGHT SUMMARY AND BIOMETRICS  Anthropometric Measurements Height: 5' 4 (1.626 m) Weight: 256 lb (116.1 kg) (last ov weight) BMI (Calculated): 43.92 Weight Lost Since Last Visit: 0 Weight Gained Since Last Visit: 0 Starting Weight: 295 lb Total Weight Loss (lbs): 39 lb (17.7 kg) Peak Weight: 297 lb   No data recorded Other Clinical Data Fasting: no Labs: no Today's Visit #: 56 Starting Date: 12/13/20 Comments: My Chart Video Visit    Chief Complaint: OBESITY  Virtual Visit via A/V Note  I connected with [name] on 08/17/24 at  8:20 AM EDT by audiovisual telehealth and verified that I am speaking with the correct person using two identifiers.  Location: Patient: home Provider: office   I discussed the limitations, risks, security and privacy concerns of performing an evaluation and management service by AV telehealth and the availability of in person appointments. I also discussed with the patient that there may be a patient responsible charge related to this service. The patient expressed understanding and agreed to proceed.     History of Present Illness   Sarah Fields is a 48 year old female who presents for a follow-up on weight management and recent symptoms of sore throat and congestion.  She has been experiencing a sore throat and congestion, with extreme fatigue being her most severe symptom. This fatigue has led her to sleep extensively, including one instance where she slept all day. Her work-from-home setup allows her the flexibility to rest as needed.  She is actively managing her weight by adhering to her eating plan 75% of the time, focusing on increasing her intake of fruits and vegetables, and being mindful of protein consumption. She occasionally skips meals but aims to get seven to nine hours of sleep per night. She engages in stationary biking for five to ten minutes, three  days a week, and feels she has maintained her weight since the last visit.  She is currently on prescription vitamin D  for vitamin D  deficiency, taking calcifediol at a dose of fifty thousand units. She is also on Wellbutrin  and topiramate  to address emotional eating behaviors, with topiramate  at fifty mg twice a day. She has not yet increased the dose due to initial headaches, which have since subsided.  No issues with her medications and has been on them for a while. No problems with her medications.          PHYSICAL EXAM:  Height 5' 4 (1.626 m), weight 256 lb (116.1 kg). Body mass index is 43.94 kg/m.  DIAGNOSTIC DATA REVIEWED:  BMET    Component Value Date/Time   NA 139 06/21/2024 0858   K 4.7 06/21/2024 0858   CL 102 06/21/2024 0858   CO2 20 06/21/2024 0858   GLUCOSE 80 06/21/2024 0858   GLUCOSE 88 09/07/2021 1105   BUN 14 06/21/2024 0858   CREATININE 1.05 (H) 06/21/2024 0858   CREATININE 0.90 08/30/2019 1143   CREATININE 0.79 05/30/2015 0922   CALCIUM 9.1 06/21/2024 0858   GFRNONAA >60 09/07/2021 1105   GFRNONAA >60 08/30/2019 1143   GFRAA 80 12/13/2020 0942   GFRAA >60 08/30/2019 1143   Lab Results  Component Value Date   HGBA1C 5.4 06/21/2024   HGBA1C 5.5 05/30/2015   Lab Results  Component Value Date   INSULIN  14.2 06/21/2024   INSULIN  21.4 12/13/2020   Lab Results  Component Value Date   TSH 4.260 03/10/2024   CBC  Component Value Date/Time   WBC 11.3 (H) 03/10/2024 1119   WBC 9.9 09/07/2021 1105   RBC 4.46 03/10/2024 1119   RBC 4.58 09/07/2021 1105   HGB 13.6 03/10/2024 1119   HGB 12.0 05/30/2015 0856   HCT 41.6 03/10/2024 1119   PLT 280 03/10/2024 1119   MCV 93 03/10/2024 1119   MCH 30.5 03/10/2024 1119   MCH 31.0 09/07/2021 1105   MCHC 32.7 03/10/2024 1119   MCHC 33.2 09/07/2021 1105   RDW 12.0 03/10/2024 1119   Iron Studies No results found for: IRON, TIBC, FERRITIN, IRONPCTSAT Lipid Panel     Component Value Date/Time    CHOL 149 06/21/2024 0858   TRIG 128 06/21/2024 0858   HDL 43 06/21/2024 0858   CHOLHDL 3.7 03/10/2024 1119   CHOLHDL 4.5 05/30/2015 0922   VLDL 59 (H) 05/30/2015 0922   LDLCALC 83 06/21/2024 0858   Hepatic Function Panel     Component Value Date/Time   PROT 6.1 06/21/2024 0858   ALBUMIN 3.9 06/21/2024 0858   AST 11 06/21/2024 0858   AST 14 (L) 08/30/2019 1143   ALT 5 06/21/2024 0858   ALT 12 08/30/2019 1143   ALKPHOS 103 06/21/2024 0858   BILITOT 0.3 06/21/2024 0858   BILITOT 0.3 08/30/2019 1143      Component Value Date/Time   TSH 4.260 03/10/2024 1119   Nutritional Lab Results  Component Value Date   VD25OH 59.2 06/21/2024   VD25OH 66.1 09/01/2023   VD25OH 69.2 04/07/2023     Assessment and Plan    Class 3 Obesity (BMI 40.0-44.9) She has maintained her weight since the last visit, which is positive given recent travel. She is mindful of protein intake, prioritizing it in her meals, and is working on increasing her intake of fruits and vegetables. She is engaging in physical activity with a stationary bike for 5-10 minutes, three days a week. She is also addressing emotional eating behaviors with medication. - Continue current dietary habits focusing on protein intake and increasing fruits and vegetables. - Continue Wellbutrin  and topiramate  for emotional eating behaviors.  Emotional eating behaviors associated with depression Emotional eating behaviors are being managed with Wellbutrin  and topiramate . She is currently on topiramate  50 mg once a day, with the option to increase to 100 mg if needed. She initially experienced headaches, which have since subsided. Most people find that the 100 mg dose tends to help headaches better than the 50 mg dose. - Continue Wellbutrin  and topiramate  as prescribed. - Consider increasing topiramate  to 100 mg if headaches persist or emotional eating behaviors are not adequately controlled.  Acute upper respiratory infection She is  experiencing symptoms consistent with an acute upper respiratory infection, including sore throat, congestion, and extreme fatigue. Differential diagnosis includes COVID-19, influenza, and other viral infections. - Advise rest and hydration. - Recommend over-the-counter cold medications for symptom relief as needed.  Vitamin D  deficiency She is being treated for vitamin D  deficiency with prescription vitamin D  (calcifediol). - Continue prescription vitamin D  (calcifediol) therapy.         Shineka was informed of the importance of frequent follow up visits to maximize her success with intensive lifestyle modifications for her obesity and obesity related health conditions as recommended by USPSTF and CMS guidelines   Louann Penton, MD

## 2024-09-08 ENCOUNTER — Other Ambulatory Visit: Payer: Self-pay | Admitting: *Deleted

## 2024-09-08 DIAGNOSIS — D72829 Elevated white blood cell count, unspecified: Secondary | ICD-10-CM

## 2024-09-08 DIAGNOSIS — R7989 Other specified abnormal findings of blood chemistry: Secondary | ICD-10-CM

## 2024-09-14 ENCOUNTER — Other Ambulatory Visit

## 2024-09-14 DIAGNOSIS — D72829 Elevated white blood cell count, unspecified: Secondary | ICD-10-CM

## 2024-09-14 DIAGNOSIS — R7989 Other specified abnormal findings of blood chemistry: Secondary | ICD-10-CM

## 2024-09-15 ENCOUNTER — Encounter (INDEPENDENT_AMBULATORY_CARE_PROVIDER_SITE_OTHER): Payer: Self-pay | Admitting: Family Medicine

## 2024-09-15 ENCOUNTER — Ambulatory Visit (INDEPENDENT_AMBULATORY_CARE_PROVIDER_SITE_OTHER): Admitting: Family Medicine

## 2024-09-15 ENCOUNTER — Ambulatory Visit: Payer: Self-pay | Admitting: Family Medicine

## 2024-09-15 VITALS — BP 127/95 | HR 94 | Temp 98.5°F | Ht 64.0 in | Wt 254.0 lb

## 2024-09-15 DIAGNOSIS — F5089 Other specified eating disorder: Secondary | ICD-10-CM

## 2024-09-15 DIAGNOSIS — R7303 Prediabetes: Secondary | ICD-10-CM | POA: Diagnosis not present

## 2024-09-15 DIAGNOSIS — F3289 Other specified depressive episodes: Secondary | ICD-10-CM

## 2024-09-15 DIAGNOSIS — Z6841 Body Mass Index (BMI) 40.0 and over, adult: Secondary | ICD-10-CM

## 2024-09-15 DIAGNOSIS — E66813 Obesity, class 3: Secondary | ICD-10-CM

## 2024-09-15 DIAGNOSIS — F32A Depression, unspecified: Secondary | ICD-10-CM | POA: Diagnosis not present

## 2024-09-15 DIAGNOSIS — I10 Essential (primary) hypertension: Secondary | ICD-10-CM | POA: Diagnosis not present

## 2024-09-15 DIAGNOSIS — E559 Vitamin D deficiency, unspecified: Secondary | ICD-10-CM

## 2024-09-15 LAB — CBC WITH DIFFERENTIAL/PLATELET
Basophils Absolute: 0.1 x10E3/uL (ref 0.0–0.2)
Basos: 1 %
EOS (ABSOLUTE): 0.3 x10E3/uL (ref 0.0–0.4)
Eos: 2 %
Hematocrit: 43.5 % (ref 34.0–46.6)
Hemoglobin: 14.4 g/dL (ref 11.1–15.9)
Immature Grans (Abs): 0.5 x10E3/uL — ABNORMAL HIGH (ref 0.0–0.1)
Immature Granulocytes: 5 %
Lymphocytes Absolute: 3 x10E3/uL (ref 0.7–3.1)
Lymphs: 26 %
MCH: 31.3 pg (ref 26.6–33.0)
MCHC: 33.1 g/dL (ref 31.5–35.7)
MCV: 95 fL (ref 79–97)
Monocytes Absolute: 0.8 x10E3/uL (ref 0.1–0.9)
Monocytes: 8 %
Neutrophils Absolute: 6.6 x10E3/uL (ref 1.4–7.0)
Neutrophils: 58 %
Platelets: 316 x10E3/uL (ref 150–450)
RBC: 4.6 x10E6/uL (ref 3.77–5.28)
RDW: 12.9 % (ref 11.7–15.4)
WBC: 11.2 x10E3/uL — ABNORMAL HIGH (ref 3.4–10.8)

## 2024-09-15 LAB — TSH: TSH: 3.22 u[IU]/mL (ref 0.450–4.500)

## 2024-09-15 MED ORDER — BUPROPION HCL ER (SR) 200 MG PO TB12: 200.0000 mg | ORAL_TABLET | Freq: Every morning | ORAL | 0 refills | Status: DC

## 2024-09-15 MED ORDER — METFORMIN HCL 500 MG PO TABS: 500.0000 mg | ORAL_TABLET | Freq: Two times a day (BID) | ORAL | 0 refills | Status: DC

## 2024-09-15 MED ORDER — TOPIRAMATE 50 MG PO TABS: 50.0000 mg | ORAL_TABLET | Freq: Two times a day (BID) | ORAL | 0 refills | Status: DC

## 2024-09-15 NOTE — Progress Notes (Signed)
 Office: [phone]  /  Fax: [phone]  WEIGHT SUMMARY AND BIOMETRICS  Anthropometric Measurements Height: 5' 4 (1.626 m) Weight: 254 lb (115.2 kg) BMI (Calculated): 43.58 Weight at Last Visit: 256 lb Weight Lost Since Last Visit: 2 lb Weight Gained Since Last Visit: 0 Starting Weight: 295 lb Total Weight Loss (lbs): 41 lb (18.6 kg) Peak Weight: 297 lb   Body Composition  Body Fat %: 48.4 % Fat Mass (lbs): 123.8 lbs Muscle Mass (lbs): 124.8 lbs Total Body Water (lbs): 89.6 lbs Visceral Fat Rating : 15   Other Clinical Data Fasting: yes Labs: no Today's Visit #: 46 Starting Date: 12/13/20    Chief Complaint: OBESITY    History of Present Illness Sarah Fields is a 48 year old female who presents with a recent severe allergic reaction.  Two weeks ago, she traveled out of town for work and stayed at an [employer]. After consuming food that she normally eats, she developed a severe allergic reaction characterized by pinpoint rashes and itching. She sought care at an emergency walk-in clinic and was prescribed a high dose of prednisone  for five days and took Benadryl every evening.  Following the allergic reaction, she experienced significant fatigue, which she attributes to the medications. The first week of treatment was manageable due to the sedative effects of Benadryl, but she has been feeling extremely tired since stopping prednisone  and Benadryl a week ago.  She suspects the allergic reaction might have been triggered by a mixed drink containing Red 40, Prosecco, and whipped cream vodka, which she tasted but did not consume in large quantities. She also considered the possibility of an allergic reaction to food consumed at [employer], specifically honey chicken, but found no unusual ingredients except ginger.  Her blood pressure has been erratic, particularly elevated in the evenings. She does not have a home blood pressure monitor but can feel when  her blood pressure is high. She used albuterol  three to four times over the past two to three weeks.  She is currently on metformin  for prediabetes, vitamin D , and Wellbutrin . She encountered issues with her metformin  prescription refill through [facility] but has not run out of the medication.      PHYSICAL EXAM:  Blood pressure (!) 127/95, pulse 94, temperature 98.5 F (36.9 C), height 5' 4 (1.626 m), weight 254 lb (115.2 kg), SpO2 98%. Body mass index is 43.6 kg/m.  DIAGNOSTIC DATA REVIEWED:  BMET    Component Value Date/Time   NA 139 06/21/2024 0858   K 4.7 06/21/2024 0858   CL 102 06/21/2024 0858   CO2 20 06/21/2024 0858   GLUCOSE 80 06/21/2024 0858   GLUCOSE 88 09/07/2021 1105   BUN 14 06/21/2024 0858   CREATININE 1.05 (H) 06/21/2024 0858   CREATININE 0.90 08/30/2019 1143   CREATININE 0.79 05/30/2015 0922   CALCIUM 9.1 06/21/2024 0858   GFRNONAA >60 09/07/2021 1105   GFRNONAA >60 08/30/2019 1143   GFRAA 80 12/13/2020 0942   GFRAA >60 08/30/2019 1143   Lab Results  Component Value Date   HGBA1C 5.4 06/21/2024   HGBA1C 5.5 05/30/2015   Lab Results  Component Value Date   INSULIN  14.2 06/21/2024   INSULIN  21.4 12/13/2020   Lab Results  Component Value Date   TSH 3.220 09/14/2024   CBC    Component Value Date/Time   WBC 11.2 (H) 09/14/2024 0855   WBC 9.9 09/07/2021 1105   RBC 4.60 09/14/2024 0855   RBC 4.58 09/07/2021 1105  HGB 14.4 09/14/2024 0855   HGB 12.0 05/30/2015 0856   HCT 43.5 09/14/2024 0855   PLT 316 09/14/2024 0855   MCV 95 09/14/2024 0855   MCH 31.3 09/14/2024 0855   MCH 31.0 09/07/2021 1105   MCHC 33.1 09/14/2024 0855   MCHC 33.2 09/07/2021 1105   RDW 12.9 09/14/2024 0855   Iron Studies No results found for: IRON, TIBC, FERRITIN, IRONPCTSAT Lipid Panel     Component Value Date/Time   CHOL 149 06/21/2024 0858   TRIG 128 06/21/2024 0858   HDL 43 06/21/2024 0858   CHOLHDL 3.7 03/10/2024 1119   CHOLHDL 4.5 05/30/2015 0922    VLDL 59 (H) 05/30/2015 0922   LDLCALC 83 06/21/2024 0858   Hepatic Function Panel     Component Value Date/Time   PROT 6.1 06/21/2024 0858   ALBUMIN 3.9 06/21/2024 0858   AST 11 06/21/2024 0858   AST 14 (L) 08/30/2019 1143   ALT 5 06/21/2024 0858   ALT 12 08/30/2019 1143   ALKPHOS 103 06/21/2024 0858   BILITOT 0.3 06/21/2024 0858   BILITOT 0.3 08/30/2019 1143      Component Value Date/Time   TSH 3.220 09/14/2024 0855   Nutritional Lab Results  Component Value Date   VD25OH 59.2 06/21/2024   VD25OH 66.1 09/01/2023   VD25OH 69.2 04/07/2023     Assessment and Plan Assessment & Plan Recent severe allergic reaction (anaphylaxis) Recent severe allergic reaction suspected to be anaphylaxis, possibly triggered by a mixed drink containing red 40, Prosecco, and whipped cream vodka. The reaction was life-threatening, requiring emergency intervention with high-dose prednisone  and Benadryl. The exact allergen is unclear, but she is under the care of an allergist to identify the trigger. - Continue follow-up with allergist to identify specific allergens - Advise carrying an EpiPen at all times  Essential hypertension Reports of elevated blood pressure, particularly in the evenings, possibly exacerbated by recent prednisone  use. Discussed the potential long-term risks of uncontrolled hypertension, including kidney damage due to pressure damaging the filtering cells, which cannot be repaired once damaged. - Advise obtaining a home blood pressure monitor - Instruct to check blood pressure at home twice a week - Review blood pressure readings at next visit to assess need for medication adjustments - Continue diet, exercise and weight loss as discussed today as an important part of the treatment plan   Prediabetes Continues on metformin  for management of prediabetes. Issues with prescription refills through [facility] noted, but no interruption in medication adherence reported. - Send  new prescription for metformin  to [facility], exercise and weight loss as discussed today as an important part of the treatment plan   Vitamin D  deficiency Continues on vitamin D  supplementation. -Will continue to monitor and check labs when appropriate  Depression with emotional eating behaviors Continues on Wellbutrin  for depression management and EEB, no SE noted - Continue current Wellbutrin  regimen, refilled today      Gwendloyn was informed of the importance of frequent follow up visits to maximize her success with intensive lifestyle modifications for her obesity and obesity related health conditions as recommended by USPSTF and CMS guidelines   Louann Penton, MD

## 2024-09-21 ENCOUNTER — Ambulatory Visit: Admitting: Family Medicine

## 2024-09-21 ENCOUNTER — Encounter: Payer: Self-pay | Admitting: Family Medicine

## 2024-09-21 VITALS — BP 134/88 | HR 94 | Ht 64.0 in | Wt 261.8 lb

## 2024-09-21 DIAGNOSIS — G43909 Migraine, unspecified, not intractable, without status migrainosus: Secondary | ICD-10-CM

## 2024-09-21 DIAGNOSIS — Z23 Encounter for immunization: Secondary | ICD-10-CM | POA: Diagnosis not present

## 2024-09-21 DIAGNOSIS — Z0000 Encounter for general adult medical examination without abnormal findings: Secondary | ICD-10-CM | POA: Diagnosis not present

## 2024-09-21 DIAGNOSIS — J4520 Mild intermittent asthma, uncomplicated: Secondary | ICD-10-CM | POA: Diagnosis not present

## 2024-09-21 DIAGNOSIS — E669 Obesity, unspecified: Secondary | ICD-10-CM

## 2024-09-21 NOTE — Progress Notes (Signed)
 Annual physical  Subjective    Patient ID: Sarah Fields, female    DOB: [birth year]  Age: 48 y.o. MRN: [id number]  Chief Complaint  Patient presents with   Annual Exam   HPI Sarah Fields is a 48 y.o. old female here  for annual exam.   Subjective - Here for physical. No specific concerns. - Reports an allergic reaction with hives earlier this month. Believes it was from something eaten. Has an allergy appointment scheduled for early December to investigate. Suspects Red 40 dye in a drink a coworker made was the cause, as it was the only new item consumed. Denies reaction to red meat. Visited [facility] a few days after the reaction started. Was prescribed an EpiPen but did not need to use it. Also took prednisone .  Medications [facility]ending [facility]. Current medications include: bupropion  once daily in the morning, metformin  500 mg twice daily without side effects like diarrhea, and topiramate  (Topamax ) which was increased by the wellness clinic for weight management. Also takes vitamin D  weekly. For allergies, takes montelukast  (Singulair ) daily during spring and fall, and cetirizine (Zyrtec) and fluticasone  (Flonase ) as needed. For migraines, takes magnesium , ondansetron  (Zofran ) for nausea, primidone  for tremors, and nurtec (Nurtec) and rizatriptan  (Maxalt ) as needed. Symbicort is prescribed as needed but has not been used in approximately 6 months and is likely no longer prescribed. Miralax  as needed for constipation. Also receives allergy shots.  PMH, PSH, FH, Social Hx PMHx: Allergic rhinitis, migraines, essential tremor, constipation, history of hives. PSHx: None mentioned. FHx: No family history of breast cancer. Social Hx: Denies tobacco or alcohol use. 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 Exercises occasionally, prefers walking, stationary bike, or elliptical.  ROS Constitutional: Denies current  concerns. Allergy/Immunology: Reports recent episode of hives. Seasonal allergies present, worse in spring and fall. Neuro: Reports migraines, managed with medication. Reports tremors. GI: No diarrhea with metformin . Takes Miralax  for constipation. GU: Reports regular menstrual periods.    The 10-year ASCVD risk score (Arnett DK, et al., 2019) is: 1.2%  Health Maintenance Due  Topic Date Due   Hepatitis B Vaccines 19-59 Average Risk (1 of 3 - 19+ 3-dose series) Never done   Influenza Vaccine  06/24/2024   COVID-19 Vaccine (5 - 2025-26 season) 07/25/2024      Objective:     BP 134/88   Pulse 94   Ht 5' 4 (1.626 m)   Wt 261 lb 12 oz (118.7 kg)   SpO2 99%   BMI 44.93 kg/m    Physical Exam Gen: alert, oriented HEENT: perrla, eomi, mmm CV: rrr, no murmur Pulm: lctab. No wheeze or crackles.  GI: soft, nbs.  Nontender to palpation MSK: strength equal b/l. Normal gait Ext: no pedal edema Skin: warm and dry, no rashes Psych: pleasant affect.  Spontaneous speech   No results found for any visits on 09/21/24.      Assessment & Plan:   Physical exam, annual  Encounter for immunization -     Pneumococcal conjugate vaccine 20-valent -     Varicella-zoster vaccine IM  Obesity, Beginning BMI 50.64 Assessment & Plan: Continue metformin , topamax , bupropion  and nutritionist follow ups.   Orders: -     Comprehensive metabolic panel with GFR; Future -     CBC with Differential/Platelet; Future -     Lipid panel; Future -     TSH; Future -     Hemoglobin A1c; Future  Mild intermittent asthma without complication Assessment & Plan: Continue albuterol  prn  Orders: -     CBC with Differential/Platelet; Future  Episodic migraine     Return in about 1 year (around 09/21/2025) for physical.    Toribio MARLA Slain, MD

## 2024-09-21 NOTE — Patient Instructions (Signed)
 It was nice to see you today,  We addressed the following topics today:  - You are due for the shingles vaccine and the updated pneumococcal vaccine. - We will administer the Shingrix and Pneumovax 20 vaccines today. You opted to get the flu shot at a later time. - You may experience an immune reaction to the vaccines, such as not feeling well, for a day or so. This is normal. - Continue your current medications as prescribed. - Continue follow-up with the [facility]. - Keep your scheduled appointment with the allergist in December.  Have a great day,  Rolan Slain, MD

## 2024-09-26 ENCOUNTER — Other Ambulatory Visit (INDEPENDENT_AMBULATORY_CARE_PROVIDER_SITE_OTHER): Payer: Self-pay | Admitting: Family Medicine

## 2024-09-26 DIAGNOSIS — K5909 Other constipation: Secondary | ICD-10-CM

## 2024-09-26 NOTE — Assessment & Plan Note (Signed)
 Continue nurtec and maxalt  prn

## 2024-09-26 NOTE — Assessment & Plan Note (Signed)
 Continue metformin , topamax , bupropion  and nutritionist follow ups.

## 2024-09-26 NOTE — Assessment & Plan Note (Signed)
 Continue albuterol prn

## 2024-09-28 ENCOUNTER — Other Ambulatory Visit: Payer: Self-pay | Admitting: Obstetrics and Gynecology

## 2024-09-28 NOTE — Telephone Encounter (Signed)
 Medication refill request: jencycla  0.35mg  Last AEX:  10-20-23 Next AEX: 10-26-24 Last MMG (if hormonal medication request): 04-05-24 birads 1:neg Refill authorized: please approve supply until aex if appropriate

## 2024-10-03 ENCOUNTER — Telehealth: Payer: Self-pay | Admitting: Pharmacist

## 2024-10-03 NOTE — Telephone Encounter (Signed)
 Pharmacy Patient Advocate Encounter   Received notification from Patient Pharmacy that prior authorization for Nurtec 75MG  dispersible tablets is required/requested.   Insurance verification completed.   The patient is insured through [employer].   Per test claim: PA required; PA submitted to above mentioned insurance via Latent Key/confirmation #/EOC [id number] Status is pending

## 2024-10-03 NOTE — Telephone Encounter (Signed)
 Pharmacy Patient Advocate Encounter  Received notification from [employer] that Prior Authorization for NURTEC 75 MG PO TBDP has been APPROVED from 10/03/2024 to 10/03/2025   PA #/Case ID/Reference #: [id number]

## 2024-10-10 ENCOUNTER — Other Ambulatory Visit (INDEPENDENT_AMBULATORY_CARE_PROVIDER_SITE_OTHER): Payer: Self-pay | Admitting: Family Medicine

## 2024-10-13 ENCOUNTER — Ambulatory Visit (INDEPENDENT_AMBULATORY_CARE_PROVIDER_SITE_OTHER): Payer: Self-pay | Admitting: Family Medicine

## 2024-10-13 ENCOUNTER — Encounter (INDEPENDENT_AMBULATORY_CARE_PROVIDER_SITE_OTHER): Payer: Self-pay | Admitting: Family Medicine

## 2024-10-13 VITALS — BP 138/86 | HR 98 | Temp 98.3°F | Ht 64.0 in | Wt 254.0 lb

## 2024-10-13 DIAGNOSIS — F5089 Other specified eating disorder: Secondary | ICD-10-CM

## 2024-10-13 DIAGNOSIS — E559 Vitamin D deficiency, unspecified: Secondary | ICD-10-CM

## 2024-10-13 DIAGNOSIS — E669 Obesity, unspecified: Secondary | ICD-10-CM

## 2024-10-13 DIAGNOSIS — Z6841 Body Mass Index (BMI) 40.0 and over, adult: Secondary | ICD-10-CM

## 2024-10-13 DIAGNOSIS — F3289 Other specified depressive episodes: Secondary | ICD-10-CM

## 2024-10-13 DIAGNOSIS — R7303 Prediabetes: Secondary | ICD-10-CM

## 2024-10-13 MED ORDER — TOPIRAMATE 50 MG PO TABS: 50.0000 mg | ORAL_TABLET | Freq: Two times a day (BID) | ORAL | 0 refills | Status: DC

## 2024-10-13 MED ORDER — METFORMIN HCL 500 MG PO TABS: 500.0000 mg | ORAL_TABLET | Freq: Two times a day (BID) | ORAL | 0 refills | Status: DC

## 2024-10-13 MED ORDER — BUPROPION HCL ER (SR) 200 MG PO TB12: 200.0000 mg | ORAL_TABLET | Freq: Every morning | ORAL | 0 refills | Status: DC

## 2024-10-13 MED ORDER — VITAMIN D (ERGOCALCIFEROL) 1.25 MG (50000 UNIT) PO CAPS: 50000.0000 [IU] | ORAL_CAPSULE | ORAL | 0 refills | Status: DC

## 2024-10-13 NOTE — Progress Notes (Signed)
 Office: [phone]  /  Fax: [phone]  WEIGHT SUMMARY AND BIOMETRICS  Anthropometric Measurements Height: 5' 4 (1.626 m) Weight: 254 lb (115.2 kg) BMI (Calculated): 43.58 Weight at Last Visit: 254 lb Weight Lost Since Last Visit: 0 Weight Gained Since Last Visit: 0 Starting Weight: 295 lb Total Weight Loss (lbs): 41 lb (18.6 kg) Peak Weight: 297 lb   Body Composition  Body Fat %: 49.1 % Fat Mass (lbs): 125 lbs Muscle Mass (lbs): 123 lbs Total Body Water (lbs): 91 lbs Visceral Fat Rating : 16   Other Clinical Data Fasting: yes Labs: no Today's Visit #: 58 Starting Date: 12/13/20    Chief Complaint: OBESITY  History of Present Illness Sarah Fields is a 48 year old female with obesity and prediabetes who presents for obesity treatment plan assessment and progress evaluation.  She is following a category three eating plan but adheres to it only about sixty percent of the time. She struggles with consuming adequate fruits, vegetables, and protein, and often skips meals. Despite these challenges, she has maintained her weight over the past month. She is not currently exercising and reports inadequate hydration. She is getting seven to nine hours of sleep per night.  She is being treated for vitamin D  deficiency with ergocalciferol  50,000 IU weekly and requests a refill. She is also on Wellbutrin  SR 200 mg daily for emotional eating behaviors and reports stability on this medication, requesting a refill. Additionally, she is taking topiramate  50 mg twice a day for emotional eating behaviors and requests a refill.  She has prediabetes and is taking metformin  500 mg twice a day, for which she requests a refill.  She describes the past month as a 'weird blur' due to work-related stress, impacting her meal planning and preparation. She often finds herself not eating until late at night, sometimes only consuming chicken. She lacks a desire to eat and attributes  some of this to changes in her routine and stress levels. She has been mindful of not resorting to unhealthy snacks despite these challenges.      PHYSICAL EXAM:  Blood pressure 138/86, pulse 98, temperature 98.3 F (36.8 C), height 5' 4 (1.626 m), weight 254 lb (115.2 kg), SpO2 97%. Body mass index is 43.6 kg/m.  DIAGNOSTIC DATA REVIEWED:  BMET    Component Value Date/Time   NA 139 06/21/2024 0858   K 4.7 06/21/2024 0858   CL 102 06/21/2024 0858   CO2 20 06/21/2024 0858   GLUCOSE 80 06/21/2024 0858   GLUCOSE 88 09/07/2021 1105   BUN 14 06/21/2024 0858   CREATININE 1.05 (H) 06/21/2024 0858   CREATININE 0.90 08/30/2019 1143   CREATININE 0.79 05/30/2015 0922   CALCIUM 9.1 06/21/2024 0858   GFRNONAA >60 09/07/2021 1105   GFRNONAA >60 08/30/2019 1143   GFRAA 80 12/13/2020 0942   GFRAA >60 08/30/2019 1143   Lab Results  Component Value Date   HGBA1C 5.4 06/21/2024   HGBA1C 5.5 05/30/2015   Lab Results  Component Value Date   INSULIN  14.2 06/21/2024   INSULIN  21.4 12/13/2020   Lab Results  Component Value Date   TSH 3.220 09/14/2024   CBC    Component Value Date/Time   WBC 11.2 (H) 09/14/2024 0855   WBC 9.9 09/07/2021 1105   RBC 4.60 09/14/2024 0855   RBC 4.58 09/07/2021 1105   HGB 14.4 09/14/2024 0855   HGB 12.0 05/30/2015 0856   HCT 43.5 09/14/2024 0855   PLT 316 09/14/2024 0855  MCV 95 09/14/2024 0855   MCH 31.3 09/14/2024 0855   MCH 31.0 09/07/2021 1105   MCHC 33.1 09/14/2024 0855   MCHC 33.2 09/07/2021 1105   RDW 12.9 09/14/2024 0855   Iron Studies No results found for: IRON, TIBC, FERRITIN, IRONPCTSAT Lipid Panel     Component Value Date/Time   CHOL 149 06/21/2024 0858   TRIG 128 06/21/2024 0858   HDL 43 06/21/2024 0858   CHOLHDL 3.7 03/10/2024 1119   CHOLHDL 4.5 05/30/2015 0922   VLDL 59 (H) 05/30/2015 0922   LDLCALC 83 06/21/2024 0858   Hepatic Function Panel     Component Value Date/Time   PROT 6.1 06/21/2024 0858   ALBUMIN  3.9 06/21/2024 0858   AST 11 06/21/2024 0858   AST 14 (L) 08/30/2019 1143   ALT 5 06/21/2024 0858   ALT 12 08/30/2019 1143   ALKPHOS 103 06/21/2024 0858   BILITOT 0.3 06/21/2024 0858   BILITOT 0.3 08/30/2019 1143      Component Value Date/Time   TSH 3.220 09/14/2024 0855   Nutritional Lab Results  Component Value Date   VD25OH 59.2 06/21/2024   VD25OH 66.1 09/01/2023   VD25OH 69.2 04/07/2023     Assessment and Plan Assessment & Plan Obesity Management is ongoing with a focus on dietary adherence. She follows the category three eating plan approximately 60% of the time, struggles with fruit and vegetable intake, and does not meet recommended protein levels. She maintains weight over the last month despite challenges with meal skipping and inadequate hydration. No current exercise regimen. - Continue category three eating plan - Encouraged increased intake of fruits, vegetables, and protein - Encouraged adequate hydration - Discussed strategies for meal planning during holidays, including taking protein home from Thanksgiving - Encouraged use of microwave meals for convenience  Prediabetes Managed with diet, exercise, and weight loss. She is on metformin  500 mg twice daily. - Continue metformin  500 mg twice daily - Encouraged diet and exercise for weight loss  Vitamin D  deficiency Managed with prescription ergocalciferol  50,000 IU weekly. She requests a refill. - Refilled ergocalciferol  50,000 IU weekly  Emotional eating behaviors Managed with Wellbutrin  SR 200 mg daily and topiramate  50 mg twice daily. She is stable on these medications and requests refills. - Refilled Wellbutrin  SR 200 mg daily - Refilled topiramate  50 mg twice daily      Patients who are on anti-obesity medications are counseled on the importance of maintaining healthy lifestyle habits, including balanced nutrition, regular physical activity, and behavioral modifications,  Medication is an adjunct  to, not a replacement for, lifestyle changes and that the long-term success and weight maintenance depend on continued adherence to these strategies.   Sarah Fields was informed of the importance of frequent follow up visits to maximize her success with intensive lifestyle modifications for her obesity and obesity related health conditions as recommended by USPSTF and CMS guidelines  Louann Penton, MD

## 2024-10-26 ENCOUNTER — Encounter: Payer: Self-pay | Admitting: Obstetrics and Gynecology

## 2024-10-26 ENCOUNTER — Ambulatory Visit (INDEPENDENT_AMBULATORY_CARE_PROVIDER_SITE_OTHER): Payer: Commercial Managed Care - HMO | Admitting: Obstetrics and Gynecology

## 2024-10-26 VITALS — BP 120/84 | HR 99 | Ht 65.5 in | Wt 258.0 lb

## 2024-10-26 DIAGNOSIS — Z01419 Encounter for gynecological examination (general) (routine) without abnormal findings: Secondary | ICD-10-CM

## 2024-10-26 DIAGNOSIS — Z1331 Encounter for screening for depression: Secondary | ICD-10-CM | POA: Diagnosis not present

## 2024-10-26 MED ORDER — NORETHINDRONE 0.35 MG PO TABS: 1.0000 | ORAL_TABLET | Freq: Every day | ORAL | 3 refills | Status: AC

## 2024-10-26 NOTE — Progress Notes (Signed)
 48 y.o. G100P2002 Divorced Caucasian female here for annual exam.    Using progesterone only birth control pills to control heavy menstrual flow.  It is working well.  Last menses was in October.  Had 8 menses in the last year.   Having hot flashes and night sweats.    Has migraines.   Waiting to her if a grant is approved, which could affect patient's work status.  PCP: Chandra Toribio POUR, MD   No LMP recorded. (Menstrual status: Oral contraceptives).     Period Cycle (Days):  (irrgeluar - OCP) Period Duration (Days): 7 Period Pattern: (!) Irregular Menstrual Flow: Moderate Menstrual Control: Maxi pad Dysmenorrhea: (!) Mild     Sexually active: No.  The current method of family planning is tubal ligation.    Menopausal hormone therapy:  n/a Exercising: No.   Smoker:  no  OB History  Gravida Para Term Preterm AB Living  2 2 2   2   SAB IAB Ectopic Multiple Live Births          # Outcome Date GA Lbr Len/2nd Weight Sex Type Anes PTL Lv  2 Term           1 Term              HEALTH MAINTENANCE: Last 2 paps:  10/15/22 neg HR HPV neg, 07/02/17 neg HPV neg  History of abnormal Pap or positive HPV:  yes, 2000. Hx of colposcopy but no treatment.  Mammogram:   04/05/24 Breast Density cat B, BIRADS Cat 1 neg  Colonoscopy:  09/21/23 neg - Cologuard  Bone Density:  n/a  Result  n/a   Immunization History  Administered Date(s) Administered   INFLUENZA, HIGH DOSE SEASONAL PF 10/22/2021   Influenza, Quadrivalent, Recombinant, Inj, Pf 07/19/2019   Influenza, Seasonal, Injecte, Preservative Fre 08/31/2023   Influenza,inj,Quad PF,6+ Mos 08/21/2021, 08/22/2022   Influenza,inj,quad, With Preservative 08/04/2017   Influenza-Unspecified 10/24/2013, 09/19/2016, 07/22/2017, 10/26/2018   Moderna Covid-19 Vaccine Bivalent Booster 77yrs & up 11/19/2021   Moderna Sars-Covid-2 Vaccination 03/18/2020, 04/15/2020   PNEUMOCOCCAL CONJUGATE-20 09/21/2024   Pneumococcal Polysaccharide-23 12/07/2017    Tdap 05/19/2011, 08/21/2021   Zoster Recombinant(Shingrix ) 09/21/2024      reports that she has never smoked. She has been exposed to tobacco smoke. She has never used smokeless tobacco. She reports that she does not drink alcohol and does not use drugs.  Past Medical History:  Diagnosis Date   Allergy    Anxiety    Asthma    Broken foot    Depression    Essential tremor    Gestational diabetes    h/o   History of COVID-19 04/24/2021   Joint pain    Migraines    with aura   Other fatigue    Shortness of breath    Shortness of breath on exertion    Tenosynovitis, de Quervain    Vitamin D  deficiency     Past Surgical History:  Procedure Laterality Date   CESAREAN SECTION     2001, 2003   KNEE SURGERY Bilateral    arthroscopic   TUBAL LIGATION     WISDOM TOOTH EXTRACTION      Current Outpatient Medications  Medication Sig Dispense Refill   albuterol  (PROVENTIL  HFA;VENTOLIN  HFA) 108 (90 BASE) MCG/ACT inhaler Inhale 2 puffs into the lungs every 6 (six) hours as needed for wheezing or shortness of breath.      Azelastine-Fluticasone  137-50 MCG/ACT SUSP Place 1 spray into both nostrils 2 (  two) times daily.     buPROPion  (WELLBUTRIN  SR) 200 MG 12 hr tablet Take 1 tablet (200 mg total) by mouth in the morning. 90 tablet 0   EPINEPHrine 0.3 mg/0.3 mL IJ SOAJ injection INJECT IN OUTER THIGH AS NEEDED FOR ANAPHYLAXIS     fluticasone  (FLONASE ) 50 MCG/ACT nasal spray Place 2 sprays into both nostrils daily.     JENCYCLA  0.35 MG tablet Take 1 tablet by mouth daily. 84 tablet 0   levocetirizine (XYZAL) 5 MG tablet SMARTSIG:1 Tablet(s) By Mouth Every Evening     Magnesium  400 MG CAPS Take 1 tablet by mouth daily.     metFORMIN  (GLUCOPHAGE ) 500 MG tablet Take 1 tablet (500 mg total) by mouth 2 (two) times daily with a meal. 60 tablet 0   montelukast  (SINGULAIR ) 10 MG tablet Take 10 mg by mouth at bedtime.      nystatin  (MYCOSTATIN /NYSTOP ) powder Apply 1 Application topically 3  (three) times daily. Apply to skin of the affected area for up to 7 days 30 g 2   ondansetron  (ZOFRAN -ODT) 8 MG disintegrating tablet Take 1 tablet (8 mg total) by mouth every 8 (eight) hours as needed. 20 tablet 11   polyethylene glycol (MIRALAX  / GLYCOLAX ) 17 g packet Take 17 g by mouth daily. With increased water intake. 90 packet 0   primidone  (MYSOLINE ) 50 MG tablet TAKE 1 TABLET BY MOUTH EVERYDAY AT BEDTIME 90 tablet 4   Rimegepant Sulfate (NURTEC) 75 MG TBDP Take 1 tablet (75 mg total) by mouth daily as needed. For migraines. Take as close to onset of migraine as possible. One daily maximum. 10 tablet 11   rizatriptan  (MAXALT ) 10 MG tablet Take 1 tablet (10 mg total) by mouth as needed for migraine. TAKE 1 TABLET BY MOUTH AS NEEDED FOR MIGRAINE. MAY REPEAT IN 2 HOURS IF NEEDED 10 tablet 5   sodium chloride  (OCEAN) 0.65 % SOLN nasal spray Place 1 spray into both nostrils as needed for congestion.  0   topiramate  (TOPAMAX ) 50 MG tablet Take 1 tablet (50 mg total) by mouth 2 (two) times daily. 60 tablet 0   Vitamin D , Ergocalciferol , (DRISDOL ) 1.25 MG (50000 UNIT) CAPS capsule Take 1 capsule (50,000 Units total) by mouth every 7 (seven) days. 15 capsule 0   SYMBICORT 160-4.5 MCG/ACT inhaler  (Patient not taking: Reported on 10/26/2024)     No current facility-administered medications for this visit.    ALLERGIES: Banana, Hydrocodone , Latex, Other, Penicillins, Pineapple, Propranolol , Strawberry extract, Adhesive [tape], and Bactrim [sulfamethoxazole-trimethoprim]  Family History  Problem Relation Age of Onset   Hyperlipidemia Mother    Migraines Mother    Thyroid  disease Mother    Glaucoma Mother    Osteoporosis Mother    Anxiety disorder Mother    Seizures Father    Hypertension Father    Cancer Father 42       Prostate Ca   Glaucoma Father    Stroke Maternal Grandmother    Cancer Maternal Grandfather    Migraines Maternal Grandfather    Stroke Paternal Grandfather    Tremor Neg  Hx     Review of Systems  All other systems reviewed and are negative.   PHYSICAL EXAM:  BP 120/84 (BP Location: Left Arm, Patient Position: Sitting)   Pulse 99   Ht 5' 5.5 (1.664 m)   Wt 258 lb (117 kg)   SpO2 97%   BMI 42.28 kg/m     General appearance: alert, cooperative and appears stated age  Head: normocephalic, without obvious abnormality, atraumatic Neck: no adenopathy, supple, symmetrical, trachea midline and thyroid  normal to inspection and palpation Lungs: clear to auscultation bilaterally Breasts: normal appearance, no masses or tenderness, No nipple retraction or dimpling, No nipple discharge or bleeding, No axillary adenopathy Heart: regular rate and rhythm Abdomen: soft, non-tender; no masses, no organomegaly Extremities: extremities normal, atraumatic, no cyanosis or edema Skin: skin color, texture, turgor normal. No rashes or lesions Lymph nodes: cervical, supraclavicular, and axillary nodes normal. Neurologic: grossly normal  Pelvic: External genitalia:  no lesions              No abnormal inguinal nodes palpated.              Urethra:  normal appearing urethra with no masses, tenderness or lesions              Bartholins and Skenes: normal                 Vagina: normal appearing vagina with normal color and discharge, no lesions              Cervix: no lesions              Pap taken: no Bimanual Exam:  Uterus:  normal size, contour, position, consistency, mobility, non-tender              Adnexa: no mass, fullness, tenderness              Rectal exam: yes.  Confirms.              Anus:  normal sphincter tone, no lesions  Chaperone was present for exam:  Kari HERO, CMA  ASSESSMENT: Well woman visit with gynecologic exam. Status post BTL.  On Micronor  for menorrhagia.  Menstrual migraine without status migrainosus, intractable.  Migraine with aura.  Patient may be perimenopausal.  Hx C/S x 2.   PHQ-2-9: 0  PLAN: Mammogram screening discussed. Self  breast awareness reviewed. Pap and HRV collected:  no.  Due in 2028.  Guidelines for Calcium, Vitamin D , regular exercise program including cardiovascular and weight bearing exercise. Medication refills:  Micronor  3 packs and 3 refills.  Labs with PCP and [facility].  Follow up:  yearly and prn.

## 2024-10-26 NOTE — Patient Instructions (Signed)

## 2024-10-27 ENCOUNTER — Other Ambulatory Visit (INDEPENDENT_AMBULATORY_CARE_PROVIDER_SITE_OTHER): Payer: Self-pay | Admitting: Family Medicine

## 2024-11-23 ENCOUNTER — Ambulatory Visit (INDEPENDENT_AMBULATORY_CARE_PROVIDER_SITE_OTHER): Payer: Self-pay | Admitting: Family Medicine

## 2024-11-23 ENCOUNTER — Encounter (INDEPENDENT_AMBULATORY_CARE_PROVIDER_SITE_OTHER): Payer: Self-pay | Admitting: Family Medicine

## 2024-11-23 VITALS — BP 139/89 | HR 96 | Temp 98.9°F | Ht 64.0 in | Wt 251.0 lb

## 2024-11-23 DIAGNOSIS — Z6841 Body Mass Index (BMI) 40.0 and over, adult: Secondary | ICD-10-CM

## 2024-11-23 DIAGNOSIS — F5089 Other specified eating disorder: Secondary | ICD-10-CM | POA: Diagnosis not present

## 2024-11-23 DIAGNOSIS — R7303 Prediabetes: Secondary | ICD-10-CM | POA: Diagnosis not present

## 2024-11-23 DIAGNOSIS — E669 Obesity, unspecified: Secondary | ICD-10-CM | POA: Diagnosis not present

## 2024-11-23 DIAGNOSIS — E88819 Insulin resistance, unspecified: Secondary | ICD-10-CM

## 2024-11-23 DIAGNOSIS — E559 Vitamin D deficiency, unspecified: Secondary | ICD-10-CM | POA: Diagnosis not present

## 2024-11-23 DIAGNOSIS — F3289 Other specified depressive episodes: Secondary | ICD-10-CM

## 2024-11-23 MED ORDER — METFORMIN HCL 500 MG PO TABS: 500.0000 mg | ORAL_TABLET | Freq: Two times a day (BID) | ORAL | 0 refills | Status: AC

## 2024-11-23 MED ORDER — VITAMIN D (ERGOCALCIFEROL) 1.25 MG (50000 UNIT) PO CAPS: 50000.0000 [IU] | ORAL_CAPSULE | ORAL | 0 refills | Status: AC

## 2024-11-23 MED ORDER — TOPIRAMATE 50 MG PO TABS: 50.0000 mg | ORAL_TABLET | Freq: Two times a day (BID) | ORAL | 0 refills | Status: AC

## 2024-11-23 MED ORDER — BUPROPION HCL ER (SR) 200 MG PO TB12: 200.0000 mg | ORAL_TABLET | Freq: Every morning | ORAL | 0 refills | Status: AC

## 2024-11-23 NOTE — Progress Notes (Signed)
 "  Office: [phone]  /  Fax: [phone]  WEIGHT SUMMARY AND BIOMETRICS  Anthropometric Measurements Height: 5' 4 (1.626 m) Weight: 251 lb (113.9 kg) BMI (Calculated): 43.06 Weight at Last Visit: 254lb Weight Lost Since Last Visit: 3lb Weight Gained Since Last Visit: 0lb Starting Weight: 295lb Total Weight Loss (lbs): 44 lb (20 kg) Peak Weight: 297lb   Body Composition  Body Fat %: 48.7 % Fat Mass (lbs): 122.4 lbs Muscle Mass (lbs): 122.4 lbs Total Body Water (lbs): 89.2 lbs Visceral Fat Rating : 15   Other Clinical Data Fasting: No Labs: No Today's Visit #: 33 Starting Date: 12/13/20    Chief Complaint: OBESITY   Discussed the use of AI scribe software for clinical note transcription with the patient, who gave verbal consent to proceed.  History of Present Illness Sarah Fields is a 48 year old female with obesity and prediabetes who presents for obesity treatment and progress assessment.  She has been following the category three eating plan about fifty percent of the time and is working on improving her hydration. She struggles with consuming the recommended amount of protein and increasing her intake of fruits and vegetables. She is not currently engaging in any exercise. Despite these challenges, she has lost three pounds over the last six weeks, including during the holiday season.  She is being treated for emotional eating behaviors and is currently taking Wellbutrin  SR 200 mg in the morning and topiramate  50 mg twice a day. She requests refills for both medications.  She is also being treated for vitamin D  deficiency with prescription ergocalciferol  50,000 IU per week and requests a refill.  She has prediabetes and is taking metformin  500 mg twice a day, for which she requests a refill. She experiences nausea approximately 30 minutes after taking her morning medications, which she suspects may be related to taking them on an empty stomach.  Consuming a peppermint helps alleviate the nausea.  She experienced significant stress related to job security issues, which affected her eating habits. During this period, she did not eat for two days due to stress but avoided stress eating. Her appetite was suppressed, and she had no desire to eat, even when presented with tempting foods. She plans to resume meal planning and grocery shopping now that her job situation has stabilized.      PHYSICAL EXAM:  Blood pressure 139/89, pulse 96, temperature 98.9 F (37.2 C), height 5' 4 (1.626 m), weight 251 lb (113.9 kg), SpO2 98%. Body mass index is 43.08 kg/m.  DIAGNOSTIC DATA REVIEWED:  BMET    Component Value Date/Time   NA 139 06/21/2024 0858   K 4.7 06/21/2024 0858   CL 102 06/21/2024 0858   CO2 20 06/21/2024 0858   GLUCOSE 80 06/21/2024 0858   GLUCOSE 88 09/07/2021 1105   BUN 14 06/21/2024 0858   CREATININE 1.05 (H) 06/21/2024 0858   CREATININE 0.90 08/30/2019 1143   CREATININE 0.79 05/30/2015 0922   CALCIUM 9.1 06/21/2024 0858   GFRNONAA >60 09/07/2021 1105   GFRNONAA >60 08/30/2019 1143   GFRAA 80 12/13/2020 0942   GFRAA >60 08/30/2019 1143   Lab Results  Component Value Date   HGBA1C 5.4 06/21/2024   HGBA1C 5.5 05/30/2015   Lab Results  Component Value Date   INSULIN  14.2 06/21/2024   INSULIN  21.4 12/13/2020   Lab Results  Component Value Date   TSH 3.220 09/14/2024   CBC    Component Value Date/Time   WBC 11.2 (H)  09/14/2024 0855   WBC 9.9 09/07/2021 1105   RBC 4.60 09/14/2024 0855   RBC 4.58 09/07/2021 1105   HGB 14.4 09/14/2024 0855   HGB 12.0 05/30/2015 0856   HCT 43.5 09/14/2024 0855   PLT 316 09/14/2024 0855   MCV 95 09/14/2024 0855   MCH 31.3 09/14/2024 0855   MCH 31.0 09/07/2021 1105   MCHC 33.1 09/14/2024 0855   MCHC 33.2 09/07/2021 1105   RDW 12.9 09/14/2024 0855   Iron Studies No results found for: IRON, TIBC, FERRITIN, IRONPCTSAT Lipid Panel     Component Value Date/Time    CHOL 149 06/21/2024 0858   TRIG 128 06/21/2024 0858   HDL 43 06/21/2024 0858   CHOLHDL 3.7 03/10/2024 1119   CHOLHDL 4.5 05/30/2015 0922   VLDL 59 (H) 05/30/2015 0922   LDLCALC 83 06/21/2024 0858   Hepatic Function Panel     Component Value Date/Time   PROT 6.1 06/21/2024 0858   ALBUMIN 3.9 06/21/2024 0858   AST 11 06/21/2024 0858   AST 14 (L) 08/30/2019 1143   ALT 5 06/21/2024 0858   ALT 12 08/30/2019 1143   ALKPHOS 103 06/21/2024 0858   BILITOT 0.3 06/21/2024 0858   BILITOT 0.3 08/30/2019 1143      Component Value Date/Time   TSH 3.220 09/14/2024 0855   Nutritional Lab Results  Component Value Date   VD25OH 59.2 06/21/2024   VD25OH 66.1 09/01/2023   VD25OH 69.2 04/07/2023     Assessment and Plan Assessment & Plan Obesity Management includes dietary modifications and medication. She has lost 3 pounds in the last six weeks, including over Thanksgiving and Christmas, which is impressive. She is following the category three eating plan about 50% of the time, struggling with protein intake and increasing fruits and vegetables. Not currently exercising. - Continue category three eating plan - Encouraged adequate hydration - Encouraged increased protein intake and fruits and vegetables  Emotional eating behaviors Managed with Wellbutrin  SR and topiramate . She requests refills for both medications. - Continue Wellbutrin  SR 200 mg in the morning - Continue topiramate  50 mg twice a day - Refilled Wellbutrin  SR and topiramate  prescriptions  Prediabetes Managed with diet, exercise, and weight loss. Metformin  is prescribed to aid in management. She requests a refill for metformin . Reports nausea after taking metformin  on an empty stomach, which is common. - Continue metformin  500 mg twice a day - Refilled metformin  prescription - Advised taking metformin  with breakfast to reduce nausea - Continue diet, exercise and weight loss as discussed today as an important part of  the treatment plan - Labs today   Vitamin D  deficiency Managed with prescription ergocalciferol . She requests a refill. - Continue ergocalciferol  50,000 IU weekly - Refilled ergocalciferol  prescription - Labs today  Emotional Eating Behaviors Managed with Wellbutrin  SR. She reports nausea after taking medications, which may be related to taking them on an empty stomach. - Continue Wellbutrin  SR 200 mg in the morning - Advised taking medications with breakfast to reduce nausea      Patients who are on anti-obesity medications are counseled on the importance of maintaining healthy lifestyle habits, including balanced nutrition, regular physical activity, and behavioral modifications,  Medication is an adjunct to, not a replacement for, lifestyle changes and that the long-term success and weight maintenance depend on continued adherence to these strategies.   Gwyn was informed of the importance of frequent follow up visits to maximize her success with intensive lifestyle modifications for her obesity and obesity related health  conditions as recommended by USPSTF and CMS guidelines  Louann Penton, MD   "

## 2024-11-24 LAB — INSULIN, RANDOM: INSULIN: 11.9 u[IU]/mL (ref 2.6–24.9)

## 2024-11-24 LAB — HEMOGLOBIN A1C
Est. average glucose Bld gHb Est-mCnc: 105 mg/dL
Hgb A1c MFr Bld: 5.3 % (ref 4.8–5.6)

## 2024-11-24 LAB — CMP14+EGFR
ALT: 11 IU/L (ref 0–32)
AST: 12 IU/L (ref 0–40)
Albumin: 4.1 g/dL (ref 3.9–4.9)
Alkaline Phosphatase: 120 IU/L — ABNORMAL HIGH (ref 41–116)
BUN/Creatinine Ratio: 12 (ref 9–23)
BUN: 13 mg/dL (ref 6–24)
Bilirubin Total: 0.2 mg/dL (ref 0.0–1.2)
CO2: 21 mmol/L (ref 20–29)
Calcium: 9.3 mg/dL (ref 8.7–10.2)
Chloride: 105 mmol/L (ref 96–106)
Creatinine, Ser: 1.06 mg/dL — ABNORMAL HIGH (ref 0.57–1.00)
Globulin, Total: 2.5 g/dL (ref 1.5–4.5)
Glucose: 82 mg/dL (ref 70–99)
Potassium: 4.8 mmol/L (ref 3.5–5.2)
Sodium: 139 mmol/L (ref 134–144)
Total Protein: 6.6 g/dL (ref 6.0–8.5)
eGFR: 64 mL/min/1.73

## 2024-11-24 LAB — VITAMIN B12: Vitamin B-12: 366 pg/mL (ref 232–1245)

## 2024-11-24 LAB — VITAMIN D 25 HYDROXY (VIT D DEFICIENCY, FRACTURES): Vit D, 25-Hydroxy: 58.1 ng/mL (ref 30.0–100.0)

## 2024-11-25 ENCOUNTER — Ambulatory Visit: Admitting: Family Medicine

## 2024-11-25 VITALS — BP 124/80 | HR 97 | Temp 98.9°F | Ht 64.0 in | Wt 255.1 lb

## 2024-11-25 DIAGNOSIS — Z23 Encounter for immunization: Secondary | ICD-10-CM

## 2024-11-26 NOTE — Progress Notes (Signed)
 Getting shingles vaccination.  Bp was elevated on initial check. Improved on repeat

## 2024-12-19 ENCOUNTER — Encounter (INDEPENDENT_AMBULATORY_CARE_PROVIDER_SITE_OTHER): Payer: Self-pay | Admitting: Family Medicine

## 2024-12-21 ENCOUNTER — Ambulatory Visit (INDEPENDENT_AMBULATORY_CARE_PROVIDER_SITE_OTHER): Payer: Self-pay | Admitting: Family Medicine

## 2024-12-24 ENCOUNTER — Other Ambulatory Visit (INDEPENDENT_AMBULATORY_CARE_PROVIDER_SITE_OTHER): Payer: Self-pay | Admitting: Family Medicine

## 2024-12-24 DIAGNOSIS — R7303 Prediabetes: Secondary | ICD-10-CM

## 2025-01-11 ENCOUNTER — Ambulatory Visit (INDEPENDENT_AMBULATORY_CARE_PROVIDER_SITE_OTHER): Admitting: Family Medicine

## 2025-01-18 ENCOUNTER — Ambulatory Visit (INDEPENDENT_AMBULATORY_CARE_PROVIDER_SITE_OTHER): Admitting: Family Medicine

## 2025-02-14 ENCOUNTER — Ambulatory Visit: Admitting: Adult Health

## 2025-09-19 ENCOUNTER — Other Ambulatory Visit

## 2025-09-26 ENCOUNTER — Encounter: Admitting: Family Medicine

## 2025-10-31 ENCOUNTER — Ambulatory Visit: Admitting: Obstetrics and Gynecology
